# Patient Record
Sex: Female | Born: 1960 | Race: White | Hispanic: No | Marital: Single | Smoking: Former smoker
Health system: Southern US, Community
[De-identification: ages and names within clinical notes are randomized; demographics above are authoritative.]

## PROBLEM LIST (undated history)

## (undated) DIAGNOSIS — I2510 Atherosclerotic heart disease of native coronary artery without angina pectoris: Secondary | ICD-10-CM

## (undated) DIAGNOSIS — I10 Essential (primary) hypertension: Secondary | ICD-10-CM

## (undated) DIAGNOSIS — T7840XA Allergy, unspecified, initial encounter: Secondary | ICD-10-CM

## (undated) DIAGNOSIS — I700 Atherosclerosis of aorta: Secondary | ICD-10-CM

## (undated) DIAGNOSIS — J45909 Unspecified asthma, uncomplicated: Secondary | ICD-10-CM

## (undated) DIAGNOSIS — K219 Gastro-esophageal reflux disease without esophagitis: Secondary | ICD-10-CM

## (undated) DIAGNOSIS — M1990 Unspecified osteoarthritis, unspecified site: Secondary | ICD-10-CM

## (undated) DIAGNOSIS — F419 Anxiety disorder, unspecified: Secondary | ICD-10-CM

## (undated) DIAGNOSIS — F32A Depression, unspecified: Secondary | ICD-10-CM

## (undated) DIAGNOSIS — D649 Anemia, unspecified: Secondary | ICD-10-CM

## (undated) DIAGNOSIS — M810 Age-related osteoporosis without current pathological fracture: Secondary | ICD-10-CM

## (undated) DIAGNOSIS — Z8719 Personal history of other diseases of the digestive system: Secondary | ICD-10-CM

## (undated) DIAGNOSIS — J449 Chronic obstructive pulmonary disease, unspecified: Secondary | ICD-10-CM

## (undated) DIAGNOSIS — F1910 Other psychoactive substance abuse, uncomplicated: Secondary | ICD-10-CM

## (undated) DIAGNOSIS — F1490 Cocaine use, unspecified, uncomplicated: Secondary | ICD-10-CM

## (undated) HISTORY — PX: CERVICAL SPINE SURGERY: SHX589

## (undated) HISTORY — DX: Personal history of other diseases of the digestive system: Z87.19

## (undated) HISTORY — DX: Allergy, unspecified, initial encounter: T78.40XA

## (undated) HISTORY — PX: CHOLECYSTECTOMY: SHX55

## (undated) HISTORY — DX: Age-related osteoporosis without current pathological fracture: M81.0

## (undated) HISTORY — DX: Unspecified osteoarthritis, unspecified site: M19.90

## (undated) HISTORY — DX: Anxiety disorder, unspecified: F41.9

## (undated) HISTORY — DX: Depression, unspecified: F32.A

## (undated) HISTORY — DX: Anemia, unspecified: D64.9

## (undated) HISTORY — PX: TUBAL LIGATION: SHX77

## (undated) HISTORY — DX: Gastro-esophageal reflux disease without esophagitis: K21.9

---

## 2011-01-15 HISTORY — PX: CHOLECYSTECTOMY: SHX55

## 2011-01-15 HISTORY — PX: CERVICAL SPINE SURGERY: SHX589

## 2013-03-13 ENCOUNTER — Emergency Department: Payer: Self-pay | Admitting: Emergency Medicine

## 2013-04-16 ENCOUNTER — Inpatient Hospital Stay: Payer: Self-pay | Admitting: Internal Medicine

## 2013-04-16 LAB — URINALYSIS, COMPLETE
BLOOD: NEGATIVE
Bilirubin,UR: NEGATIVE
Glucose,UR: NEGATIVE mg/dL (ref 0–75)
Ketone: NEGATIVE
Nitrite: POSITIVE
PH: 5 (ref 4.5–8.0)
Protein: NEGATIVE
SPECIFIC GRAVITY: 1.009 (ref 1.003–1.030)
Squamous Epithelial: 1

## 2013-04-16 LAB — CBC WITH DIFFERENTIAL/PLATELET
Basophil #: 0.1 10*3/uL (ref 0.0–0.1)
Basophil %: 0.9 %
EOS ABS: 0.1 10*3/uL (ref 0.0–0.7)
Eosinophil %: 0.7 %
HCT: 39.9 % (ref 35.0–47.0)
HGB: 13 g/dL (ref 12.0–16.0)
LYMPHS ABS: 1.6 10*3/uL (ref 1.0–3.6)
Lymphocyte %: 14.4 %
MCH: 27.3 pg (ref 26.0–34.0)
MCHC: 32.5 g/dL (ref 32.0–36.0)
MCV: 84 fL (ref 80–100)
MONO ABS: 1.3 x10 3/mm — AB (ref 0.2–0.9)
Monocyte %: 11.7 %
NEUTROS ABS: 8 10*3/uL — AB (ref 1.4–6.5)
NEUTROS PCT: 72.3 %
PLATELETS: 207 10*3/uL (ref 150–440)
RBC: 4.76 10*6/uL (ref 3.80–5.20)
RDW: 13.4 % (ref 11.5–14.5)
WBC: 11.1 10*3/uL — ABNORMAL HIGH (ref 3.6–11.0)

## 2013-04-16 LAB — COMPREHENSIVE METABOLIC PANEL
ALBUMIN: 3.4 g/dL (ref 3.4–5.0)
ALT: 57 U/L (ref 12–78)
Alkaline Phosphatase: 103 U/L
Anion Gap: 6 — ABNORMAL LOW (ref 7–16)
BUN: 7 mg/dL (ref 7–18)
Bilirubin,Total: 0.7 mg/dL (ref 0.2–1.0)
Calcium, Total: 8.8 mg/dL (ref 8.5–10.1)
Chloride: 104 mmol/L (ref 98–107)
Co2: 27 mmol/L (ref 21–32)
Creatinine: 0.73 mg/dL (ref 0.60–1.30)
EGFR (African American): >60
EGFR (Non-African Amer.): >60
GLUCOSE: 109 mg/dL — AB (ref 65–99)
Osmolality: 272 (ref 275–301)
Potassium: 3.6 mmol/L (ref 3.5–5.1)
SGOT(AST): 38 U/L — ABNORMAL HIGH (ref 15–37)
Sodium: 137 mmol/L (ref 136–145)
Total Protein: 7.2 g/dL (ref 6.4–8.2)

## 2013-04-16 LAB — LIPASE, BLOOD: Lipase: 138 U/L (ref 73–393)

## 2013-04-17 LAB — CBC WITH DIFFERENTIAL/PLATELET
BASOS ABS: 0.1 10*3/uL (ref 0.0–0.1)
Basophil %: 0.8 %
EOS PCT: 0.7 %
Eosinophil #: 0.1 10*3/uL (ref 0.0–0.7)
HCT: 36.7 % (ref 35.0–47.0)
HGB: 11.9 g/dL — ABNORMAL LOW (ref 12.0–16.0)
LYMPHS ABS: 3 10*3/uL (ref 1.0–3.6)
LYMPHS PCT: 23.3 %
MCH: 27.8 pg (ref 26.0–34.0)
MCHC: 32.6 g/dL (ref 32.0–36.0)
MCV: 85 fL (ref 80–100)
MONO ABS: 2.1 x10 3/mm — AB (ref 0.2–0.9)
Monocyte %: 16 %
Neutrophil #: 7.7 10*3/uL — ABNORMAL HIGH (ref 1.4–6.5)
Neutrophil %: 59.2 %
PLATELETS: 186 10*3/uL (ref 150–440)
RBC: 4.3 10*6/uL (ref 3.80–5.20)
RDW: 13.5 % (ref 11.5–14.5)
WBC: 13 10*3/uL — ABNORMAL HIGH (ref 3.6–11.0)

## 2013-04-17 LAB — BASIC METABOLIC PANEL
Anion Gap: 2 — ABNORMAL LOW (ref 7–16)
BUN: 6 mg/dL — ABNORMAL LOW (ref 7–18)
CHLORIDE: 99 mmol/L (ref 98–107)
Calcium, Total: 7.8 mg/dL — ABNORMAL LOW (ref 8.5–10.1)
Co2: 33 mmol/L — ABNORMAL HIGH (ref 21–32)
Creatinine: 0.81 mg/dL (ref 0.60–1.30)
EGFR (Non-African Amer.): >60
Glucose: 102 mg/dL — ABNORMAL HIGH (ref 65–99)
Osmolality: 266 (ref 275–301)
Potassium: 3.4 mmol/L — ABNORMAL LOW (ref 3.5–5.1)
Sodium: 134 mmol/L — ABNORMAL LOW (ref 136–145)

## 2013-04-18 LAB — BASIC METABOLIC PANEL
Anion Gap: 4 — ABNORMAL LOW (ref 7–16)
BUN: 4 mg/dL — AB (ref 7–18)
CALCIUM: 7.8 mg/dL — AB (ref 8.5–10.1)
CHLORIDE: 101 mmol/L (ref 98–107)
CO2: 31 mmol/L (ref 21–32)
CREATININE: 0.65 mg/dL (ref 0.60–1.30)
Glucose: 109 mg/dL — ABNORMAL HIGH (ref 65–99)
OSMOLALITY: 269 (ref 275–301)
POTASSIUM: 3.3 mmol/L — AB (ref 3.5–5.1)
Sodium: 136 mmol/L (ref 136–145)

## 2013-04-18 LAB — CBC WITH DIFFERENTIAL/PLATELET
BASOS PCT: 0.3 %
Basophil #: 0 10*3/uL (ref 0.0–0.1)
Eosinophil #: 0 10*3/uL (ref 0.0–0.7)
Eosinophil %: 0.4 %
HCT: 29.4 % — AB (ref 35.0–47.0)
HGB: 9.9 g/dL — AB (ref 12.0–16.0)
Lymphocyte #: 1.2 10*3/uL (ref 1.0–3.6)
Lymphocyte %: 17.1 %
MCH: 28.3 pg (ref 26.0–34.0)
MCHC: 33.5 g/dL (ref 32.0–36.0)
MCV: 84 fL (ref 80–100)
MONOS PCT: 12 %
Monocyte #: 0.8 x10 3/mm (ref 0.2–0.9)
NEUTROS ABS: 4.9 10*3/uL (ref 1.4–6.5)
Neutrophil %: 70.2 %
Platelet: 143 10*3/uL — ABNORMAL LOW (ref 150–440)
RBC: 3.49 10*6/uL — ABNORMAL LOW (ref 3.80–5.20)
RDW: 13.1 % (ref 11.5–14.5)
WBC: 7 10*3/uL (ref 3.6–11.0)

## 2013-04-18 LAB — URINE CULTURE

## 2013-04-20 LAB — BASIC METABOLIC PANEL
ANION GAP: 1 — AB (ref 7–16)
BUN: 6 mg/dL — ABNORMAL LOW (ref 7–18)
CREATININE: 0.61 mg/dL (ref 0.60–1.30)
Calcium, Total: 7.9 mg/dL — ABNORMAL LOW (ref 8.5–10.1)
Chloride: 107 mmol/L (ref 98–107)
Co2: 34 mmol/L — ABNORMAL HIGH (ref 21–32)
EGFR (African American): >60
EGFR (Non-African Amer.): >60
Glucose: 89 mg/dL (ref 65–99)
OSMOLALITY: 280 (ref 275–301)
Potassium: 3.4 mmol/L — ABNORMAL LOW (ref 3.5–5.1)
SODIUM: 142 mmol/L (ref 136–145)

## 2013-04-20 LAB — CBC WITH DIFFERENTIAL/PLATELET
BASOS PCT: 0.9 %
Basophil #: 0 10*3/uL (ref 0.0–0.1)
Eosinophil #: 0.1 10*3/uL (ref 0.0–0.7)
Eosinophil %: 2.3 %
HCT: 29.4 % — AB (ref 35.0–47.0)
HGB: 9.8 g/dL — ABNORMAL LOW (ref 12.0–16.0)
LYMPHS PCT: 31.5 %
Lymphocyte #: 1.2 10*3/uL (ref 1.0–3.6)
MCH: 28.3 pg (ref 26.0–34.0)
MCHC: 33.5 g/dL (ref 32.0–36.0)
MCV: 85 fL (ref 80–100)
Monocyte #: 0.5 x10 3/mm (ref 0.2–0.9)
Monocyte %: 13.1 %
NEUTROS PCT: 52.2 %
Neutrophil #: 2 10*3/uL (ref 1.4–6.5)
Platelet: 198 10*3/uL (ref 150–440)
RBC: 3.47 10*6/uL — ABNORMAL LOW (ref 3.80–5.20)
RDW: 12.9 % (ref 11.5–14.5)
WBC: 3.9 10*3/uL (ref 3.6–11.0)

## 2013-04-21 LAB — CBC WITH DIFFERENTIAL/PLATELET
Basophil #: 0 10*3/uL (ref 0.0–0.1)
Basophil %: 0.6 %
EOS PCT: 1.8 %
Eosinophil #: 0.1 10*3/uL (ref 0.0–0.7)
HCT: 30.6 % — AB (ref 35.0–47.0)
HGB: 10.4 g/dL — ABNORMAL LOW (ref 12.0–16.0)
Lymphocyte #: 1 10*3/uL (ref 1.0–3.6)
Lymphocyte %: 25.5 %
MCH: 28.2 pg (ref 26.0–34.0)
MCHC: 33.9 g/dL (ref 32.0–36.0)
MCV: 83 fL (ref 80–100)
Monocyte #: 0.5 x10 3/mm (ref 0.2–0.9)
Monocyte %: 12.2 %
NEUTROS ABS: 2.3 10*3/uL (ref 1.4–6.5)
Neutrophil %: 59.9 %
PLATELETS: 228 10*3/uL (ref 150–440)
RBC: 3.68 10*6/uL — AB (ref 3.80–5.20)
RDW: 12.9 % (ref 11.5–14.5)
WBC: 3.8 10*3/uL (ref 3.6–11.0)

## 2013-04-21 LAB — BASIC METABOLIC PANEL
ANION GAP: 4 — AB (ref 7–16)
BUN: 4 mg/dL — AB (ref 7–18)
CO2: 32 mmol/L (ref 21–32)
CREATININE: 0.63 mg/dL (ref 0.60–1.30)
Calcium, Total: 8.2 mg/dL — ABNORMAL LOW (ref 8.5–10.1)
Chloride: 105 mmol/L (ref 98–107)
EGFR (African American): >60
GLUCOSE: 110 mg/dL — AB (ref 65–99)
Osmolality: 279 (ref 275–301)
Potassium: 3.3 mmol/L — ABNORMAL LOW (ref 3.5–5.1)
SODIUM: 141 mmol/L (ref 136–145)

## 2013-04-21 LAB — MAGNESIUM: Magnesium: 1.6 mg/dL — ABNORMAL LOW

## 2013-04-21 LAB — CULTURE, BLOOD (SINGLE)

## 2013-04-22 LAB — HEMOGLOBIN: HGB: 10.9 g/dL — ABNORMAL LOW (ref 12.0–16.0)

## 2013-04-26 LAB — PATHOLOGY REPORT

## 2013-04-29 ENCOUNTER — Ambulatory Visit: Payer: Self-pay | Admitting: Surgery

## 2013-04-29 LAB — CBC WITH DIFFERENTIAL/PLATELET
BASOS PCT: 0.6 %
Basophil #: 0.1 10*3/uL (ref 0.0–0.1)
Eosinophil #: 0.1 10*3/uL (ref 0.0–0.7)
Eosinophil %: 1.2 %
HCT: 41.5 % (ref 35.0–47.0)
HGB: 13.7 g/dL (ref 12.0–16.0)
LYMPHS ABS: 2.2 10*3/uL (ref 1.0–3.6)
LYMPHS PCT: 24.3 %
MCH: 27.8 pg (ref 26.0–34.0)
MCHC: 32.9 g/dL (ref 32.0–36.0)
MCV: 85 fL (ref 80–100)
Monocyte #: 1 x10 3/mm — ABNORMAL HIGH (ref 0.2–0.9)
Monocyte %: 11.2 %
Neutrophil #: 5.6 10*3/uL (ref 1.4–6.5)
Neutrophil %: 62.7 %
Platelet: 326 10*3/uL (ref 150–440)
RBC: 4.91 10*6/uL (ref 3.80–5.20)
RDW: 14 % (ref 11.5–14.5)
WBC: 9 10*3/uL (ref 3.6–11.0)

## 2013-04-29 LAB — BASIC METABOLIC PANEL
Anion Gap: 6 — ABNORMAL LOW (ref 7–16)
BUN: 10 mg/dL (ref 7–18)
CALCIUM: 8.7 mg/dL (ref 8.5–10.1)
CHLORIDE: 104 mmol/L (ref 98–107)
CO2: 32 mmol/L (ref 21–32)
CREATININE: 0.74 mg/dL (ref 0.60–1.30)
EGFR (Non-African Amer.): >60
GLUCOSE: 102 mg/dL — AB (ref 65–99)
OSMOLALITY: 282 (ref 275–301)
Potassium: 4.3 mmol/L (ref 3.5–5.1)
SODIUM: 142 mmol/L (ref 136–145)

## 2013-05-03 ENCOUNTER — Ambulatory Visit: Payer: Self-pay | Admitting: Surgery

## 2014-05-07 NOTE — Discharge Summary (Signed)
PATIENT NAME:  Stacy SartoriusWHITTAKER, Jonia MR#:  [id number] DATE OF BIRTH:  05/16/1960  DATE OF ADMISSION:  04/16/2013 DATE OF DISCHARGE:  04/22/2013  ADMISSION DIAGNOSIS: Sigmoid diverticulitis.   DISCHARGE DIAGNOSES: 1.  Sigmoid diverticulitis.  2.  Gastritis.  3.  History of chronic obstructive pulmonary disease. 4.  Escherichia coli urinary tract infection.   CONSULTATIONS: 1.  Dr. Michela PitcherEly. 2.  Gastroenterology.   PERTINENT PROCEDURES: 1.  The patient underwent EGD on 04/21/2013 which showed gastritis, normal esophagus and normal duodenum.  2.  CT scan on 04/20/2013, showed improvement of her sigmoid diverticulitis. No abscess was seen.   HOSPITAL COURSE: A [age] year old female who presented with nausea, vomiting and abdominal pain, found to have a complicated diverticulitis on CT scan on admission. For further details, please refer to H and P.  1.  Diverticulitis of the sigmoid. The patient was admitted and placed on IV antibiotics. Surgery was consulted due to her abdominal pain and sigmoid diverticulitis. She initially had some increasing pain and there was question if she would need surgery. However, as throughout the hospital course went, she actually had improved, tolerating a regular diet. We repeated a CT scan on the 7th with results as stated above; it is actually improving. She at times had this weird right upper quadrant abdominal pain and also had 1 episode of melena so therefore GI was consulted as well. The patient was continued on Levaquin and Flagyl, need 10 days of antibiotics and will see surgery as an outpatient.  2.  Melena with right-sided pain. The patient has a history of ulcers.  Gastroenterology was consulted. EGD showed some gastritis. She will continue on proton pump inhibitor.  3.  Escherichia coli urinary tract infection. The patient will continue on Levaquin.  4.  Chronic obstructive pulmonary disease/asthma, stable, not in any exacerbation.  5.  Tobacco use disorder. The  patient was counseled by the admitting MD physician.  DISCHARGE MEDICATIONS: 1.  Levaquin 500 mg daily for 10 days.  2.  Flagyl 500 mg p.o. q.8 hours x 10 days.  3.  Pantoprazole 40 mg daily.  4.  Nicotine patch 21 mg per 24 hours. 5.  Albuterol 2 puffs q.4 hours p.r.n.   DISCHARGE DIET: Regular diet.   DISCHARGE ACTIVITY: As tolerated.  DISCHARGE FOLLOWUP: The patient will follow up with Dr. Michela PitcherEly in 1 to 2 weeks. The patient was also given referral to [facility].   The patient was medically stable for discharge. Plan of care was discussed with the patient.   TIME SPENT: Approximately 35 minutes.   ____________________________ Janyth ContesSital P. Juliene PinaMody, MD spm:ce D: 04/22/2013 14:20:08 ET T: 04/22/2013 16:34:49 ET JOB#: [id number]  cc: Deyjah Kindel P. Juliene PinaMody, MD, <Dictator> Quentin Orealph L. Ely III, MD [facility] Liliya Fullenwider P Terianne Thaker MD ELECTRONICALLY SIGNED 04/23/2013 12:50

## 2014-05-07 NOTE — H&P (Signed)
PATIENT NAME:  Stacy Gilmore, Stacy Gilmore MR#:  [id number] DATE OF BIRTH:  1960/08/10  DATE OF ADMISSION:  04/16/2013  ADMITTING PHYSICIAN: Enid Baasadhika Ziyanna Tolin, MD  PRIMARY CARE PHYSICIAN: None.   CHIEF COMPLAINT: Abdominal pain.   HISTORY OF PRESENT ILLNESS: Stacy Gilmore is a 54 year old Caucasian female who is homeless, from a homeless shelter, with past medical history significant for COPD, asthma, but not taking any medications, does not have a primary care physician, who presents to the hospital feeling sick, nausea, vomiting and diarrhea for 3 days now. She was also having extensive left lower quadrant abdominal pain that worsened since last night, that she could not get comfortable and presents to the hospital. She says she was feeling fine up until 3 days ago, then it started with nausea and some diarrhea. She waited for a day to see if it would clear off. She was having some chills and feeling cold all day long yesterday, and then she started to have this left lower quadrant intense pain. Denies any bloody stools or melena. No hematemesis. Never had this kind of episode before. Lab work in the ER reveals that she does have significant urinary tract infection, and also CT of the abdomen and pelvis confirming acute diverticulitis with possible microperforation in the descending colon. So, she is being admitted for the same.   PAST MEDICAL HISTORY: COPD/asthma.   PAST SURGICAL HISTORY:  1. Cholecystectomy.  2. Tubal ligation.  3. C-spine fusion surgery.   ALLERGIES TO MEDICATIONS: CODEINE, PENICILLIN WITH ANAPHYLACTIC REACTION, BEE STINGS.   CURRENT HOME MEDICATIONS: None.   SOCIAL HISTORY: From a homeless shelter. Smokes about 1 pack per day. No alcohol or other drug use.   FAMILY HISTORY: Does not know about her dad, but mom had lung cancer and anxiety issues.   REVIEW OF SYSTEMS:  CONSTITUTIONAL: Positive for fever and cold chills. No fatigue or weakness.  EYES: No blurred vision, double  vision, inflammation or glaucoma.  ENT: No tinnitus, ear pain, hearing loss, epistaxis or discharge. Positive for sinus fullness and sinus headaches.  RESPIRATORY: Positive for chronic cough. No wheeze, hemoptysis. Positive for COPD.  CARDIOVASCULAR: No chest pain, orthopnea, edema, arrhythmia, palpitations or syncope.  GASTROINTESTINAL: Positive for nausea, vomiting and diarrhea. Positive for abdominal pain. No hematemesis or melena.  GENITOURINARY: No dysuria, hematuria, renal calculus, frequency or incontinence.  ENDOCRINE: No polyuria, nocturia, thyroid problems, heat or cold intolerance.  HEMATOLOGY: No anemia, easy bruising or bleeding.  SKIN: No acne, rash or lesions.  MUSCULOSKELETAL: No neck, back, shoulder pain, arthritis or gout.  NEUROLOGIC: No numbness, weakness, CVA, TIA or seizures.  PSYCHOLOGICAL: No anxiety, insomnia, depression.   PHYSICAL EXAMINATION:  VITAL SIGNS: Temperature 98.9 degrees Fahrenheit, pulse 85, respirations 20, blood pressure 111/61, pulse oximetry 96% on room air.  GENERAL: Well-built, well-nourished female lying in bed, in mild distress secondary to the abdominal pain.  HEENT: Normocephalic, atraumatic. Pupils equal, round and reacting to light. Anicteric sclerae. Extraocular movements intact. Oropharynx clear, without erythema, mass or exudates. NECK: Supple. No thyromegaly, JVD or carotid bruits. No lymphadenopathy. Normal range of motion without pain.  RESPIRATORY: Has some scattered expiratory wheeze. Good respiratory effort. Otherwise, moving air bilaterally. No crackles or rhonchi. No use of accessory muscles for breathing.  CARDIOVASCULAR: S1, S2, regular rate and rhythm. No murmurs, rubs or gallops.  ABDOMEN: Soft, but significant tenderness in the left lower quadrant with guarding. No rigidity or rebound tenderness. Normal bowel sounds.  EXTREMITIES: No pedal edema. No clubbing or cyanosis, 2+  dorsalis pedis pulses palpable bilaterally.  SKIN: No  acne, rash or lesions.  LYMPHATIC: No cervical or inguinal lymphadenopathy.  NEUROLOGIC: Cranial nerves II through XII remain grossly intact. No focal motor or sensory deficits.  PSYCHOLOGICAL: The patient is awake, alert, oriented x3.   LABORATORY DATA:  WBC 11.1, hemoglobin 13.0, hematocrit 39.9, platelet count 207.  Sodium 137, potassium 3.6, chloride 104, bicarbonate 27, BUN 7, creatinine 0.73 glucose 109 and calcium of 8.8.  ALT 57, AST 38, alkaline phosphatase 103, total bilirubin 0.7 and albumin of 3.4.  Lipase 138.  Urinalysis with positive nitrites, 2+ leukocyte esterase, 38 WBCs, 2+ bacteria.  CT of the abdomen and pelvis with contrast showing acute diverticulitis at the descending colon and sigmoid junction with moderate inflammatory changes. Small amount of fluid. A few dots of air in the mesentery. No definite focal abscess or free intraperitoneal air noted.   ASSESSMENT AND PLAN: This is a 54 year old female with history of chronic obstructive pulmonary disease, asthma, who comes in with acute diverticulitis and also urinary tract infection.    1. Acute sigmoid/descending colon diverticulitis with microperforation. Admit the patient. Start on clear liquid diet. Will get surgical consult. IV fluids. Pain medications and nausea medications. Will do IV Levaquin and also IV Flagyl for her diverticulitis. If continues to have diarrhea, will order stool studies.  2. Urinary tract infection. No evidence of pyelonephritis. Blood and urine cultures are sent for, on Levaquin.  3. Chronic obstructive pulmonary disease, stable, mild expiratory wheeze, which is probably baseline. Will start on albuterol inhaler while here.  4. Tobacco use disorder. Counseled for 3 minutes against smoking and starting on nicotine patch.  CODE STATUS: Full code.   TIME SPENT ON ADMISSION: 50 minutes.   ____________________________ Enid Baas, MD rk:lb D: 04/16/2013 10:40:08 ET T: 04/16/2013 11:37:42  ET JOB#: [id number]  cc: Enid Baas, MD, <Dictator> Enid Baas MD ELECTRONICALLY SIGNED 04/22/2013 14:09
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
## 2014-05-07 NOTE — Consult Note (Signed)
Brief Consult Note: Diagnosis: complicated sigmoidal diverticulitis.   Patient was seen by consultant.   Recommend further assessment or treatment.   Comments: no acute indication for operative intervention.  Electronic Signatures: Natale LayBird, Danylah Holden (MD)  (Signed 03-Apr-15 17:15)  Authored: Brief Consult Note   Last Updated: 03-Apr-15 17:15 by Natale LayBird, Ashlin Kreps (MD)

## 2014-05-07 NOTE — Consult Note (Signed)
PATIENT NAME:  Stacy Gilmore, Stacy Gilmore MR#:  [id number] DATE OF BIRTH:  1960/09/09  DATE OF CONSULTATION:  04/21/2013  CONSULTING PHYSICIAN:  Midge Miniumarren Yonathan Perrow, MD  REASON FOR CONSULTATION: Nausea.   HISTORY OF PRESENT ILLNESS: This patient is a 54 year old woman who comes in with what appears to be complicated diverticulitis, with findings on CT at admission showing acute diverticulitis of the descending/sigmoid junction with moderate regional inflammation and a small amount of fluid in the pericolic gutter and a few dots of air in the mesentery, without any definable focal abscess or free intraperitoneal air at that time. The patient was treated with antibiotics, and repeat CT scans have shown improvement of her diverticulitis. The patient continues to have nausea. Although not being a good historian, the patient had told me that she did not have nausea, then recanted and stated that she did have nausea. She also failed to tell me, when asked if she ever had pains in her abdomen before, that she had a history of peptic ulcer disease. The patient's nausea is reported to be not helped by the medication she is getting at the present time. I am being asked to see the patient for possible recurrent peptic ulcer disease as the cause of her nausea.   PAST MEDICAL HISTORY: Peptic ulcer disease, COPD, asthma.   PAST SURGICAL HISTORY: Laparoscopic cholecystectomy, tubal ligation, cervical spine fusion.   ALLERGIES: CODEINE, PENICILLIN AND BEE STINGS.   HOME MEDICATIONS: None.   SOCIAL HISTORY: Does not drink. Recently located [place].   FAMILY HISTORY: Noncontributory.   REVIEW OF SYSTEMS: A 10-point review of systems negative except what was stated above.   PHYSICAL EXAMINATION:  VITAL SIGNS: Temperature 98.9, pulse 71, respirations 18, blood pressure 114/66, pulse oximetry 92%.  GENERAL: The patient sitting in bed in no apparent distress.  HEENT: Normocephalic, atraumatic. Extraocular motor  intact. Pupils equally round and reactive to light and accommodation.  NECK: Without JVD, without lymphadenopathy.  LUNGS: Clear to auscultation bilaterally.  HEART: Regular rate and rhythm without murmurs, rubs or gallops.  ABDOMEN: Soft. There is diffuse mild tenderness on deep palpation without rebound, without guarding.  EXTREMITIES: Without cyanosis, clubbing or edema.  NEUROLOGICAL: Grossly intact.  SKIN: Without any rashes or lesions.   ANCILLARY SERVICES: White cell count 3.8, hemoglobin 10.4, hematocrit 30.6, platelets 228. CT scan as stated above.   ASSESSMENT AND PLAN: This patient is a 54 year old woman who comes in with what appears to be complicated diverticulitis. The patient has a history of peptic ulcer disease and continues to have nausea. The patient will be set up for an EGD to be done today.   Thank you very much for involving me in the care of this patient. If you have any questions, please do not hesitate to call.   ____________________________ Midge Miniumarren Benney [place], MD dw:lb D: 04/21/2013 13:56:17 ET T: 04/21/2013 14:17:34 ET JOB#: [id number]  cc: Midge Miniumarren Angeletta Goelz, MD, <Dictator> Midge MiniumARREN Nori Poland MD ELECTRONICALLY SIGNED 04/26/2013 7:20

## 2015-04-11 IMAGING — CT CT ABD-PELV W/ CM
2 of 5 series · 16 of 46 positions shown, 18 images · IV contrast (isovue)
Comparison: 04/16/2013

CLINICAL DATA: Acute diverticulitis follow-up

EXAM:
CT ABDOMEN AND PELVIS WITH CONTRAST
TECHNIQUE: Multidetector CT imaging of the abdomen and pelvis was performed
using the standard protocol following bolus administration of
intravenous contrast.
CONTRAST:  100 cc Isovue

[Series 2: routine abd pel with · axial · 0.86mm/px · z∈[-900,-490]mm · 13 of 92 slices shown, 15 images]
[im 5/92  soft-tissue]
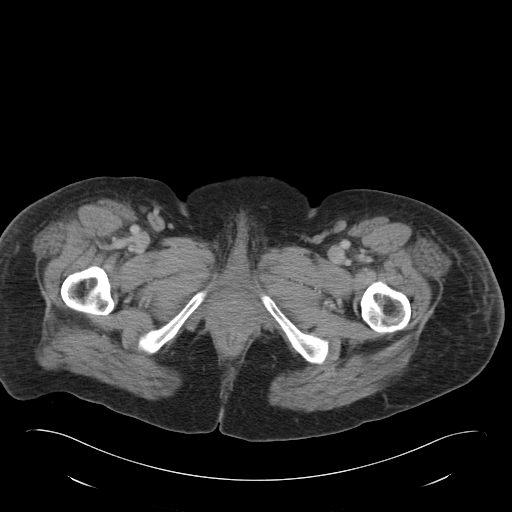
[im 5/92  bone]
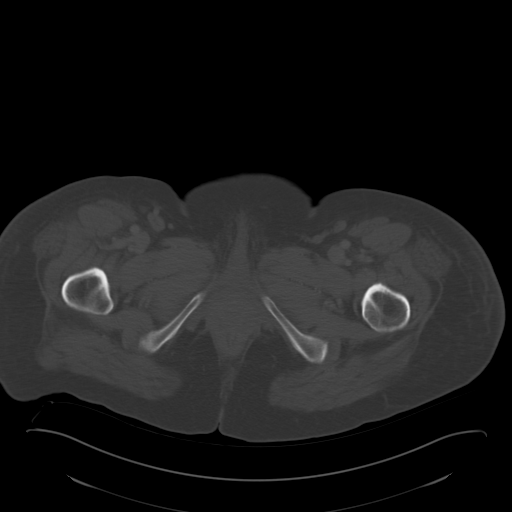
[im 14/92  soft-tissue]
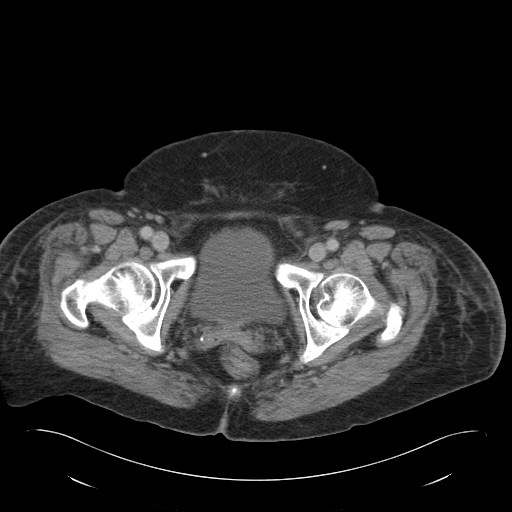
[im 19/92  soft-tissue]
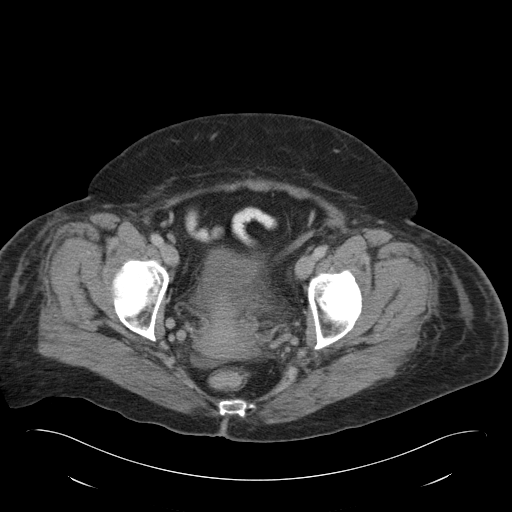
[im 28/92  soft-tissue]
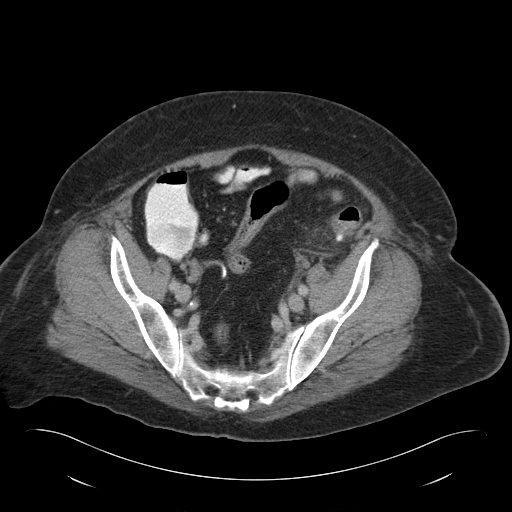
[im 32/92  soft-tissue]
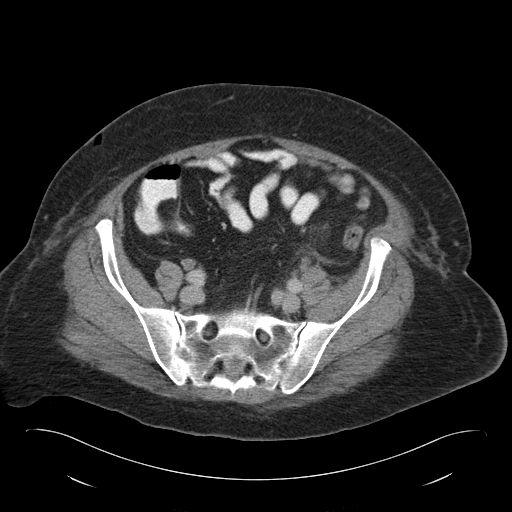
[im 41/92  soft-tissue]
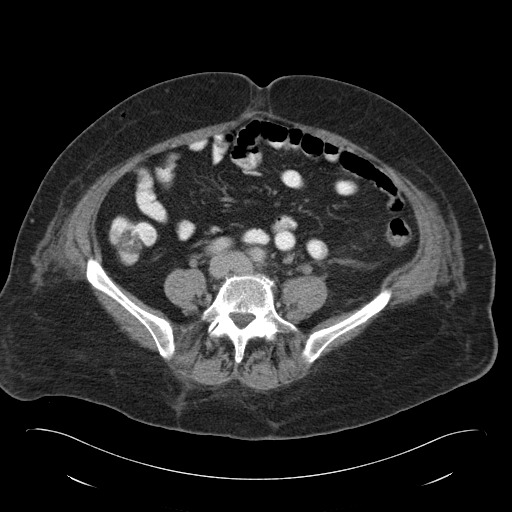
[im 46/92  soft-tissue]
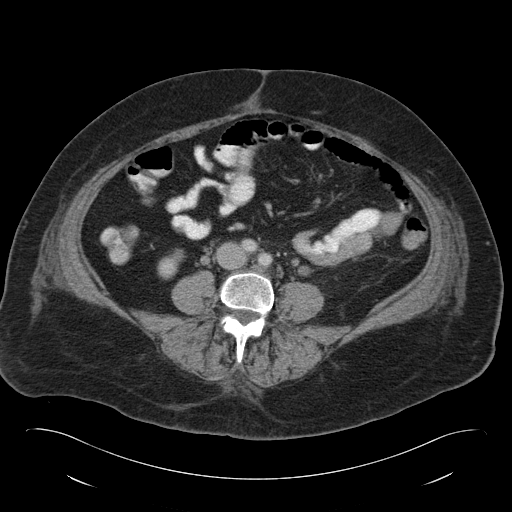
[im 51/92  soft-tissue]
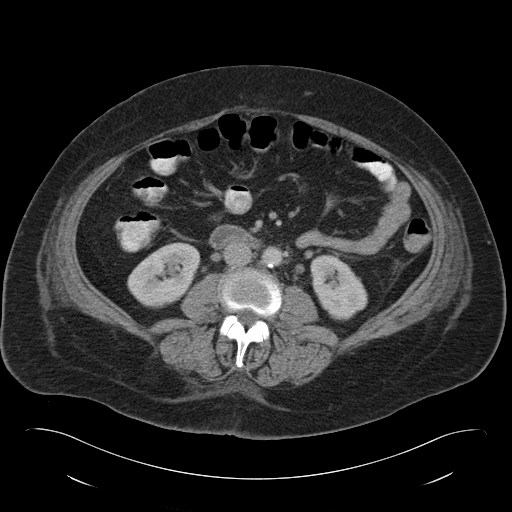
[im 60/92  soft-tissue]
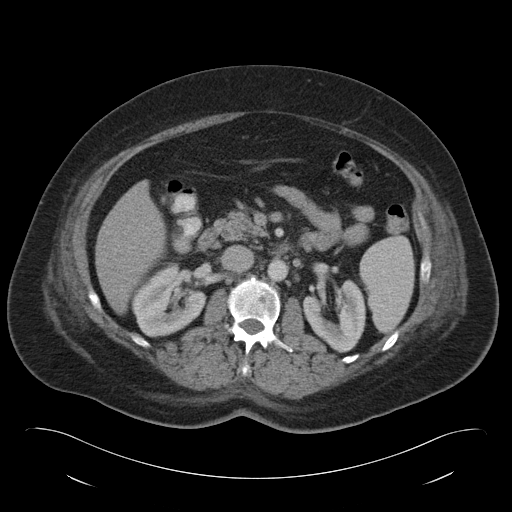
[im 60/92  bone]
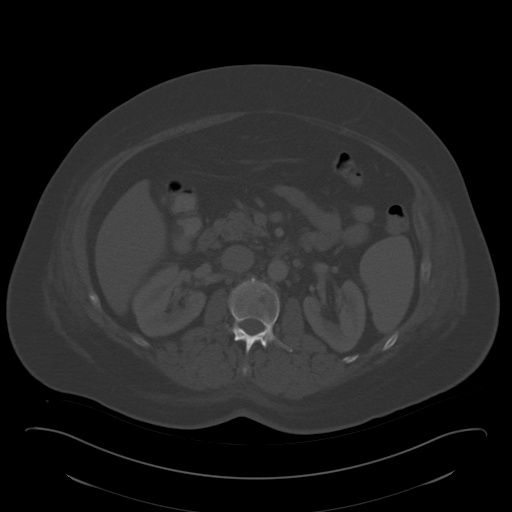
[im 64/92  soft-tissue]
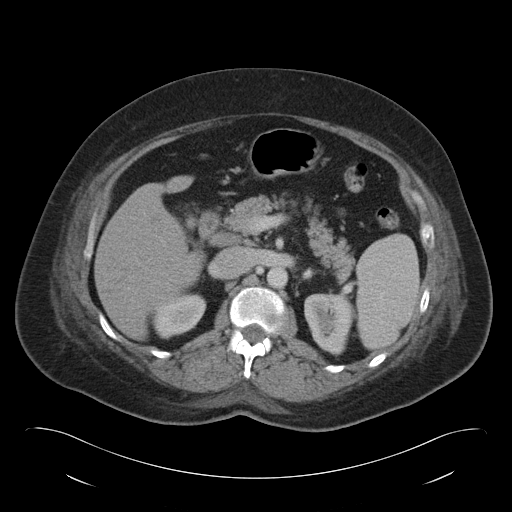
[im 73/92  soft-tissue]
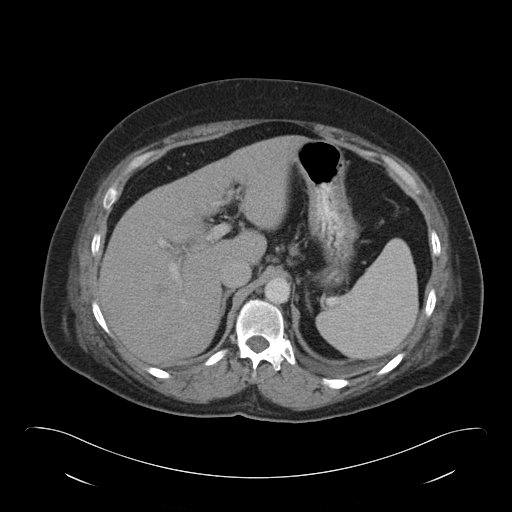
[im 78/92  soft-tissue]
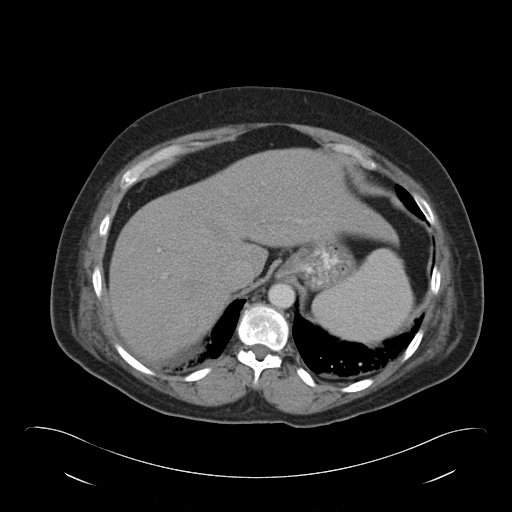
[im 87/92  soft-tissue]
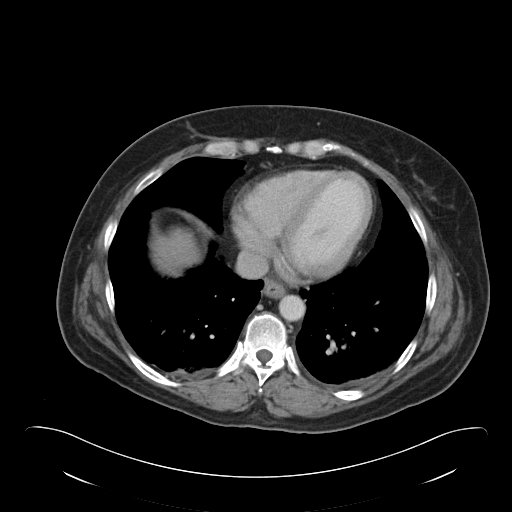

[Series 6: cor routine abd pel with · coronal · 0.85mm/px · 3 of 156 slices shown]
[im 52/156  soft-tissue]
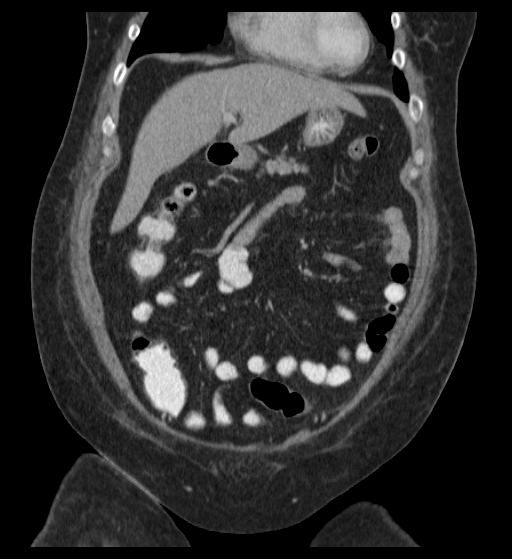
[im 69/156  soft-tissue]
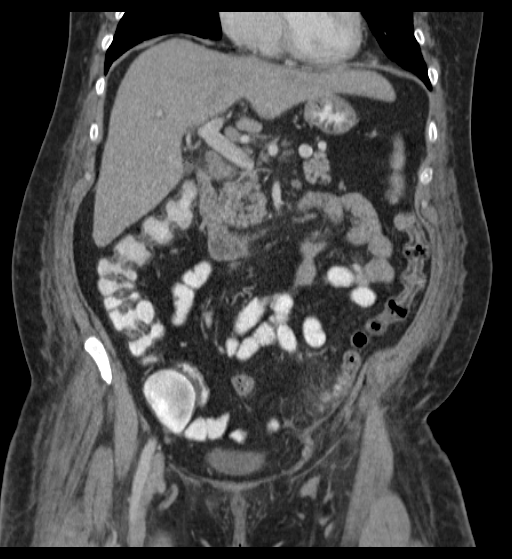
[im 87/156  soft-tissue]
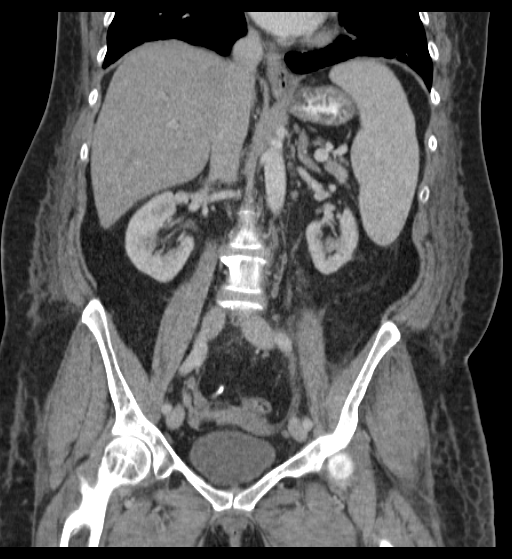

[16 of 46 positions shown; findings below may reference images not displayed]

FINDINGS: Sagittal images of the spine are unremarkable. Bilateral lung bases
posterior atelectasis. The patient is status postcholecystectomy.
Liver, pancreas, spleen and adrenal glands are unremarkable. Kidneys
are symmetrical in size and enhancement. No hydronephrosis or
hydroureter.

No aortic aneurysm.

No small bowel obstruction.  No ascites or free air.  No adenopathy.

Somewhat low lying cecum. No pericecal inflammation. Normal appendix
is clearly seen.

Again noted scattered left colon diverticula. Again noted mild
inflammatory changes in left lower quadrant surrounding descending
colon at junction with proximal sigmoid colon consistent with
improving diverticulitis. Persistent mild pericolonic residual
stranding. There is improvement in focal colonic wall thickening. No
definite extraluminal air is noted. No mesenteric or diverticular
abscess. There is less fluid in pericolonic gutter. Slight increase
pelvic free fluid. The uterus and adnexa are unremarkable. Tiny left
inguinal hernia containing fat without evidence of acute
complication. Urinary bladder is unremarkable.
IMPRESSION: 1. Again noted mild diverticulitis at the junction of descending
colon with sigmoid colon in left lower quadrant with improvement
from prior exam. There is improvement in colonic thickening at this
level. Persistent mild pericolonic inflammatory changes. Decreased
pericolonic fluid. No mesenteric abscess or diverticular abscess.
Small amount of free fluid within posterior cul-de-sac.
2. Somewhat low lying cecum. No pericecal inflammation. Normal
appendix.
3. No hydronephrosis or hydroureter.
4. Status postcholecystectomy.

## 2020-04-23 ENCOUNTER — Encounter: Payer: Self-pay | Admitting: Emergency Medicine

## 2020-04-23 ENCOUNTER — Emergency Department: Payer: Self-pay

## 2020-04-23 ENCOUNTER — Emergency Department
Admission: EM | Admit: 2020-04-23 | Discharge: 2020-04-23 | Disposition: A | Discharge status: Home / Self Care | Payer: Self-pay | Attending: Emergency Medicine | Admitting: Emergency Medicine

## 2020-04-23 DIAGNOSIS — S42292A Other displaced fracture of upper end of left humerus, initial encounter for closed fracture: Secondary | ICD-10-CM | POA: Insufficient documentation

## 2020-04-23 DIAGNOSIS — F17210 Nicotine dependence, cigarettes, uncomplicated: Secondary | ICD-10-CM | POA: Insufficient documentation

## 2020-04-23 MED ORDER — OXYCODONE-ACETAMINOPHEN 5-325 MG PO TABS
1.0000 | ORAL_TABLET | Freq: Once | ORAL | Status: AC
  Administered 2020-04-23: 1 via ORAL
  Filled 2020-04-23: qty 1

## 2020-04-23 MED ORDER — KETOROLAC TROMETHAMINE 60 MG/2ML IM SOLN
15.0000 mg | Freq: Once | INTRAMUSCULAR | Status: AC
  Administered 2020-04-23: 15 mg via INTRAMUSCULAR
  Filled 2020-04-23: qty 2

## 2020-04-23 MED ORDER — MORPHINE SULFATE (PF) 2 MG/ML IV SOLN
2.0000 mg | Freq: Once | INTRAVENOUS | Status: AC
  Administered 2020-04-23: 2 mg via INTRAMUSCULAR
  Filled 2020-04-23: qty 1

## 2020-04-23 MED ORDER — OXYCODONE-ACETAMINOPHEN 5-325 MG PO TABS: 1.0000 | ORAL_TABLET | Freq: Four times a day (QID) | ORAL | 0 refills | Status: AC | PRN

## 2020-04-23 NOTE — Discharge Instructions (Addendum)
Please take percocet once every 6 hours as needed. You may also use Tylenol, up to 650mg  in addition to the percocet every 6 hours. Keep the shoulder in the immobilizer. Follow up with orthopedics.
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
## 2020-04-23 NOTE — ED Notes (Signed)
Patient transported to X-ray 

## 2020-04-23 NOTE — ED Notes (Signed)
Patient is alert and oriented x4, ambulatory, and is upset about her pain. Will continue to monitor and assess.

## 2020-04-23 NOTE — ED Provider Notes (Incomplete)
[facility] Emergency Department Provider Note  {** REMINDER - THIS NOTE IS NOT A FINAL MEDICAL RECORD UNTIL IT IS SIGNED.  UNTIL THEN, THE CONTENT BELOW MAY REFLECT INFORMATION FROM A DOCUMENTATION TEMPLATE, NOT THE ACTUAL PATIENT VISIT. **} ____________________________________________   Event Date/Time   First MD Initiated Contact with Patient 04/23/20 2138     (approximate)  I have reviewed the triage vital signs and the nursing notes.   HISTORY  Chief Complaint Arm Pain  {**Delete this block, or insert here any limitations to your history or physical exam, such as chronic dementia, altered mental status, severe respiratory distress, intoxication, etc.**}  HPI Stacy Gilmore is a 60 y.o. female ***        {**SYMPTOM/COMPLAINT  LOCATION (describe anatomically) DURATION (when did it start) TIMING (onset and pattern) SEVERITY (0-10, mild/moderate/severe) QUALITY (description of symptoms) CONTEXT (recent surgery, new meds, activity, etc.) MODIFYINGFACTORS (what makes it better/worse) ASSOCIATEDSYMPTOMS (pertinent positives and negatives)**} History reviewed. No pertinent past medical history.  There are no problems to display for this patient.   History reviewed. No pertinent surgical history.  Prior to Admission medications   Not on File    Allergies Bee venom, Penicillins, and Armoracia rusticana ext (horseradish)  History reviewed. No pertinent family history.  Social History Social History   Tobacco Use  . Smoking status: Current Every Day Smoker    Packs/day: 0.30  . Smokeless tobacco: Never Used  Substance Use Topics  . Alcohol use: Yes    Comment: 1.5 40oz  . Drug use: Never    Review of Systems {** Revise as appropriate then delete this line - Documentation of 10 systems is required  **} Constitutional: No fever/chills Eyes: No visual changes. ENT: No sore throat. Cardiovascular: Denies chest pain. Respiratory:  Denies shortness of breath. Gastrointestinal: No abdominal pain.  No nausea, no vomiting.  No diarrhea.  No constipation. Genitourinary: Negative for dysuria. Musculoskeletal: Negative for back pain. Skin: Negative for rash. Neurological: Negative for headaches, focal weakness or numbness. {**Psychiatric:  Endocrine:  Hematological/Lymphatic:  Allergic/Immunilogical: **}  ____________________________________________   PHYSICAL EXAM:  VITAL SIGNS: ED Triage Vitals  Enc Vitals Group     BP 04/23/20 2025 126/88     Pulse Rate 04/23/20 2025 77     Resp 04/23/20 2025 18     Temp 04/23/20 2025 98.1 F (36.7 C)     Temp Source 04/23/20 2025 Oral     SpO2 04/23/20 2025 96 %     Weight 04/23/20 2024 175 lb (79.4 kg)     Height 04/23/20 2024 5\' 8"  (1.727 m)     Head Circumference --      Peak Flow --      Pain Score 04/23/20 2032 10     Pain Loc --      Pain Edu? --      Excl. in GC? --    {** Revise as appropriate then delete this line - 8 systems required **} Constitutional: Alert and oriented. Well appearing and in no acute distress. Eyes: Conjunctivae are normal. PERRL. EOMI. Head: Atraumatic. Nose: No congestion/rhinnorhea. Mouth/Throat: Mucous membranes are moist.  Oropharynx non-erythematous. Neck: No stridor.  {**No cervical spine tenderness to palpation.**} {**Hematological/Lymphatic/Immunilogical: No cervical lymphadenopathy. **}Cardiovascular: Normal rate, regular rhythm. Grossly normal heart sounds.  Good peripheral circulation. Respiratory: Normal respiratory effort.  No retractions. Lungs CTAB. Gastrointestinal: Soft and nontender. No distention. No abdominal bruits. No CVA tenderness. {**Genitourinary:  **}Musculoskeletal: No lower extremity tenderness nor  edema.  No joint effusions. Neurologic:  Normal speech and language. No gross focal neurologic deficits are appreciated. No gait instability. Skin:  Skin is warm, dry and intact. No rash noted. Psychiatric:  Mood and affect are normal. Speech and behavior are normal.  ____________________________________________   LABS (all labs ordered are listed, but only abnormal results are displayed)  Labs Reviewed - No data to display ____________________________________________  EKG  *** ____________________________________________  RADIOLOGY I, Lucy Chris, personally viewed and evaluated these images (plain radiographs) as part of my medical decision making, as well as reviewing the written report by the radiologist.  ED MD interpretation:  ***  Official radiology report(s): DG Shoulder Left  Result Date: 04/23/2020 CLINICAL DATA:  Status post fall. EXAM: LEFT SHOULDER - 2+ VIEW COMPARISON:  None. FINDINGS: An acute, comminuted fracture deformity is seen involving the head, surgical neck and proximal shaft of the proximal left humerus. No significant displacement of the multiple fracture fragments is seen. There is no evidence of dislocation. There is no evidence of arthropathy or other focal bone abnormality. Radiopaque surgical screws are seen within the visualized portion of the cervical spine. Soft tissues are unremarkable. IMPRESSION: Acute, comminuted fracture of the proximal left humerus. Electronically Signed   By: Aram Candela M.D.   On: 04/23/2020 21:58   DG Humerus Left  Result Date: 04/23/2020 CLINICAL DATA:  Status post fall. EXAM: LEFT HUMERUS - 2+ VIEW COMPARISON:  None. FINDINGS: There is an acute, comminuted fracture deformity involving the head, surgical neck and proximal shaft of the left humerus. Multiple mildly displaced fracture fragments are seen. There is no evidence of dislocation. Soft tissues are unremarkable. IMPRESSION: Acute fracture of the proximal left humerus. Electronically Signed   By: Aram Candela M.D.   On: 04/23/2020 22:01    ____________________________________________   PROCEDURES  Procedure(s) performed (including Critical  Care):  Procedures   ____________________________________________   INITIAL IMPRESSION / ASSESSMENT AND PLAN / ED COURSE  As part of my medical decision making, I reviewed the following data within the electronic MEDICAL RECORD NUMBER {[id number]::"Notes from prior ED visits","[place] Controlled Substance Database"}        ***      ____________________________________________   FINAL CLINICAL IMPRESSION(S) / ED DIAGNOSES  Final diagnoses:  None     ED Discharge Orders    None      *Please note:  Stacy Gilmore was evaluated in Emergency Department on 04/23/2020 for the symptoms described in the history of present illness. She was evaluated in the context of the global COVID-19 pandemic, which necessitated consideration that the patient might be at risk for infection with the SARS-CoV-2 virus that causes COVID-19. Institutional protocols and algorithms that pertain to the evaluation of patients at risk for COVID-19 are in a state of rapid change based on information released by regulatory bodies including the CDC and federal and state organizations. These policies and algorithms were followed during the patient's care in the ED.  Some ED evaluations and interventions may be delayed as a result of limited staffing during and the pandemic.*   Note:  This document was prepared using Dragon voice recognition software and may include unintentional dictation errors.
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
## 2021-11-18 ENCOUNTER — Emergency Department
Admission: EM | Admit: 2021-11-18 | Discharge: 2021-11-18 | Disposition: A | Discharge status: Home / Self Care | Payer: Self-pay | Attending: Emergency Medicine | Admitting: Emergency Medicine

## 2021-11-18 ENCOUNTER — Other Ambulatory Visit: Payer: Self-pay

## 2021-11-18 ENCOUNTER — Encounter: Payer: Self-pay | Admitting: Intensive Care

## 2021-11-18 DIAGNOSIS — J45909 Unspecified asthma, uncomplicated: Secondary | ICD-10-CM | POA: Insufficient documentation

## 2021-11-18 DIAGNOSIS — Z7952 Long term (current) use of systemic steroids: Secondary | ICD-10-CM | POA: Insufficient documentation

## 2021-11-18 DIAGNOSIS — F17210 Nicotine dependence, cigarettes, uncomplicated: Secondary | ICD-10-CM | POA: Insufficient documentation

## 2021-11-18 DIAGNOSIS — J449 Chronic obstructive pulmonary disease, unspecified: Secondary | ICD-10-CM | POA: Insufficient documentation

## 2021-11-18 DIAGNOSIS — I776 Arteritis, unspecified: Secondary | ICD-10-CM | POA: Insufficient documentation

## 2021-11-18 HISTORY — DX: Unspecified asthma, uncomplicated: J45.909

## 2021-11-18 HISTORY — DX: Chronic obstructive pulmonary disease, unspecified: J44.9

## 2021-11-18 LAB — CBC WITH DIFFERENTIAL/PLATELET
Abs Immature Granulocytes: 0.02 10*3/uL (ref 0.00–0.07)
Basophils Absolute: 0 10*3/uL (ref 0.0–0.1)
Basophils Relative: 0 %
Eosinophils Absolute: 0.1 10*3/uL (ref 0.0–0.5)
Eosinophils Relative: 1 %
HCT: 43.4 % (ref 36.0–46.0)
Hemoglobin: 14.4 g/dL (ref 12.0–15.0)
Immature Granulocytes: 0 %
Lymphocytes Relative: 28 %
Lymphs Abs: 2.2 10*3/uL (ref 0.7–4.0)
MCH: 28 pg (ref 26.0–34.0)
MCHC: 33.2 g/dL (ref 30.0–36.0)
MCV: 84.4 fL (ref 80.0–100.0)
Monocytes Absolute: 0.7 10*3/uL (ref 0.1–1.0)
Monocytes Relative: 9 %
Neutro Abs: 4.9 10*3/uL (ref 1.7–7.7)
Neutrophils Relative %: 62 %
Platelets: 258 10*3/uL (ref 150–400)
RBC: 5.14 MIL/uL — ABNORMAL HIGH (ref 3.87–5.11)
RDW: 12.7 % (ref 11.5–15.5)
WBC: 7.9 10*3/uL (ref 4.0–10.5)
nRBC: 0 % (ref 0.0–0.2)

## 2021-11-18 LAB — COMPREHENSIVE METABOLIC PANEL
ALT: 47 U/L — ABNORMAL HIGH (ref 0–44)
AST: 40 U/L (ref 15–41)
Albumin: 4 g/dL (ref 3.5–5.0)
Alkaline Phosphatase: 83 U/L (ref 38–126)
Anion gap: 8 (ref 5–15)
BUN: 25 mg/dL — ABNORMAL HIGH (ref 6–20)
CO2: 26 mmol/L (ref 22–32)
Calcium: 9.5 mg/dL (ref 8.9–10.3)
Chloride: 102 mmol/L (ref 98–111)
Creatinine, Ser: 0.71 mg/dL (ref 0.44–1.00)
GFR, Estimated: >60 mL/min (ref >60)
Glucose, Bld: 96 mg/dL (ref 70–99)
Potassium: 4.2 mmol/L (ref 3.5–5.1)
Sodium: 136 mmol/L (ref 135–145)
Total Bilirubin: 0.7 mg/dL (ref 0.3–1.2)
Total Protein: 7.4 g/dL (ref 6.5–8.1)

## 2021-11-18 LAB — SEDIMENTATION RATE: Sed Rate: 2 mm/hr (ref 0–30)

## 2021-11-18 MED ORDER — HYDROXYZINE HCL 25 MG PO TABS: 25.0000 mg | ORAL_TABLET | Freq: Three times a day (TID) | ORAL | 0 refills | Status: DC | PRN

## 2021-11-18 MED ORDER — HYDROXYZINE HCL 25 MG PO TABS
25.0000 mg | ORAL_TABLET | Freq: Once | ORAL | Status: AC
  Administered 2021-11-18: 25 mg via ORAL
  Filled 2021-11-18: qty 1

## 2021-11-18 MED ORDER — PREDNISONE 10 MG PO TABS
ORAL_TABLET | ORAL | 0 refills | Status: DC
  Filled 2021-11-20: qty 18, 9d supply, fill #0

## 2021-11-18 NOTE — ED Triage Notes (Signed)
Patient has large rash on bilateral legs and private area. Reports itchy and burning.

## 2021-11-18 NOTE — ED Provider Notes (Signed)
[place] EMERGENCY DEPARTMENT Provider Note   CSN: [id number] Arrival date & time: 11/18/21  1527     History  Chief Complaint  Patient presents with   Rash    Stacy Gilmore is a 61 y.o. female presents to the ER today with complaint of a rash.  She reports this started this morning.  She reports the rash is very itchy.  The rash started on her thighs but has now spread to her groin.  She denies changes in soaps, lotions or detergents.  She has not been doing any yard work or had any exposure to poison ivy that she is aware of.  No one in her home has a similar rash.  She has not tried anything OTC for this.    Home Medications Prior to Admission medications   Medication Sig Start Date End Date Taking? Authorizing Provider  hydrOXYzine (ATARAX) 25 MG tablet Take 1 tablet (25 mg total) by mouth 3 (three) times daily as needed. 11/18/21  Yes Lakendrick Paradis, Coralie Keens, NP  predniSONE (DELTASONE) 10 MG tablet Take 3 tabs on days 1-3, 2 tabs on days 4-6, 1 tab on days 7-9 11/18/21  Yes Sebrina Kessner, Coralie Keens, NP      Allergies    Bee venom, Penicillins, and Armoracia rusticana ext (horseradish)    Review of Systems   Review of Systems   Past Medical History:  Diagnosis Date   Asthma    COPD (chronic obstructive pulmonary disease) ([place])     No current facility-administered medications for this encounter.   Current Outpatient Medications  Medication Sig Dispense Refill   hydrOXYzine (ATARAX) 25 MG tablet Take 1 tablet (25 mg total) by mouth 3 (three) times daily as needed. 30 tablet 0   predniSONE (DELTASONE) 10 MG tablet Take 3 tabs on days 1-3, 2 tabs on days 4-6, 1 tab on days 7-9 18 tablet 0    Allergies  Allergen Reactions   Bee Venom Swelling   Penicillins Hives and Swelling   Armoracia Rusticana Ext (Horseradish) Rash    Blisters the mouth    History reviewed. No pertinent family history.  Social History   Socioeconomic History   Marital status:  Unknown    Spouse name: Not on file   Number of children: Not on file   Years of education: Not on file   Highest education level: Not on file  Occupational History   Not on file  Tobacco Use   Smoking status: Every Day    Packs/day: 0.30    Types: Cigarettes   Smokeless tobacco: Never  Vaping Use   Vaping Use: Never used  Substance and Sexual Activity   Alcohol use: Yes    Alcohol/week: 6.0 standard drinks of alcohol    Types: 6 Cans of beer per week    Comment: 1.5 40oz   Drug use: Never   Sexual activity: Not on file  Other Topics Concern   Not on file  Social History Narrative   Not on file   Social Determinants of Health   Financial Resource Strain: Not on file  Food Insecurity: Not on file  Transportation Needs: Not on file  Physical Activity: Not on file  Stress: Not on file  Social Connections: Not on file  Intimate Partner Violence: Not on file     Constitutional: Denies fever, malaise, fatigue, headache or abrupt weight changes.  Respiratory: Denies difficulty breathing, shortness of breath, cough or sputum production.   Cardiovascular: Denies chest  pain, chest tightness, palpitations or swelling in the hands or feet.  Skin: Patient reports rash of thighs and groin.  Denies lesions or ulcercations.    No other specific complaints in a complete review of systems (except as listed in HPI above).  Physical Exam Updated Vital Signs BP (!) 134/103 (BP Location: Right Arm)   Pulse 81   Temp 98 F (36.7 C) (Oral)   Resp 16   Ht _0  (1.727 m)   Wt 79.4 kg   SpO2 97%   BMI 26.61 kg/m  Physical Exam  BP (!) 134/103 (BP Location: Right Arm)   Pulse 81   Temp 98 F (36.7 C) (Oral)   Resp 16   Ht _1  (1.727 m)   Wt 79.4 kg   SpO2 97%   BMI 26.61 kg/m  Wt Readings from Last 3 Encounters:  11/18/21 79.4 kg  04/23/20 79.4 kg    General: Appears her stated age, well developed, well nourished in NAD. Skin: Grouped, convalescent, raised,  nonblanchable rash noted of bilateral thighs and groin. Cardiovascular: Normal rate and rhythm. S1,S2 noted.  No murmur, rubs or gallops noted.  Pulmonary/Chest: Normal effort and positive vesicular breath sounds. No respiratory distress. No wheezes, rales or ronchi noted.  Neurological: Alert and oriented.      ED Results / Procedures / Treatments   Labs Labs Reviewed  CBC WITH DIFFERENTIAL/PLATELET - Abnormal; Notable for the following components:      Result Value   RBC 5.14 (*)    All other components within normal limits  COMPREHENSIVE METABOLIC PANEL - Abnormal; Notable for the following components:   BUN 25 (*)    ALT 47 (*)    All other components within normal limits  SEDIMENTATION RATE      Medications Ordered in ED Medications  hydrOXYzine (ATARAX) tablet 25 mg (25 mg Oral Given 11/18/21 1832)    ED Course/ Medical Decision Making/ A&P   Rash:  DDx include vasculitis, contact dermatitis, allergic dermatitis, urticaria CBC does not show any evidence of leukocytosis, anemia or platelet abnormality CMET does not show any evidence of liver, kidney dysfunction or electrolyte abnormality ESR normal Hydroxyzine 25 mg p.o. x1 Discussed the findings with patient, exam consistent with vasculitis of unknown origin Per up-to-date, treatment with glucocorticoids is recommended Rx for Pred taper x9 days Ask for Hydroxyzine 25 mg every 8 hours as needed for itching-sedation caution given We will have her follow-up with dermatology as an outpatient  Final Clinical Impression(s) / ED Diagnoses Final diagnoses:  Vasculitis ([place])    Rx / DC Orders ED Discharge Orders          Ordered    predniSONE (DELTASONE) 10 MG tablet        11/18/21 1917    hydrOXYzine (ATARAX) 25 MG tablet  3 times daily PRN        11/18/21 1917              Jearld Fenton, NP 11/18/21 1918    Blake Divine, MD 11/18/21 2003

## 2021-11-18 NOTE — ED Provider Triage Note (Signed)
Emergency Medicine Provider Triage Evaluation Note  Stacy Gilmore, a 61 y.o. female  was evaluated in triage.  Pt complains of red, itchy rash to the bilateral inner thighs. She believes she may have been exposed to poison ivy/sumac. She was doing some weeding prior to onset. She initially noted some small whelps. Now she notes significant redness, tenderness, warmth, and pruritis.   Review of Systems  Positive: rash Negative: FCS  Physical Exam  BP (!) 134/103 (BP Location: Right Arm)   Pulse 81   Temp 98 F (36.7 C) (Oral)   Resp 16   Ht 5\' 8"  (1.727 m)   Wt 79.4 kg   SpO2 97%   BMI 26.61 kg/m  Gen:   Awake, no distress  NAD Resp:  Normal effort CTA MSK:   Moves extremities without difficulty  Other:  Red, warm, well-demarcated patches to irritated skin to the bilateral inner upper thighs.   Medical Decision Making  Medically screening exam initiated at 5:28 PM.  Appropriate orders placed.  Stacy Gilmore was informed that the remainder of the evaluation will be completed by another provider, this initial triage assessment does not replace that evaluation, and the importance of remaining in the ED until their evaluation is complete.  Patient to the ED for evaluation of itchy, red, raised skin.   Melvenia Needles, PA-C 11/18/21 1731

## 2021-11-18 NOTE — Discharge Instructions (Signed)
You were seen today for rash.  This is consistent with vasculitis.  We are treating you with steroids and antihistamines.  It is important that you see dermatology and follow-up as soon as possible.  Return to the ER symptoms persist or worsen.

## 2021-11-20 ENCOUNTER — Other Ambulatory Visit: Payer: Self-pay

## 2021-11-20 MED ORDER — HYDROXYZINE HCL 25 MG PO TABS
25.0000 mg | ORAL_TABLET | Freq: Three times a day (TID) | ORAL | 0 refills | Status: DC | PRN
  Filled 2021-11-20: qty 30, 10d supply, fill #0

## 2021-11-20 NOTE — Congregational Nurse Program (Signed)
  Dept: [phone]   Congregational Nurse Program Note  Date of Encounter: 11/20/2021 Client to [facility] day center with request for assistance obtaining medications prescribed at an [facility] ER visit on 11/5. Patient was prescribed a prednisone taper and atarax for vasculitis. The prescriptions were sent to [facility], however client has no insurance and cannot to pay for the prescriptions. RN assisted with telephone call to [place] pharmacy to have the prescriptions transfered there. RN assisted client with completing the application for the [employer]. She will pick up the prescriptions later today and bring her completed forms. RN to discuss setting client up at the [facility] for PCP follow up at next visit.Marland Kitchen  Past Medical History: Past Medical History:  Diagnosis Date   Asthma    COPD (chronic obstructive pulmonary disease) ([place])     Encounter Details:  CNP Questionnaire - 11/20/21 1030       Questionnaire   Ask client: Do you give verbal consent for me to treat you today? Yes    Student Assistance N/A    Location Patient [place]    Visit Setting with Client Organization    Patient Status Unknown   client currenlty lives in a rented house   Insurance Uninsured ([place] Card/Care Connects/Self-Pay/Medicaid Family Planning)    Insurance/Financial Assistance Referral N/A    Medication Have Medication Insecurities;Referred to Medication Assistance   assisted client with application for [facility] Provider No    Screening Referrals Made N/A    Medical Referrals Made N/A   [facility] community pharmacy   Medical Appointment Made N/A    Recently w/o PCP, now 1st time PCP visit completed due to CNs referral or appointment made N/A    Food N/A   recently started receiving food stamps   Transportation N/A   currenlty has  friend who can provide transportation or uses the [employer] bus system   Housing/Utilities N/A     Interpersonal Safety N/A    Interventions Advocate/Support;Educate;Navigate Healthcare System    Abnormal to Normal Screening Since Last CN Visit N/A    Screenings CN Performed N/A    Sent Client to Lab for: N/A    Did client attend any of the following based off CNs referral or appointments made? Medication Assistance    ED Visit Averted N/A    Life-Saving Intervention Made N/A

## 2021-11-27 NOTE — Congregational Nurse Program (Signed)
  Dept: [phone]   Congregational Nurse Program Note  Date of Encounter: 11/27/2021 Client to [facility] day center for assistance with an [facility] application. Application completed and RN to drop off today. Client has a phone and will contact [facility] to make an apt. No other needs at this time. Past Medical History: Past Medical History:  Diagnosis Date   Asthma    COPD (chronic obstructive pulmonary disease) (HCC)     Encounter Details:  CNP Questionnaire - 11/27/21 1015       Questionnaire   Ask client: Do you give verbal consent for me to treat you today? Yes    Student Assistance N/A    Location Patient Served  [facility]    Visit Setting with Client Organization    Patient Status Unknown   client currenlty lives in a rented house   Insurance Uninsured ([place] Card/Care Connects/Self-Pay/Medicaid Family Planning)    Insurance/Financial Assistance Referral N/A    Medication Have Medication Insecurities;Referred to Medication Assistance   assisted client with application for [facility] community pharmacy   Medical Provider No   [facility] application completed today   Screening Referrals Made N/A    Medical Referrals Made Non-[facility] PCP/Clinic   [facility] application completed and RN to drop off.   Medical Appointment Made N/A   Client has a phone and [facility] will contact with apt.   Recently w/o PCP, now 1st time PCP visit completed due to CNs referral or appointment made N/A    Food N/A   recently started receiving food stamps   Transportation N/A   currenlty has  friend who can provide transportation or uses the [employer] bus system   Housing/Utilities N/A    Interpersonal Safety N/A    Interventions Advocate/Support;Educate;Navigate Healthcare System    Abnormal to Normal Screening Since Last CN Visit N/A    Screenings CN Performed N/A    Sent Client to Lab for: N/A    Did client attend any of the following based off CNs referral or appointments made? N/A     ED Visit Averted N/A    Life-Saving Intervention Made N/A

## 2021-11-29 NOTE — Congregational Nurse Program (Signed)
  Dept: [phone]   Congregational Nurse Program Note  Date of Encounter: 11/29/2021 Client to [facility] Day center for follow up of [facility] application completed on 11/14. Call placed to [facility], apt given for 11/28 at 1:30 pm. Apt card given to client . No other needs at this time. Past Medical History: Past Medical History:  Diagnosis Date   Asthma    COPD (chronic obstructive pulmonary disease) (HCC)     Encounter Details:  CNP Questionnaire - 11/29/21 1151       Questionnaire   Ask client: Do you give verbal consent for me to treat you today? Yes    Student Assistance N/A    Location Patient Served  [facility]    Visit Setting with Client Organization    Patient Status Unknown   client currenlty lives in a rented house   Insurance Uninsured ([place] Card/Care Connects/Self-Pay/Medicaid Family Planning)    Insurance/Financial Assistance Referral N/A    Medication --   assisted client with application for [facility] community pharmacy. Cleint now will get mediactions there   Medical Provider No   [facility] application completed today   Screening Referrals Made N/A    Medical Referrals Made Non-[facility] PCP/Clinic   [facility] application completed and RN to drop off.   Medical Appointment Made Non-[facility] PCP/clinic   [facility] apt 11/28 at 1:30,apt  card given to client   Recently w/o PCP, now 1st time PCP visit completed due to CNs referral or appointment made Yes   apt at [facility] 11/28 at 1:30   Food N/A   recently started receiving food stamps   Transportation N/A   currenlty has  friend who can provide transportation or uses the [employer] bus system   Housing/Utilities N/A    Interpersonal Safety N/A    Interventions Advocate/Support;Educate;Navigate Healthcare System    Abnormal to Normal Screening Since Last CN Visit N/A    Screenings CN Performed N/A    Sent Client to Lab for: N/A    Did client attend any of the following based off CNs referral or appointments  made? N/A    ED Visit Averted N/A    Life-Saving Intervention Made N/A

## 2021-12-04 ENCOUNTER — Other Ambulatory Visit: Payer: Self-pay

## 2021-12-11 ENCOUNTER — Other Ambulatory Visit: Payer: Self-pay

## 2021-12-11 ENCOUNTER — Ambulatory Visit: Payer: Self-pay | Admitting: Gerontology

## 2021-12-11 ENCOUNTER — Encounter: Payer: Self-pay | Admitting: Gerontology

## 2021-12-11 VITALS — BP 124/75 | HR 65 | Temp 97.5°F | Resp 16 | Ht 66.5 in | Wt 156.9 lb

## 2021-12-11 DIAGNOSIS — Z8709 Personal history of other diseases of the respiratory system: Secondary | ICD-10-CM | POA: Insufficient documentation

## 2021-12-11 DIAGNOSIS — Z8379 Family history of other diseases of the digestive system: Secondary | ICD-10-CM | POA: Insufficient documentation

## 2021-12-11 DIAGNOSIS — J449 Chronic obstructive pulmonary disease, unspecified: Secondary | ICD-10-CM | POA: Insufficient documentation

## 2021-12-11 DIAGNOSIS — F17200 Nicotine dependence, unspecified, uncomplicated: Secondary | ICD-10-CM | POA: Insufficient documentation

## 2021-12-11 DIAGNOSIS — Z7689 Persons encountering health services in other specified circumstances: Secondary | ICD-10-CM | POA: Insufficient documentation

## 2021-12-11 DIAGNOSIS — Z8719 Personal history of other diseases of the digestive system: Secondary | ICD-10-CM | POA: Insufficient documentation

## 2021-12-11 DIAGNOSIS — F17211 Nicotine dependence, cigarettes, in remission: Secondary | ICD-10-CM | POA: Insufficient documentation

## 2021-12-11 MED ORDER — ALBUTEROL SULFATE HFA 108 (90 BASE) MCG/ACT IN AERS
1.0000 | INHALATION_SPRAY | Freq: Four times a day (QID) | RESPIRATORY_TRACT | 5 refills | Status: DC | PRN
  Filled 2021-12-11: qty 6.7, 25d supply, fill #0
  Filled 2022-01-16: qty 6.7, 25d supply, fill #1

## 2021-12-11 MED ORDER — FLUTICASONE FUROATE-VILANTEROL 100-25 MCG/ACT IN AEPB
1.0000 | INHALATION_SPRAY | Freq: Every day | RESPIRATORY_TRACT | 3 refills | Status: DC
  Filled 2021-12-11: qty 60, 60d supply, fill #0

## 2021-12-11 MED ORDER — OMEPRAZOLE 20 MG PO CPDR
20.0000 mg | DELAYED_RELEASE_CAPSULE | Freq: Every day | ORAL | 3 refills | Status: DC
  Filled 2021-12-11: qty 30, 30d supply, fill #0
  Filled 2022-01-16: qty 30, 30d supply, fill #1

## 2021-12-11 NOTE — Patient Instructions (Signed)
Smoking Tobacco Information, Adult Smoking tobacco can be harmful to your health. Tobacco contains a toxic colorless chemical called nicotine. Nicotine causes changes in your brain that make you want more and more. This is called addiction. This can make it hard to stop smoking once you start. Tobacco also has other toxic chemicals that can hurt your body and raise your risk of many cancers. Menthol or "lite" tobacco or cigarette brands are not safer than regular brands. How can smoking tobacco affect me? Smoking tobacco puts you at risk for: Cancer. Smoking is most commonly associated with lung cancer, but can also lead to cancer in other parts of the body. Chronic obstructive pulmonary disease (COPD). This is a long-term lung condition that makes it hard to breathe. It also gets worse over time. High blood pressure (hypertension), heart disease, stroke, heart attack, and lung infections, such as pneumonia. Cataracts. This is when the lenses in the eyes become clouded. Digestive problems. This may include peptic ulcers, heartburn, and gastroesophageal reflux disease (GERD). Oral health problems, such as gum disease, mouth sores, and tooth loss. Loss of taste and smell. Smoking also affects how you look and smell. Smoking may cause: Wrinkles. Yellow or stained teeth, fingers, and fingernails. Bad breath. Bad-smelling clothes and hair. Smoking tobacco can also affect your social life, because: It may be challenging to find places to smoke when away from home. Many workplaces, restaurants, hotels, and public places are tobacco-free. Smoking is expensive. This is due to the cost of tobacco and the long-term costs of treating health problems from smoking. Secondhand smoke may affect those around you. Secondhand smoke can cause lung cancer, breathing problems, and heart disease. Children of smokers have a higher risk for: Sudden infant death syndrome (SIDS). Ear infections. Lung infections. What  actions can I take to prevent health problems? Quit smoking  Do not start smoking. Quit if you already smoke. Do not replace cigarette smoking with vaping devices, such as e-cigarettes. Make a plan to quit smoking and commit to it. Look for programs to help you, and ask your health care provider for recommendations and ideas. Set a date and write down all the reasons you want to quit. Let your friends and family know you are quitting so they can help and support you. Consider finding friends who also want to quit. It can be easier to quit with someone else, so that you can support each other. Talk with your health care provider about using nicotine replacement medicines to help you quit. These include gum, lozenges, patches, sprays, or pills. If you try to quit but return to smoking, stay positive. It is common to slip up when you first quit, so take it one day at a time. Be prepared for cravings. When you feel the urge to smoke, chew gum or suck on hard candy. Lifestyle Stay busy. Take care of your body. Get plenty of exercise, eat a healthy diet, and drink plenty of water. Find ways to manage your stress, such as meditation, yoga, exercise, or time spent with friends and family. Ask your health care provider about having regular tests (screenings) to check for cancer. This may include blood tests, imaging tests, and other tests. Where to find support To get support to quit smoking, consider: Asking your health care provider for more information and resources. Joining a support group for people who want to quit smoking in your local community. There are many effective programs that may help you to quit. Calling the smokefree.gov counselor   helpline at 1-800-QUIT-NOW (1-800-784-8669). Where to find more information You may find more information about quitting smoking from: Centers for Disease Control and Prevention: cdc.gov/tobacco Smokefree.gov: smokefree.gov American Lung Association:  freedomfromsmoking.org Contact a health care provider if: You have problems breathing. Your lips, nose, or fingers turn blue. You have chest pain. You are coughing up blood. You feel like you will faint. You have other health changes that cause you to worry. Summary Smoking tobacco can negatively affect your health, the health of those around you, your finances, and your social life. Do not start smoking. Quit if you already smoke. If you need help quitting, ask your health care provider. Consider joining a support group for people in your local community who want to quit smoking. There are many effective programs that may help you to quit. This information is not intended to replace advice given to you by your health care provider. Make sure you discuss any questions you have with your health care provider. Document Revised: 12/26/2020 Document Reviewed: 12/26/2020 Elsevier Patient Education  2023 Elsevier Inc.  
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
## 2021-12-12 ENCOUNTER — Other Ambulatory Visit: Payer: Self-pay

## 2021-12-12 ENCOUNTER — Other Ambulatory Visit: Payer: Self-pay | Admitting: Gerontology

## 2021-12-12 DIAGNOSIS — N390 Urinary tract infection, site not specified: Secondary | ICD-10-CM

## 2021-12-12 LAB — HEMOGLOBIN A1C
Est. average glucose Bld gHb Est-mCnc: 114 mg/dL
Hgb A1c MFr Bld: 5.6 % (ref 4.8–5.6)

## 2021-12-12 LAB — COMPREHENSIVE METABOLIC PANEL
ALT: 50 IU/L — ABNORMAL HIGH (ref 0–32)
AST: 38 IU/L (ref 0–40)
Albumin/Globulin Ratio: 1.7 (ref 1.2–2.2)
Albumin: 4.3 g/dL (ref 3.8–4.9)
Alkaline Phosphatase: 95 IU/L (ref 44–121)
BUN/Creatinine Ratio: 21 (ref 12–28)
BUN: 13 mg/dL (ref 8–27)
Bilirubin Total: 0.3 mg/dL (ref 0.0–1.2)
CO2: 23 mmol/L (ref 20–29)
Calcium: 9.3 mg/dL (ref 8.7–10.3)
Chloride: 103 mmol/L (ref 96–106)
Creatinine, Ser: 0.63 mg/dL (ref 0.57–1.00)
Globulin, Total: 2.5 g/dL (ref 1.5–4.5)
Glucose: 96 mg/dL (ref 70–99)
Potassium: 4.4 mmol/L (ref 3.5–5.2)
Sodium: 142 mmol/L (ref 134–144)
Total Protein: 6.8 g/dL (ref 6.0–8.5)
eGFR: 101 mL/min/{1.73_m2} (ref >59)

## 2021-12-12 LAB — URINALYSIS
Bilirubin, UA: NEGATIVE
Glucose, UA: NEGATIVE
Ketones, UA: NEGATIVE
Nitrite, UA: POSITIVE — AB
Protein,UA: NEGATIVE
RBC, UA: NEGATIVE
Specific Gravity, UA: 1.019 (ref 1.005–1.030)
Urobilinogen, Ur: 0.2 mg/dL (ref 0.2–1.0)
pH, UA: 5 (ref 5.0–7.5)

## 2021-12-12 LAB — CBC WITH DIFFERENTIAL/PLATELET
Basophils Absolute: 0 10*3/uL (ref 0.0–0.2)
Basos: 1 %
EOS (ABSOLUTE): 0.1 10*3/uL (ref 0.0–0.4)
Eos: 2 %
Hematocrit: 43.9 % (ref 34.0–46.6)
Hemoglobin: 14.1 g/dL (ref 11.1–15.9)
Immature Grans (Abs): 0 10*3/uL (ref 0.0–0.1)
Immature Granulocytes: 0 %
Lymphocytes Absolute: 2 10*3/uL (ref 0.7–3.1)
Lymphs: 36 %
MCH: 28 pg (ref 26.6–33.0)
MCHC: 32.1 g/dL (ref 31.5–35.7)
MCV: 87 fL (ref 79–97)
Monocytes Absolute: 0.6 10*3/uL (ref 0.1–0.9)
Monocytes: 10 %
Neutrophils Absolute: 2.8 10*3/uL (ref 1.4–7.0)
Neutrophils: 51 %
Platelets: 279 10*3/uL (ref 150–450)
RBC: 5.04 x10E6/uL (ref 3.77–5.28)
RDW: 12.5 % (ref 11.7–15.4)
WBC: 5.5 10*3/uL (ref 3.4–10.8)

## 2021-12-12 LAB — LIPID PANEL
Chol/HDL Ratio: 3.8 ratio (ref 0.0–4.4)
Cholesterol, Total: 161 mg/dL (ref 100–199)
HDL: 42 mg/dL
LDL Chol Calc (NIH): 93 mg/dL (ref 0–99)
Triglycerides: 145 mg/dL (ref 0–149)
VLDL Cholesterol Cal: 26 mg/dL (ref 5–40)

## 2021-12-12 MED ORDER — NITROFURANTOIN MONOHYD MACRO 100 MG PO CAPS
100.0000 mg | ORAL_CAPSULE | Freq: Two times a day (BID) | ORAL | 0 refills | Status: DC
  Filled 2021-12-12: qty 10, 5d supply, fill #0

## 2021-12-21 ENCOUNTER — Other Ambulatory Visit: Payer: Self-pay | Admitting: Pharmacy Technician

## 2021-12-21 ENCOUNTER — Other Ambulatory Visit: Payer: Self-pay

## 2021-12-21 MED ORDER — FLUTICASONE FUROATE-VILANTEROL 100-25 MCG/ACT IN AEPB
INHALATION_SPRAY | RESPIRATORY_TRACT | 3 refills | Status: DC
  Filled 2022-01-17: qty 180, 90d supply, fill #0

## 2021-12-21 NOTE — Progress Notes (Signed)
Completed PAP application for Breo.  Submitted to [employer].  Will take about 4 to 6 weeks to obtain medication.  Sherilyn Dacosta Patient Advocate Specialist [facility] at [facility]

## 2021-12-24 NOTE — Congregational Nurse Program (Signed)
  Dept: [phone]   Congregational Nurse Program Note  Date of Encounter: 12/24/2021 Client to [facility] day center needing copies of her ID and SS card. Copies made and given to client. She reports she has been referred by the [facility] NP to have a mammogram and colonoscopy. She was given the application for [facility] at her apt. RN offered assistance with this application, client declined at this time. No other needs today. Past Medical History: Past Medical History:  Diagnosis Date   Asthma    COPD (chronic obstructive pulmonary disease) (HCC)    GERD (gastroesophageal reflux disease)    History of diverticulitis     Encounter Details:  CNP Questionnaire - 12/24/21 0923       Questionnaire   Ask client: Do you give verbal consent for me to treat you today? Yes    Student Assistance N/A    Location Patient Served  [facility]    Visit Setting with Client Organization    Patient Status Unknown   client currenlty lives in a rented house   Insurance Uninsured ([place] Card/Care Connects/Self-Pay/Medicaid Family Planning)    Insurance/Financial Assistance Referral N/A    Medication N/A   assisted client with application for [facility] community pharmacy. Cleint now will get mediactions there   Medical Provider Yes   Client is now a patient at [facility]   Screening Referrals Made N/A    Medical Referrals Made N/A    Medical Appointment Made N/A    Recently w/o PCP, now 1st time PCP visit completed due to CNs referral or appointment made Yes   Client did attend her first [facility] at on 11/28, has a fo/u apt on 1/4   Food N/A   recently started receiving food stamps   Transportation N/A   currenlty has  friend who can provide transportation or uses the [employer] bus system   Housing/Utilities N/A    Interpersonal Safety N/A    Interventions Advocate/Support;Educate    Abnormal to Normal Screening Since Last CN Visit N/A    Screenings CN Performed N/A    Sent  Client to Lab for: N/A    Did client attend any of the following based off CNs referral or appointments made? N/A    ED Visit Averted N/A    Life-Saving Intervention Made N/A

## 2021-12-25 ENCOUNTER — Other Ambulatory Visit: Payer: Self-pay

## 2022-01-01 ENCOUNTER — Other Ambulatory Visit: Payer: Self-pay

## 2022-01-16 ENCOUNTER — Other Ambulatory Visit: Payer: Self-pay

## 2022-01-17 ENCOUNTER — Other Ambulatory Visit: Payer: Self-pay

## 2022-01-17 ENCOUNTER — Encounter: Payer: Self-pay | Admitting: Gerontology

## 2022-01-17 ENCOUNTER — Ambulatory Visit: Payer: Self-pay | Admitting: Gerontology

## 2022-01-17 VITALS — BP 133/82 | HR 76 | Temp 97.5°F | Resp 18 | Ht 66.5 in | Wt 157.6 lb

## 2022-01-17 DIAGNOSIS — Z0000 Encounter for general adult medical examination without abnormal findings: Secondary | ICD-10-CM

## 2022-01-17 DIAGNOSIS — M2550 Pain in unspecified joint: Secondary | ICD-10-CM | POA: Insufficient documentation

## 2022-01-17 DIAGNOSIS — N390 Urinary tract infection, site not specified: Secondary | ICD-10-CM

## 2022-01-17 DIAGNOSIS — F17200 Nicotine dependence, unspecified, uncomplicated: Secondary | ICD-10-CM

## 2022-01-17 MED ORDER — NAPROXEN 500 MG PO TABS
500.0000 mg | ORAL_TABLET | Freq: Two times a day (BID) | ORAL | 0 refills | Status: DC
  Filled 2022-01-17: qty 60, 30d supply, fill #0

## 2022-01-17 NOTE — Progress Notes (Signed)
Established Patient Office Visit  Subjective   Patient ID: Stacy Gilmore, female    DOB: [birth year]  Age: 62 y.o. MRN: [id number]  Chief Complaint  Patient presents with   Follow-up    Labs drawn 12/11/21   Joint Pain    Patient c/o joint pain x 3 months, getting worse.    HPI  Stacy Gilmore is a 62 y/o female who has history of Asthma, COPD and acid reflux presents routine follow up visit and lab review. Her labs done on 12/11/21 was within normal limits except ALT was 50 and has positive Nitrite and 2+ Leukocytes. She states that she did not pick up 100 mg Macrobid that was ordered. She denies dysuria, urinary frequency, urgency , flank and pelvic pain. Currently, she states that she feels tired, and c/o pain to her fingers, elbow, shoulder and knee joints. She states that pain is constant, throbbing, burning and tingling sensation, and 8/10 to her hands that started 6 months ago. She states that she has not taking any medication for pain, states that cold weather aggravates symptoms. Overall, she states that she's doing well and offers no further complaint.    Review of Systems  Constitutional: Negative.   Respiratory: Negative.    Cardiovascular: Negative.   Gastrointestinal: Negative.   Genitourinary: Negative.   Musculoskeletal:  Positive for joint pain (fingers, knees, shoulder and elbows). Negative for back pain.  Neurological:  Positive for tingling.  Psychiatric/Behavioral: Negative.        Objective:     BP 133/82 (BP Location: Right Arm, Patient Position: Sitting, Cuff Size: Large)   Pulse 76   Temp (!) 97.5 F (36.4 C) (Oral)   Resp 18   Ht 5' 6.5" (1.689 m)   Wt 157 lb 9.6 oz (71.5 kg)   SpO2 94%   BMI 25.06 kg/m  BP Readings from Last 3 Encounters:  01/17/22 133/82  12/11/21 124/75  11/18/21 (!) 134/103   Wt Readings from Last 3 Encounters:  01/17/22 157 lb 9.6 oz (71.5 kg)  12/11/21 156 lb 14.4 oz (71.2 kg)  11/18/21 175 lb (79.4 kg)       Physical Exam HENT:     Head: Normocephalic and atraumatic.  Cardiovascular:     Rate and Rhythm: Normal rate and regular rhythm.     Pulses: Normal pulses.     Heart sounds: Normal heart sounds.  Pulmonary:     Effort: Pulmonary effort is normal.     Breath sounds: Normal breath sounds.  Musculoskeletal:        General: No swelling, tenderness or deformity.  Skin:    General: Skin is warm.  Neurological:     General: No focal deficit present.     Mental Status: She is alert and oriented to person, place, and time. Mental status is at baseline.  Psychiatric:        Mood and Affect: Mood normal.        Behavior: Behavior normal.        Thought Content: Thought content normal.        Judgment: Judgment normal.      No results found for any visits on 01/17/22.  Last CBC Lab Results  Component Value Date   WBC 5.5 12/11/2021   HGB 14.1 12/11/2021   HCT 43.9 12/11/2021   MCV 87 12/11/2021   MCH 28.0 12/11/2021   RDW 12.5 12/11/2021   PLT 279 76/16/0737   Last metabolic panel Lab Results  Component Value Date   GLUCOSE 96 12/11/2021   NA 142 12/11/2021   K 4.4 12/11/2021   CL 103 12/11/2021   CO2 23 12/11/2021   BUN 13 12/11/2021   CREATININE 0.63 12/11/2021   EGFR 101 12/11/2021   CALCIUM 9.3 12/11/2021   PROT 6.8 12/11/2021   ALBUMIN 4.3 12/11/2021   LABGLOB 2.5 12/11/2021   AGRATIO 1.7 12/11/2021   BILITOT 0.3 12/11/2021   ALKPHOS 95 12/11/2021   AST 38 12/11/2021   ALT 50 (H) 12/11/2021   ANIONGAP 8 11/18/2021   Last lipids Lab Results  Component Value Date   CHOL 161 12/11/2021   HDL 42 12/11/2021   LDLCALC 93 12/11/2021   TRIG 145 12/11/2021   CHOLHDL 3.8 12/11/2021   Last hemoglobin A1c Lab Results  Component Value Date   HGBA1C 5.6 12/11/2021   Last thyroid functions No results found for: "TSH", "T3TOTAL", "T4TOTAL", "THYROIDAB" Last vitamin D No results found for: "25OHVITD2", "25OHVITD3", "VD25OH"    The 10-year ASCVD risk  score (Arnett DK, et al., 2019) is: 7.5%    Assessment & Plan:   1. Generalized joint pain - Ddx Arthritis,was started on Naproxen 500 mg bid, educated on medication side effects and advised to notify clinic. If no relief, will refer to [facility] Rheumatologist Dr Jefm Bryant. - naproxen (NAPROSYN) 500 MG tablet; Take 1 tablet (500 mg total) by mouth 2 (two) times daily with a meal.  Dispense: 60 tablet; Refill: 0  2. Health care maintenance - She received Flu vaccine and will obtain Vitamin D to rule out fatigue. - Flu Vaccine QUAD 6+ mos PF IM (Fluarix Quad PF) - Vitamin D (25 hydroxy); Future - Vitamin D (25 hydroxy)  3. Smoking - She will follow up for Low dose chest CT screening. - CT CHEST LUNG CA SCREEN LOW DOSE W/O CM; Future  4. Urinary tract infection without hematuria, site unspecified - She denies urinary tract infection symptoms, will recheck urine. - UA/M w/rflx Culture, Routine   Return in about 4 weeks (around 02/14/2022), or if symptoms worsen or fail to improve.    Shams Fill Jerold Coombe, NP

## 2022-01-17 NOTE — Congregational Nurse Program (Signed)
  Dept: [phone]   Congregational Nurse Program Note  Date of Encounter: 01/17/2022 Client to [facility] day center to pick up a bus pass for the [employer] bus. The bus is now charging and client has an apt [facility] today. RN requested that client provides any updates on medications after her apt.  Client voiced understanding and agreement. Past Medical History: Past Medical History:  Diagnosis Date   Asthma    COPD (chronic obstructive pulmonary disease) ([place])    GERD (gastroesophageal reflux disease)    History of diverticulitis     Encounter Details:  CNP Questionnaire - 01/17/22 0835       Questionnaire   Ask client: Do you give verbal consent for me to treat you today? Yes    Student Assistance N/A    Location Patient [place]    Visit Setting with Client Organization    Patient Status Unknown   client currenlty lives in a rented house   Insurance Uninsured ([place] Card/Care Connects/Self-Pay/Medicaid Family Planning)    Insurance/Financial Assistance Referral N/A    Medication N/A   medications provided through [facility] Provider Yes   Client is now a patient at [facility]   Screening Referrals [place] Appointment Made N/A    Recently w/o PCP, now 1st time PCP visit completed due to CNs referral or appointment made N/A   patient at [facility]   Food N/A   recently started receiving food stamps   Transportation Provided transportation assistance   Client given bus pass for [employer] bus for travel to [facility] apt today. [employer] bus now costs 1.00 each way   Housing/Utilities N/A    Interpersonal Safety N/A    Interventions Advocate/Support;Educate;Reviewed Medications    Abnormal to Normal Screening Since Last CN Visit N/A    Screenings CN Performed N/A    Sent Client to Lab for: N/A    Did client attend any of the following based off CNs referral or appointments made? N/A     ED Visit Averted N/A    Life-Saving Intervention Made N/A

## 2022-01-18 LAB — MICROSCOPIC EXAMINATION
Casts: NONE SEEN /lpf
RBC, Urine: NONE SEEN /hpf (ref 0–2)

## 2022-01-18 LAB — UA/M W/RFLX CULTURE, ROUTINE
Bilirubin, UA: NEGATIVE
Glucose, UA: NEGATIVE
Ketones, UA: NEGATIVE
Nitrite, UA: POSITIVE — AB
Protein,UA: NEGATIVE
RBC, UA: NEGATIVE
Specific Gravity, UA: 1.018 (ref 1.005–1.030)
Urobilinogen, Ur: 0.2 mg/dL (ref 0.2–1.0)
pH, UA: 5.5 (ref 5.0–7.5)

## 2022-01-18 LAB — VITAMIN D 25 HYDROXY (VIT D DEFICIENCY, FRACTURES): Vit D, 25-Hydroxy: 24.4 ng/mL — ABNORMAL LOW (ref 30.0–100.0)

## 2022-01-18 LAB — URINE CULTURE, REFLEX

## 2022-02-06 ENCOUNTER — Inpatient Hospital Stay
Admission: EM | Admit: 2022-02-06 | Discharge: 2022-02-10 | DRG: 392 | Disposition: A | Discharge status: Home / Self Care | Payer: Medicaid Other | Attending: Osteopathic Medicine | Admitting: Osteopathic Medicine

## 2022-02-06 ENCOUNTER — Other Ambulatory Visit: Payer: Self-pay

## 2022-02-06 ENCOUNTER — Encounter: Payer: Self-pay | Admitting: *Deleted

## 2022-02-06 ENCOUNTER — Emergency Department: Payer: Medicaid Other

## 2022-02-06 DIAGNOSIS — Z820 Family history of epilepsy and other diseases of the nervous system: Secondary | ICD-10-CM

## 2022-02-06 DIAGNOSIS — K5792 Diverticulitis of intestine, part unspecified, without perforation or abscess without bleeding: Secondary | ICD-10-CM | POA: Diagnosis not present

## 2022-02-06 DIAGNOSIS — Z818 Family history of other mental and behavioral disorders: Secondary | ICD-10-CM

## 2022-02-06 DIAGNOSIS — K5900 Constipation, unspecified: Secondary | ICD-10-CM | POA: Diagnosis present

## 2022-02-06 DIAGNOSIS — I700 Atherosclerosis of aorta: Secondary | ICD-10-CM | POA: Insufficient documentation

## 2022-02-06 DIAGNOSIS — F17210 Nicotine dependence, cigarettes, uncomplicated: Secondary | ICD-10-CM | POA: Diagnosis present

## 2022-02-06 DIAGNOSIS — Z8719 Personal history of other diseases of the digestive system: Secondary | ICD-10-CM

## 2022-02-06 DIAGNOSIS — B9620 Unspecified Escherichia coli [E. coli] as the cause of diseases classified elsewhere: Secondary | ICD-10-CM | POA: Diagnosis present

## 2022-02-06 DIAGNOSIS — Z801 Family history of malignant neoplasm of trachea, bronchus and lung: Secondary | ICD-10-CM

## 2022-02-06 DIAGNOSIS — Z803 Family history of malignant neoplasm of breast: Secondary | ICD-10-CM

## 2022-02-06 DIAGNOSIS — Z91030 Bee allergy status: Secondary | ICD-10-CM

## 2022-02-06 DIAGNOSIS — J4489 Other specified chronic obstructive pulmonary disease: Secondary | ICD-10-CM | POA: Diagnosis present

## 2022-02-06 DIAGNOSIS — Z888 Allergy status to other drugs, medicaments and biological substances status: Secondary | ICD-10-CM

## 2022-02-06 DIAGNOSIS — Z880 Allergy status to penicillin: Secondary | ICD-10-CM

## 2022-02-06 DIAGNOSIS — Z8049 Family history of malignant neoplasm of other genital organs: Secondary | ICD-10-CM

## 2022-02-06 DIAGNOSIS — F1490 Cocaine use, unspecified, uncomplicated: Secondary | ICD-10-CM | POA: Insufficient documentation

## 2022-02-06 DIAGNOSIS — R54 Age-related physical debility: Secondary | ICD-10-CM | POA: Diagnosis present

## 2022-02-06 DIAGNOSIS — Z79899 Other long term (current) drug therapy: Secondary | ICD-10-CM

## 2022-02-06 DIAGNOSIS — Z8349 Family history of other endocrine, nutritional and metabolic diseases: Secondary | ICD-10-CM

## 2022-02-06 DIAGNOSIS — Z833 Family history of diabetes mellitus: Secondary | ICD-10-CM

## 2022-02-06 DIAGNOSIS — Z9049 Acquired absence of other specified parts of digestive tract: Secondary | ICD-10-CM

## 2022-02-06 DIAGNOSIS — Z8709 Personal history of other diseases of the respiratory system: Secondary | ICD-10-CM

## 2022-02-06 DIAGNOSIS — Z7951 Long term (current) use of inhaled steroids: Secondary | ICD-10-CM

## 2022-02-06 DIAGNOSIS — K219 Gastro-esophageal reflux disease without esophagitis: Secondary | ICD-10-CM | POA: Diagnosis present

## 2022-02-06 DIAGNOSIS — K5732 Diverticulitis of large intestine without perforation or abscess without bleeding: Principal | ICD-10-CM | POA: Diagnosis present

## 2022-02-06 DIAGNOSIS — J449 Chronic obstructive pulmonary disease, unspecified: Secondary | ICD-10-CM

## 2022-02-06 DIAGNOSIS — I2510 Atherosclerotic heart disease of native coronary artery without angina pectoris: Secondary | ICD-10-CM | POA: Insufficient documentation

## 2022-02-06 DIAGNOSIS — N390 Urinary tract infection, site not specified: Secondary | ICD-10-CM | POA: Diagnosis present

## 2022-02-06 DIAGNOSIS — R197 Diarrhea, unspecified: Secondary | ICD-10-CM | POA: Insufficient documentation

## 2022-02-06 LAB — CBC
HCT: 41.6 % (ref 36.0–46.0)
Hemoglobin: 13.5 g/dL (ref 12.0–15.0)
MCH: 27.6 pg (ref 26.0–34.0)
MCHC: 32.5 g/dL (ref 30.0–36.0)
MCV: 85.1 fL (ref 80.0–100.0)
Platelets: 258 10*3/uL (ref 150–400)
RBC: 4.89 MIL/uL (ref 3.87–5.11)
RDW: 12.8 % (ref 11.5–15.5)
WBC: 13.5 10*3/uL — ABNORMAL HIGH (ref 4.0–10.5)
nRBC: 0 % (ref 0.0–0.2)

## 2022-02-06 LAB — COMPREHENSIVE METABOLIC PANEL
ALT: 47 U/L — ABNORMAL HIGH (ref 0–44)
AST: 45 U/L — ABNORMAL HIGH (ref 15–41)
Albumin: 3.6 g/dL (ref 3.5–5.0)
Alkaline Phosphatase: 67 U/L (ref 38–126)
Anion gap: 11 (ref 5–15)
BUN: 12 mg/dL (ref 6–20)
CO2: 26 mmol/L (ref 22–32)
Calcium: 9.3 mg/dL (ref 8.9–10.3)
Chloride: 98 mmol/L (ref 98–111)
Creatinine, Ser: 0.63 mg/dL (ref 0.44–1.00)
GFR, Estimated: >60 mL/min (ref >60)
Glucose, Bld: 112 mg/dL — ABNORMAL HIGH (ref 70–99)
Potassium: 3.9 mmol/L (ref 3.5–5.1)
Sodium: 135 mmol/L (ref 135–145)
Total Bilirubin: 0.9 mg/dL (ref 0.3–1.2)
Total Protein: 7.4 g/dL (ref 6.5–8.1)

## 2022-02-06 LAB — URINALYSIS, ROUTINE W REFLEX MICROSCOPIC
Bilirubin Urine: NEGATIVE
Glucose, UA: NEGATIVE mg/dL
Hgb urine dipstick: NEGATIVE
Ketones, ur: NEGATIVE mg/dL
Nitrite: POSITIVE — AB
Protein, ur: 30 mg/dL — AB
Specific Gravity, Urine: 1.018 (ref 1.005–1.030)
pH: 5 (ref 5.0–8.0)

## 2022-02-06 LAB — LIPASE, BLOOD: Lipase: 47 U/L (ref 11–51)

## 2022-02-06 MED ORDER — ALBUTEROL SULFATE (2.5 MG/3ML) 0.083% IN NEBU: 2.5000 mg | INHALATION_SOLUTION | Freq: Four times a day (QID) | RESPIRATORY_TRACT | Status: DC | PRN

## 2022-02-06 MED ORDER — CIPROFLOXACIN IN D5W 400 MG/200ML IV SOLN
400.0000 mg | Freq: Two times a day (BID) | INTRAVENOUS | Status: DC
  Administered 2022-02-07 – 2022-02-10 (×7): 400 mg via INTRAVENOUS
  Filled 2022-02-06 (×8): qty 200

## 2022-02-06 MED ORDER — ACETAMINOPHEN 325 MG PO TABS
650.0000 mg | ORAL_TABLET | Freq: Four times a day (QID) | ORAL | Status: DC | PRN
  Administered 2022-02-08 – 2022-02-10 (×7): 650 mg via ORAL
  Filled 2022-02-06 (×7): qty 2

## 2022-02-06 MED ORDER — ACETAMINOPHEN 325 MG PO TABS
650.0000 mg | ORAL_TABLET | Freq: Once | ORAL | Status: AC
  Administered 2022-02-06: 650 mg via ORAL
  Filled 2022-02-06: qty 2

## 2022-02-06 MED ORDER — PANTOPRAZOLE SODIUM 40 MG PO TBEC
40.0000 mg | DELAYED_RELEASE_TABLET | Freq: Every day | ORAL | Status: DC
  Administered 2022-02-06 – 2022-02-10 (×5): 40 mg via ORAL
  Filled 2022-02-06 (×5): qty 1

## 2022-02-06 MED ORDER — MORPHINE SULFATE (PF) 4 MG/ML IV SOLN
4.0000 mg | Freq: Once | INTRAVENOUS | Status: AC
  Administered 2022-02-06: 4 mg via INTRAVENOUS
  Filled 2022-02-06: qty 1

## 2022-02-06 MED ORDER — ACETAMINOPHEN 650 MG RE SUPP: 650.0000 mg | Freq: Four times a day (QID) | RECTAL | Status: DC | PRN

## 2022-02-06 MED ORDER — FENTANYL CITRATE PF 50 MCG/ML IJ SOSY: 25.0000 ug | PREFILLED_SYRINGE | INTRAMUSCULAR | Status: DC | PRN

## 2022-02-06 MED ORDER — LACTATED RINGERS IV SOLN: INTRAVENOUS | Status: AC

## 2022-02-06 MED ORDER — ONDANSETRON HCL 4 MG PO TABS
4.0000 mg | ORAL_TABLET | Freq: Four times a day (QID) | ORAL | Status: DC | PRN
  Administered 2022-02-08 – 2022-02-10 (×5): 4 mg via ORAL
  Filled 2022-02-06 (×5): qty 1

## 2022-02-06 MED ORDER — CIPROFLOXACIN IN D5W 400 MG/200ML IV SOLN
400.0000 mg | Freq: Once | INTRAVENOUS | Status: AC
  Administered 2022-02-06: 400 mg via INTRAVENOUS
  Filled 2022-02-06: qty 200

## 2022-02-06 MED ORDER — HEPARIN SODIUM (PORCINE) 5000 UNIT/ML IJ SOLN
5000.0000 [IU] | Freq: Three times a day (TID) | INTRAMUSCULAR | Status: DC
  Administered 2022-02-06 – 2022-02-10 (×12): 5000 [IU] via SUBCUTANEOUS
  Filled 2022-02-06 (×12): qty 1

## 2022-02-06 MED ORDER — ALBUTEROL SULFATE HFA 108 (90 BASE) MCG/ACT IN AERS: 1.0000 | INHALATION_SPRAY | Freq: Four times a day (QID) | RESPIRATORY_TRACT | Status: DC | PRN

## 2022-02-06 MED ORDER — METRONIDAZOLE 500 MG/100ML IV SOLN
500.0000 mg | Freq: Two times a day (BID) | INTRAVENOUS | Status: DC
  Administered 2022-02-07 – 2022-02-10 (×7): 500 mg via INTRAVENOUS
  Filled 2022-02-06 (×8): qty 100

## 2022-02-06 MED ORDER — ONDANSETRON HCL 4 MG/2ML IJ SOLN
4.0000 mg | Freq: Once | INTRAMUSCULAR | Status: AC
  Administered 2022-02-06: 4 mg via INTRAVENOUS
  Filled 2022-02-06: qty 2

## 2022-02-06 MED ORDER — METRONIDAZOLE 500 MG/100ML IV SOLN
500.0000 mg | Freq: Once | INTRAVENOUS | Status: AC
  Administered 2022-02-06: 500 mg via INTRAVENOUS
  Filled 2022-02-06: qty 100

## 2022-02-06 MED ORDER — IOHEXOL 300 MG/ML  SOLN
100.0000 mL | Freq: Once | INTRAMUSCULAR | Status: AC | PRN
  Administered 2022-02-06: 100 mL via INTRAVENOUS

## 2022-02-06 MED ORDER — ONDANSETRON HCL 4 MG/2ML IJ SOLN
4.0000 mg | Freq: Four times a day (QID) | INTRAMUSCULAR | Status: DC | PRN
  Administered 2022-02-06 – 2022-02-09 (×5): 4 mg via INTRAVENOUS
  Filled 2022-02-06 (×5): qty 2

## 2022-02-06 MED ORDER — MORPHINE SULFATE (PF) 4 MG/ML IV SOLN
4.0000 mg | INTRAVENOUS | Status: AC | PRN
  Administered 2022-02-06 – 2022-02-07 (×4): 4 mg via INTRAVENOUS
  Filled 2022-02-06 (×4): qty 1

## 2022-02-06 MED ORDER — FLUTICASONE FUROATE-VILANTEROL 100-25 MCG/ACT IN AEPB
1.0000 | INHALATION_SPRAY | Freq: Every day | RESPIRATORY_TRACT | Status: DC
  Administered 2022-02-07 – 2022-02-10 (×4): 1 via RESPIRATORY_TRACT
  Filled 2022-02-06: qty 28

## 2022-02-06 MED ORDER — PNEUMOCOCCAL 20-VAL CONJ VACC 0.5 ML IM SUSY
0.5000 mL | PREFILLED_SYRINGE | INTRAMUSCULAR | Status: DC
  Filled 2022-02-06: qty 0.5

## 2022-02-06 NOTE — Assessment & Plan Note (Signed)
-  LR 125 mL/h, 1 day ordered - Continue with metronidazole and ciprofloxacin - Symptomatic support: Morphine 4 mg IV every 4 hours as needed for moderate pain, 4 doses ordered; fentanyl 25 mcg IV every 4 hours as needed for severe pain, 3 doses ordered - AM team to reevaluate patient at bedside for continued opioid requirements - Admit to telemetry medical, observation

## 2022-02-06 NOTE — Assessment & Plan Note (Addendum)
-  Patient reports she has never had a colonoscopy - I recommend that she have one to evaluate her chronic diarrhea of two years - Patient endorses understanding and compliance

## 2022-02-06 NOTE — H&P (Addendum)
History and Physical   Stacy Gilmore GMW:[id number] DOB: 11/08/1960 DOA: 02/06/2022  PCP: Pcp, No  Patient coming from: Home  I have personally briefly reviewed patient's old medical records in [place].  Chief Concern: Abdominal pain  HPI: Stacy Gilmore is a 62 year old female with history of GERD, asthma, COPD, who presents emergency department for chief concerns of abdominal pain.  Initial vitals in the ED showed temperature of 98.1, respiration rate of 18, heart rate of 85, blood pressure 126/75, O2 saturation is 95% on room air.  Serum sodium is 135, potassium 3.9, chloride 98, bicarb 26, BUN of 12, serum creatinine of 0.63, EGFR greater than 60, nonfasting blood glucose 112.  WBC 13.5, hemoglobin 13.5, platelets of 258.  ED treatment: Tylenol 650 mg p.o. one-time dose, morphine 4 mg IV, ondansetron 4 mg IV, ciprofloxacin 400 mg IV, metronidazole 500 mg IV. ------------------------ At bedside, she is able to tell me her name, age, current calendar, current location of [place].  She reports left lower abdominal pain started about 4 days, dull and sometimes sharp pain. She reports no bowel movement in 4 days. She denies trauma to her person.  She denies fever, chest pain, shortness of breath, blood in her stool. She endorses nausea and denies vomiting. She endorses abdominal pain with urination.   She reports chronic diarrhea that has been ongoing for at least 2 years.  She reports that over the last 4 days she thought she was constipated as she has not had a bowel movement in 4 days.  Social history: She lives with her boyfriend, Stacy Gilmore. She endorses tobacco use, 1/2 ppd. At her peak, she smokes 1 ppd. She endorses etoh use, infrequent. She last had a drink, 6 beers last night. She denies history of tremors. She endorses cocaine use, last used evening of 02/05/22 for pain control.  ROS: Constitutional: no weight change, no fever ENT/Mouth: no sore  throat, no rhinorrhea Eyes: no eye pain, no vision changes Cardiovascular: no chest pain, no dyspnea,  no edema, no palpitations Respiratory: no cough, no sputum, no wheezing Gastrointestinal: + nausea, no vomiting, + diarrhea, + constipation Genitourinary: no urinary incontinence, no dysuria, no hematuria Musculoskeletal: no arthralgias, no myalgias Skin: no skin lesions, no pruritus, Neuro: + weakness, no loss of consciousness, no syncope Psych: no anxiety, no depression, + decrease appetite Heme/Lymph: no bruising, no bleeding  ED Course: Discussed with emergency medicine provider, patient requiring hospitalization for chief concerns of diverticulitis and abdominal pain.  Assessment/Plan  Principal Problem:   Diverticulitis Active Problems:   History of asthma   History of COPD   History of gastroesophageal reflux (GERD)   Diarrhea   Cocaine use   Assessment and Plan:  * Diverticulitis - LR 125 mL/h, 1 day ordered - Continue with metronidazole and ciprofloxacin - Symptomatic support: Morphine 4 mg IV every 4 hours as needed for moderate pain, 4 doses ordered; fentanyl 25 mcg IV every 4 hours as needed for severe pain, 3 doses ordered - AM team to reevaluate patient at bedside for continued opioid requirements - Admit to telemetry medical, observation  Cocaine use - Patient self medicate with cocaine whenever she feels pain in her body - Counseled patient on cocaine cessation and that she needs to present to the emergency department and for her outpatient doctor to be evaluated for pain as this is an indication that something may be off - Patient endorses understanding and compliance  Diarrhea - Patient reports she has  never had a colonoscopy - I recommend that she have one to evaluate her chronic diarrhea of two years - Patient endorses understanding and compliance  History of COPD - Does not appear to be in acute exacerbation at this time  History of asthma -  Albuterol nebulizer every 6 hours as needed for wheezing and shortness of breath, 4 days ordered  Chart reviewed.   DVT prophylaxis: Heparin per pharmacy Code Status: full code  Diet: N.p.o. with instructions for nursing to be able to advance diet to clear liquids as tolerated Family Communication: no  Disposition Plan: Pending clinical course Consults called: none at this time Admission status: Telemetry medical, observation  Past Medical History:  Diagnosis Date   Asthma    COPD (chronic obstructive pulmonary disease) ([place])    GERD (gastroesophageal reflux disease)    History of diverticulitis    Past Surgical History:  Procedure Laterality Date   CERVICAL SPINE SURGERY     CHOLECYSTECTOMY     TUBAL LIGATION     Social History:  reports that she has been smoking cigarettes. She has a 20.00 pack-year smoking history. She has never used smokeless tobacco. She reports current alcohol use of about 8.0 standard drinks of alcohol per week. She reports current drug use. Drugs: "Crack" cocaine, Cocaine, Methamphetamines, and Marijuana.  Allergies  Allergen Reactions   Bee Venom Swelling   Penicillins Hives and Swelling   Armoracia Rusticana Ext (Horseradish) Rash    Blisters the mouth   Other Rash    Ragu spaghetti sauce   Family History  Problem Relation Age of Onset   Anxiety disorder Mother    Lung cancer Mother    Thyroid disease Mother    Epilepsy Mother    Other Father        unknown medical history   Breast cancer Sister    Cervical cancer Sister    Epilepsy Brother    Other Maternal Grandmother        unknown medical history   Other Maternal Grandfather        unknown medical history   Diabetes Paternal Grandmother    Other Paternal Grandfather        unknown medical history   Family history: Family history reviewed and not pertinent  Prior to Admission medications   Medication Sig Start Date End Date Taking? Authorizing Provider  albuterol (VENTOLIN HFA)  108 (90 Base) MCG/ACT inhaler Inhale 1-2 puffs into the lungs every 6 (six) hours as needed for wheezing or shortness of breath. 12/11/21  Yes Iloabachie, Chioma E, NP  fluticasone furoate-vilanterol (BREO ELLIPTA) 100-25 MCG/ACT AEPB Inhale one puff into the lungs daily 12/20/21  Yes Iloabachie, Chioma E, NP  naproxen (NAPROSYN) 500 MG tablet Take 1 tablet (500 mg total) by mouth 2 (two) times daily with a meal. 01/17/22  Yes Iloabachie, Chioma E, NP  omeprazole (PRILOSEC) 20 MG capsule Take 1 capsule (20 mg total) by mouth daily. 12/11/21  Yes Iloabachie, Chioma E, NP  fluticasone furoate-vilanterol (BREO ELLIPTA) 100-25 MCG/ACT AEPB Inhale 1 puff into the lungs daily. Patient not taking: Reported on 01/17/2022 12/11/21   Langston Reusing, NP   Physical Exam: Vitals:   02/06/22 1630 02/06/22 1730 02/06/22 1826 02/06/22 1937  BP: 104/69 115/76 109/74 100/67  Pulse: 77 74 77 75  Resp: 20 20 16 16   Temp:  98.2 F (36.8 C) 97.9 F (36.6 C) 98 F (36.7 C)  TempSrc:  Oral Oral Oral  SpO2: 93% 92% 94%  94%  Weight:      Height:       Constitutional: appears older than chronological age, frail, NAD, calm, comfortable Eyes: PERRL, lids and conjunctivae normal ENMT: Mucous membranes are moist. Posterior pharynx clear of any exudate or lesions. Age-appropriate dentition. Hearing appropriate Neck: normal, supple, no masses, no thyromegaly Respiratory: clear to auscultation bilaterally, no wheezing, no crackles. Normal respiratory effort. No accessory muscle use.  Cardiovascular: Regular rate and rhythm, no murmurs / rubs / gallops. No extremity edema. 2+ pedal pulses. No carotid bruits.  Abdomen: + LLQ tenderness, no masses palpated, no hepatosplenomegaly. Bowel sounds positive.  Musculoskeletal: no clubbing / cyanosis. No joint deformity upper and lower extremities. Good ROM, no contractures, no atrophy. Normal muscle tone.  Skin: no rashes, lesions, ulcers. No induration Neurologic: Sensation  intact. Strength 5/5 in all 4.  Psychiatric: Normal judgment and insight. Alert and oriented x 3. Normal mood.   EKG: independently reviewed, showing sinus rhythm with rate of 87, QTc 438  Chest x-ray on Admission: I personally reviewed and I agree with radiologist reading as below.  CT ABDOMEN PELVIS W CONTRAST  Result Date: 02/06/2022 CLINICAL DATA:  Acute abdominal pain, lack of bowel movement over the last 4 days. EXAM: CT ABDOMEN AND PELVIS WITH CONTRAST TECHNIQUE: Multidetector CT imaging of the abdomen and pelvis was performed using the standard protocol following bolus administration of intravenous contrast. RADIATION DOSE REDUCTION: This exam was performed according to the departmental dose-optimization program which includes automated exposure control, adjustment of the mA and/or kV according to patient size and/or use of iterative reconstruction technique. CONTRAST:  OMNIPAQUE IOHEXOL 300 MG/ML  SOLN COMPARISON:  05/03/2013 FINDINGS: Lower chest: Left anterior descending, right, and circumflex coronary artery atherosclerotic vascular calcifications. Hepatobiliary: Cholecystectomy. Mildly prominent common hepatic duct at 9 mm with reasonably normal caliber common bile duct, overall likely a physiologic response to cholecystectomy. No significant abnormal hepatic parenchymal lesion identified. Pancreas: Unremarkable Spleen: Unremarkable Adrenals/Urinary Tract: Fullness of both adrenal glands without discrete adrenal mass. The kidneys appear unremarkable. Stomach/Bowel: Abnormal wall thickening at the junction of the descending and sigmoid colon with inflamed anterior diverticulum shown on image 88 series 6, compatible with a moderate degree of acute diverticulitis. The wall thickening in this region extends over a 4.2 cm segment. No extraluminal gas or abscess. Surrounding moderate mesenteric stranding along with a trace amount of fluid in the left paracolic gutter noted. In addition there is  concurrent inflamed diverticulum of the distal sigmoid colon shown on image 69 of series 2, with mild to moderate surrounding local inflammatory findings but no discrete abscess. A tiny locally contained micro perforation is not totally excluded given the punctate gas densities along the anterior margin of the diverticulum, but more likely these are contained within the diverticulum. Vascular/Lymphatic: Mild aortoiliac atherosclerotic vascular calcification. Reproductive: Unremarkable Other: No supplemental non-categorized findings. Musculoskeletal: Small umbilical hernia contains adipose tissue. IMPRESSION: 1. Acute diverticulitis at the junction of the descending and sigmoid colon, with moderate surrounding inflammatory findings but no discrete abscess or extraluminal gas. 2. Additional separate focus of acute diverticulitis of the distal sigmoid colon, with mild to moderate surrounding inflammatory findings but no discrete abscess. I favor that the small amount of gas along the inflamed diverticulum is contained within the diverticulum rather than this representing a contained micro perforation. 3. Coronary atherosclerosis. 4. Small umbilical hernia contains adipose tissue. 5. Mildly prominent common hepatic duct at 9 mm, overall normal caliber common bile duct, overall likely a physiologic  response to cholecystectomy. Aortic Atherosclerosis (ICD10-I70.0). Electronically Signed   By: Gaylyn Rong M.D.   On: 02/06/2022 15:57    Labs on Admission: I have personally reviewed following labs  CBC: Recent Labs  Lab 02/06/22 1145  WBC 13.5*  HGB 13.5  HCT 41.6  MCV 85.1  PLT 258   Basic Metabolic Panel: Recent Labs  Lab 02/06/22 1145  NA 135  K 3.9  CL 98  CO2 26  GLUCOSE 112*  BUN 12  CREATININE 0.63  CALCIUM 9.3   GFR: Estimated Creatinine Clearance: 71.4 mL/min (by C-G formula based on SCr of 0.63 mg/dL).  Liver Function Tests: Recent Labs  Lab 02/06/22 1145  AST 45*  ALT 47*   ALKPHOS 67  BILITOT 0.9  PROT 7.4  ALBUMIN 3.6   Recent Labs  Lab 02/06/22 1145  LIPASE 47   Urine analysis:    Component Value Date/Time   COLORURINE AMBER (A) 02/06/2022 1145   APPEARANCEUR HAZY (A) 02/06/2022 1145   APPEARANCEUR Cloudy (A) 01/17/2022 1348   LABSPEC 1.018 02/06/2022 1145   LABSPEC 1.009 04/16/2013 0735   PHURINE 5.0 02/06/2022 1145   GLUCOSEU NEGATIVE 02/06/2022 1145   GLUCOSEU Negative 04/16/2013 0735   HGBUR NEGATIVE 02/06/2022 1145   BILIRUBINUR NEGATIVE 02/06/2022 1145   BILIRUBINUR Negative 01/17/2022 1348   BILIRUBINUR Negative 04/16/2013 0735   KETONESUR NEGATIVE 02/06/2022 1145   PROTEINUR 30 (A) 02/06/2022 1145   NITRITE POSITIVE (A) 02/06/2022 1145   LEUKOCYTESUR TRACE (A) 02/06/2022 1145   LEUKOCYTESUR 2+ 04/16/2013 0735   This document was prepared using Dragon Voice Recognition software and may include unintentional dictation errors.  Dr. Sedalia Muta [facility]  If 7PM-7AM, please contact overnight-coverage provider If 7AM-7PM, please contact day coverage provider [email or web address]  02/06/2022, 8:42 PM

## 2022-02-06 NOTE — Congregational Nurse Program (Signed)
  Dept: [phone]   Congregational Nurse Program Note  Date of Encounter: 02/06/2022 Client to [place] to see this RN, she was having so much abdominal pain she called [facility] EMS herself from the center. RN was able to speak with the 911 operator to give address ans wait for EMS personnel to arrive. Patient reporting pain to her left upper an lower quadrants, she has her appendix, but has had previous cholecystectomy. EMS assessed patient and she was transported to [facility] ED for evaluation.  Past Medical History: Past Medical History:  Diagnosis Date   Asthma    COPD (chronic obstructive pulmonary disease) ([place])    GERD (gastroesophageal reflux disease)    History of diverticulitis     Encounter Details:  CNP Questionnaire - 02/06/22 1045       Questionnaire   Ask client: Do you give verbal consent for me to treat you today? Yes    Student Assistance N/A    Location Patient [place]    Visit Setting with Client Organization    Patient Status Unknown   client currenlty lives in a rented house   Insurance Uninsured ([place] Card/Care Connects/Self-Pay/Medicaid Family Planning)    Insurance/Financial Assistance Referral N/A    Medication N/A   medications provided through [facility] Provider Yes   Client is now a patient at [facility]   Screening Referrals [place] Appointment Made N/A    Recently w/o PCP, now 1st time PCP visit completed due to CNs referral or appointment made N/A   patient at [facility]   Food N/A   recently started receiving food stamps   Transportation N/A   Client given bus pass for [employer] [facility] for travel to [facility] apt today. [employer] bus now costs 1.00 each way   Housing/Utilities N/A    Interpersonal Safety N/A    Interventions Advocate/Support    Abnormal to Normal Screening Since Last CN Visit N/A    Screenings CN Performed N/A    Sent Client to Lab  for: N/A    Did client attend any of the following based off CNs referral or appointments made? N/A    ED Visit Averted N/A   client was sent to the [facility] ED d/t abdominal pain   Life-Saving Intervention Made N/A

## 2022-02-06 NOTE — ED Provider Notes (Signed)
[facility] Provider Note    Event Date/Time   First MD Initiated Contact with Patient 02/06/22 1501     (approximate)   History   Abdominal Pain   HPI  Stacy Gilmore is a 62 y.o. female with a history of COPD, GERD who presents with complaints of abdominal pain.  Patient reports he has not had a bowel movement in 4 days, she complains of diffuse abdominal pain, some pain in her lower abdomen when she urinates as well.  No fevers reported although she has had some chills.  Positive nausea no vomiting    Physical Exam   Triage Vital Signs: ED Triage Vitals  Enc Vitals Group     BP 02/06/22 1135 126/75     Pulse Rate 02/06/22 1135 85     Resp 02/06/22 1135 18     Temp 02/06/22 1135 98.1 F (36.7 C)     Temp Source 02/06/22 1135 Oral     SpO2 02/06/22 1135 95 %     Weight 02/06/22 1136 70.8 kg (156 lb)     Height 02/06/22 1136 1.689 m (5' 6.5")     Head Circumference --      Peak Flow --      Pain Score 02/06/22 1136 10     Pain Loc --      Pain Edu? --      Excl. in [place]? --     Most recent vital signs: Vitals:   02/06/22 1600 02/06/22 1630  BP: 108/62 104/69  Pulse: 84 77  Resp:  20  Temp:    SpO2: 91% 93%     General: Awake, no distress.  CV:  Good peripheral perfusion.  Resp:  Normal effort.  Abd:  Positive distention, tenderness primarily in the in the right lower quadrant and left lower quadrant Other:     ED Results / Procedures / Treatments   Labs (all labs ordered are listed, but only abnormal results are displayed) Labs Reviewed  COMPREHENSIVE METABOLIC PANEL - Abnormal; Notable for the following components:      Result Value   Glucose, Bld 112 (*)    AST 45 (*)    ALT 47 (*)    All other components within normal limits  CBC - Abnormal; Notable for the following components:   WBC 13.5 (*)    All other components within normal limits  URINALYSIS, ROUTINE W REFLEX MICROSCOPIC - Abnormal; Notable for the  following components:   Color, Urine AMBER (*)    APPearance HAZY (*)    Protein, ur 30 (*)    Nitrite POSITIVE (*)    Leukocytes,Ua TRACE (*)    Bacteria, UA MANY (*)    All other components within normal limits  LIPASE, BLOOD     EKG     RADIOLOGY CT abdomen pelvis is consistent with diverticulitis and multiple areas    PROCEDURES:  Critical Care performed:   Procedures   MEDICATIONS ORDERED IN ED: Medications  ciprofloxacin (CIPRO) IVPB 400 mg (has no administration in time range)  metroNIDAZOLE (FLAGYL) IVPB 500 mg (has no administration in time range)  acetaminophen (TYLENOL) tablet 650 mg (650 mg Oral Given 02/06/22 1447)  morphine (PF) 4 MG/ML injection 4 mg (4 mg Intravenous Given 02/06/22 1519)  ondansetron (ZOFRAN) injection 4 mg (4 mg Intravenous Given 02/06/22 1514)  iohexol (OMNIPAQUE) 300 MG/ML solution 100 mL (100 mLs Intravenous Contrast Given 02/06/22 1539)     IMPRESSION / MDM /  ASSESSMENT AND PLAN / ED COURSE  I reviewed the triage vital signs and the nursing notes. Patient's presentation is most consistent with acute presentation with potential threat to life or bodily function.  Patient presents with abdominal pain as above.  Differential includes constipation, small bowel obstruction, UTI, diverticulitis  Significant tenderness on exam, will treat with IV morphine, IV Zofran, IV fluids, lab work reviewed and is overall reassuring, will obtain CT abdomen pelvis  CT scan is consistent with diverticulitis, no evidence of clear perforation.  Patient continues to have significant pain, she has an elevated white blood cell count, I feel she will require admission for further treatment      FINAL CLINICAL IMPRESSION(S) / ED DIAGNOSES   Final diagnoses:  Diverticulitis     Rx / DC Orders   ED Discharge Orders     None        Note:  This document was prepared using Dragon voice recognition software and may include unintentional  dictation errors.   Lavonia Drafts, MD 02/06/22 [phone]

## 2022-02-06 NOTE — ED Triage Notes (Signed)
Please see First Nurse note. Patient states she took a laxative two days ago and hasn't had a bowel movement. Patient states she is nauseous and is dizzy sitting and standing. Patient states she has been able to drink "a lot of water" and had a hamburger last night.

## 2022-02-06 NOTE — ED Triage Notes (Signed)
First Nurse Note  [place] [facility] [place]. Pt c/o abd pain, hasn't have a BM in 4 days. Pt has a hx of GERD, COPD, and asthma. Pt is A&Ox4 and NAD 98.0  124 CBG  89 HR  95% on RA 134/84

## 2022-02-06 NOTE — ED Notes (Signed)
Pt to Ed with husband, constipated for 4 days, took ex lax 2 days ago, LBM was 4d ago. Complains of lower abdominal pain and upper. States is drinking lots of water and is dry heaving.

## 2022-02-06 NOTE — Assessment & Plan Note (Signed)
>>  ASSESSMENT AND PLAN FOR HISTORY OF COPD WRITTEN ON 02/06/2022  5:51 PM BY COX, AMY N, DO  - Does not appear to be in acute exacerbation at this time

## 2022-02-06 NOTE — Assessment & Plan Note (Signed)
-  Albuterol nebulizer every 6 hours as needed for wheezing and shortness of breath, 4 days ordered

## 2022-02-06 NOTE — Assessment & Plan Note (Signed)
-  Patient self medicate with cocaine whenever she feels pain in her body - Counseled patient on cocaine cessation and that she needs to present to the emergency department and for her outpatient doctor to be evaluated for pain as this is an indication that something may be off - Patient endorses understanding and compliance

## 2022-02-06 NOTE — Assessment & Plan Note (Signed)
-  Does not appear to be in acute exacerbation at this time 

## 2022-02-06 NOTE — Hospital Course (Addendum)
Stacy Gilmore is a 62 year old female with history of GERD, asthma, COPD, who [place] [facility] to the emergency department 02/06/22 for chief concerns of abdominal pain, constipation, nausea, dizziness. Of note, UA and UTI Tx 01/04 after she had not started abx for UTI in 11/23, and on 01/04 saw PCP and culture had to be canceled d/t specimen not refrigerated, no symptoms so was not treated  01/24: in ED, VSS, serum WBC 13.5, CMP mildly elevated AST/ALT, UA trace leuks (+)nitrite, 21-50 WBC, CT abd/pelv w contrast (+)diverticulitis w/o abscess/perforation, Tylenol 650 mg p.o. one-time dose, morphine 4 mg IV, ondansetron 4 mg IV, ciprofloxacin 400 mg IV, metronidazole 500 mg IV. Admitted to hospitalist service.  01/25: added UCx to previously collected specimen. Still on IV fluids and CLD. No BM, started bowel regimen. She reports pain is about same as yesterday 01/26: UCx (+)Ecoli pending susceptibilities. Pain stable, cramping. BM this afternoon, watery, still abdominal pain.        Consultants:  none  Procedures: none      ASSESSMENT & PLAN:   Principal Problem:   Diverticulitis Active Problems:   History of asthma   History of COPD   History of gastroesophageal reflux (GERD)   Diarrhea   Cocaine use   Diverticulitis w/o abscess/perforation  LR 125 mL/h, 1 day ordered --> tolerating clear diet  Continue with metronidazole and ciprofloxacin Symptomatic support: itching w/ po oxycodone, reduced fentanyl, added toradol  encourage non-opiate pain control if at all possible  UTI UCx pending suscept but (+)Ecoli, anticipate results tomorrow  Abx as above - cipro should cover common pathogens   History of asthma History of COPD Does not appear to be in acute exacerbation at this time Albuterol nebulizer every 6 hours as needed for wheezing and shortness of breath, 4 days ordered  Chronic Diarrhea Acute constipation Abdominal pain d/t diverticulitis w/o  abscess/perforation  Patient reports she has never had a colonoscopy May need outpatient GI follow up or consult inpatient if worse / not improving  Bowel regimen here   Cocaine use Patient self medicate with cocaine whenever she feels pain in her body Counseled patient on cocaine cessation and that she needs to present to the emergency department and for her outpatient doctor to be evaluated for pain as this is an indication that something may be off. Patient endorses understanding and compliance    DVT prophylaxis: heparin Pertinent IV fluids/nutrition: LR as above, CLD advance as tolerated  Central lines / invasive devices: none  Code Status: FULL CODE   Current Admission Status: observation, will likely need inpatient if continued need for IV therapies  TOC needs / Dispo plan: no needs at this time, anticipate d/c back home  Barriers to discharge / significant pending items: requiring IV therapies as above, significant abdominal pain

## 2022-02-06 NOTE — ED Provider Triage Note (Signed)
Emergency Medicine Provider Triage Evaluation Note  OTHELIA RIEDERER , a 62 y.o. female  was evaluated in triage.  Pt complains of llq pain x4 days. No BM. No passing gas. Reports history of cholecystectomy. Nio fever. Nausea, and dry heaving.  Review of Systems  Positive: Abd pain, n/v Negative: fever  Physical Exam  BP 126/75 (BP Location: Left Arm)   Pulse 85   Temp 98.1 F (36.7 C) (Oral)   Resp 18   Ht 5' 6.5" (1.689 m)   Wt 70.8 kg   SpO2 95%   BMI 24.80 kg/m  Gen:   Awake, no distress   Resp:  Normal effort  MSK:   Moves extremities without difficulty  Other:    Medical Decision Making  Medically screening exam initiated at 11:43 AM.  Appropriate orders placed.  TANICIA WOLAVER was informed that the remainder of the evaluation will be completed by another provider, this initial triage assessment does not replace that evaluation, and the importance of remaining in the ED until their evaluation is complete.     Marquette Old, PA-C 02/06/22 1144

## 2022-02-07 DIAGNOSIS — Z820 Family history of epilepsy and other diseases of the nervous system: Secondary | ICD-10-CM | POA: Diagnosis not present

## 2022-02-07 DIAGNOSIS — R54 Age-related physical debility: Secondary | ICD-10-CM | POA: Diagnosis present

## 2022-02-07 DIAGNOSIS — I2510 Atherosclerotic heart disease of native coronary artery without angina pectoris: Secondary | ICD-10-CM | POA: Diagnosis present

## 2022-02-07 DIAGNOSIS — Z91030 Bee allergy status: Secondary | ICD-10-CM | POA: Diagnosis not present

## 2022-02-07 DIAGNOSIS — F17210 Nicotine dependence, cigarettes, uncomplicated: Secondary | ICD-10-CM | POA: Diagnosis present

## 2022-02-07 DIAGNOSIS — Z888 Allergy status to other drugs, medicaments and biological substances status: Secondary | ICD-10-CM | POA: Diagnosis not present

## 2022-02-07 DIAGNOSIS — Z7951 Long term (current) use of inhaled steroids: Secondary | ICD-10-CM | POA: Diagnosis not present

## 2022-02-07 DIAGNOSIS — Z9049 Acquired absence of other specified parts of digestive tract: Secondary | ICD-10-CM | POA: Diagnosis not present

## 2022-02-07 DIAGNOSIS — F1490 Cocaine use, unspecified, uncomplicated: Secondary | ICD-10-CM | POA: Diagnosis present

## 2022-02-07 DIAGNOSIS — I700 Atherosclerosis of aorta: Secondary | ICD-10-CM | POA: Diagnosis present

## 2022-02-07 DIAGNOSIS — Z803 Family history of malignant neoplasm of breast: Secondary | ICD-10-CM | POA: Diagnosis not present

## 2022-02-07 DIAGNOSIS — Z880 Allergy status to penicillin: Secondary | ICD-10-CM | POA: Diagnosis not present

## 2022-02-07 DIAGNOSIS — Z79899 Other long term (current) drug therapy: Secondary | ICD-10-CM | POA: Diagnosis not present

## 2022-02-07 DIAGNOSIS — Z818 Family history of other mental and behavioral disorders: Secondary | ICD-10-CM | POA: Diagnosis not present

## 2022-02-07 DIAGNOSIS — Z8049 Family history of malignant neoplasm of other genital organs: Secondary | ICD-10-CM | POA: Diagnosis not present

## 2022-02-07 DIAGNOSIS — Z833 Family history of diabetes mellitus: Secondary | ICD-10-CM | POA: Diagnosis not present

## 2022-02-07 DIAGNOSIS — B9620 Unspecified Escherichia coli [E. coli] as the cause of diseases classified elsewhere: Secondary | ICD-10-CM | POA: Diagnosis present

## 2022-02-07 DIAGNOSIS — K5900 Constipation, unspecified: Secondary | ICD-10-CM | POA: Diagnosis present

## 2022-02-07 DIAGNOSIS — Z801 Family history of malignant neoplasm of trachea, bronchus and lung: Secondary | ICD-10-CM | POA: Diagnosis not present

## 2022-02-07 DIAGNOSIS — K5732 Diverticulitis of large intestine without perforation or abscess without bleeding: Secondary | ICD-10-CM | POA: Diagnosis not present

## 2022-02-07 DIAGNOSIS — K5792 Diverticulitis of intestine, part unspecified, without perforation or abscess without bleeding: Secondary | ICD-10-CM | POA: Diagnosis present

## 2022-02-07 DIAGNOSIS — J4489 Other specified chronic obstructive pulmonary disease: Secondary | ICD-10-CM | POA: Diagnosis present

## 2022-02-07 DIAGNOSIS — K219 Gastro-esophageal reflux disease without esophagitis: Secondary | ICD-10-CM | POA: Diagnosis present

## 2022-02-07 DIAGNOSIS — N390 Urinary tract infection, site not specified: Secondary | ICD-10-CM | POA: Diagnosis present

## 2022-02-07 DIAGNOSIS — Z8349 Family history of other endocrine, nutritional and metabolic diseases: Secondary | ICD-10-CM | POA: Diagnosis not present

## 2022-02-07 LAB — CBC
HCT: 32.9 % — ABNORMAL LOW (ref 36.0–46.0)
Hemoglobin: 10.8 g/dL — ABNORMAL LOW (ref 12.0–15.0)
MCH: 27.9 pg (ref 26.0–34.0)
MCHC: 32.8 g/dL (ref 30.0–36.0)
MCV: 85 fL (ref 80.0–100.0)
Platelets: 198 10*3/uL (ref 150–400)
RBC: 3.87 MIL/uL (ref 3.87–5.11)
RDW: 12.7 % (ref 11.5–15.5)
WBC: 7.4 10*3/uL (ref 4.0–10.5)
nRBC: 0 % (ref 0.0–0.2)

## 2022-02-07 LAB — BASIC METABOLIC PANEL
Anion gap: 12 (ref 5–15)
BUN: 7 mg/dL (ref 6–20)
CO2: 24 mmol/L (ref 22–32)
Calcium: 8.4 mg/dL — ABNORMAL LOW (ref 8.9–10.3)
Chloride: 97 mmol/L — ABNORMAL LOW (ref 98–111)
Creatinine, Ser: 0.53 mg/dL (ref 0.44–1.00)
GFR, Estimated: >60 mL/min (ref >60)
Glucose, Bld: 114 mg/dL — ABNORMAL HIGH (ref 70–99)
Potassium: 3.2 mmol/L — ABNORMAL LOW (ref 3.5–5.1)
Sodium: 133 mmol/L — ABNORMAL LOW (ref 135–145)

## 2022-02-07 MED ORDER — POLYETHYLENE GLYCOL 3350 17 G PO PACK
17.0000 g | PACK | Freq: Two times a day (BID) | ORAL | Status: AC
  Administered 2022-02-07 (×2): 17 g via ORAL
  Filled 2022-02-07 (×2): qty 1

## 2022-02-07 MED ORDER — KETOROLAC TROMETHAMINE 30 MG/ML IJ SOLN
30.0000 mg | Freq: Three times a day (TID) | INTRAMUSCULAR | Status: AC
  Administered 2022-02-07 – 2022-02-09 (×6): 30 mg via INTRAVENOUS
  Filled 2022-02-07 (×6): qty 1

## 2022-02-07 MED ORDER — DIPHENHYDRAMINE HCL 25 MG PO CAPS
25.0000 mg | ORAL_CAPSULE | Freq: Four times a day (QID) | ORAL | Status: DC | PRN
  Administered 2022-02-07: 50 mg via ORAL
  Filled 2022-02-07: qty 2

## 2022-02-07 MED ORDER — FENTANYL CITRATE PF 50 MCG/ML IJ SOSY
12.5000 ug | PREFILLED_SYRINGE | INTRAMUSCULAR | Status: AC | PRN
  Administered 2022-02-07 – 2022-02-08 (×3): 12.5 ug via INTRAVENOUS
  Filled 2022-02-07 (×3): qty 1

## 2022-02-07 MED ORDER — OXYCODONE HCL 5 MG PO TABS
10.0000 mg | ORAL_TABLET | ORAL | Status: DC | PRN
  Administered 2022-02-07: 10 mg via ORAL
  Filled 2022-02-07: qty 2

## 2022-02-07 MED ORDER — POTASSIUM CHLORIDE 10 MEQ/100ML IV SOLN
10.0000 meq | INTRAVENOUS | Status: AC
  Administered 2022-02-07 (×2): 10 meq via INTRAVENOUS
  Filled 2022-02-07: qty 100

## 2022-02-07 MED ORDER — FENTANYL CITRATE PF 50 MCG/ML IJ SOSY: 25.0000 ug | PREFILLED_SYRINGE | INTRAMUSCULAR | Status: DC | PRN

## 2022-02-07 MED ORDER — SENNOSIDES-DOCUSATE SODIUM 8.6-50 MG PO TABS
2.0000 | ORAL_TABLET | Freq: Two times a day (BID) | ORAL | Status: AC
  Administered 2022-02-07 (×2): 2 via ORAL
  Filled 2022-02-07 (×2): qty 2

## 2022-02-07 NOTE — TOC Initial Note (Signed)
Transition of Care [facility]) - Initial/Assessment Note    Patient Details  Name: Stacy Gilmore MRN: [id number] Date of Birth: 09-04-1960  Transition of Care [facility]) CM/SW Contact:    Beverly Sessions, RN Phone Number: 02/07/2022, 2:20 PM  Clinical Narrative:                  Patient admitted for Diverticulitis  Patient listed as self pay.  Patient established with [facility] in November 2023.  Patient has follow up appointment scheduled for 02/14/22. Obtains medications from [facility] outpatient pharmacy.    Transition of Care [facility]) Screening Note   Patient Details  Name: Stacy Gilmore Date of Birth: [birth year]   Transition of Care [facility]) CM/SW Contact:    Beverly Sessions, RN Phone Number: 02/07/2022, 2:22 PM    Transition of Care Department [facility]) has reviewed patient and no TOC needs have been identified at this time. We will continue to monitor patient advancement through interdisciplinary progression rounds. If new patient transition needs arise, please place a TOC consult.         Patient Goals and CMS Choice            Expected Discharge Plan and Services                                              Prior Living Arrangements/Services                       Activities of Daily Living Home Assistive Devices/Equipment: None ADL Screening (condition at time of admission) Patient's cognitive ability adequate to safely complete daily activities?: Yes Is the patient deaf or have difficulty hearing?: No Does the patient have difficulty seeing, even when wearing glasses/contacts?: No Does the patient have difficulty concentrating, remembering, or making decisions?: No Patient able to express need for assistance with ADLs?: Yes Does the patient have difficulty dressing or bathing?: No Independently performs ADLs?: Yes (appropriate for developmental age) Does the patient have difficulty walking or climbing stairs?: No Weakness of Legs:  None Weakness of Arms/Hands: None  Permission Sought/Granted                  Emotional Assessment              Admission diagnosis:  Diverticulitis [K57.92] Patient Active Problem List   Diagnosis Date Noted   Coronary atherosclerosis 02/07/2022   Aortic atherosclerosis ([place]) 02/07/2022   Diverticulitis 02/06/2022   Diarrhea 02/06/2022   Cocaine use 02/06/2022   Generalized joint pain 01/17/2022   Health care maintenance 01/17/2022   Encounter to establish care 12/11/2021   History of asthma 12/11/2021   History of COPD 12/11/2021   History of gastroesophageal reflux (GERD) 12/11/2021   Smoking 12/11/2021   PCP:  Langston Reusing, NP Pharmacy:   [facility] [place] [address] Phone: [phone] Fax: [phone]     Social Determinants of Health (SDOH) Social History: SDOH Screenings   Food Insecurity: No Food Insecurity (02/06/2022)  Housing: Low Risk  (02/06/2022)  Transportation Needs: No Transportation Needs (02/06/2022)  Utilities: Not At Risk (02/06/2022)  Alcohol Screen: Low Risk  (12/11/2021)  Depression (PHQ2-9): Medium Risk (12/11/2021)  Tobacco Use: High Risk (02/06/2022)   SDOH Interventions:     Readmission Risk Interventions  No data to display

## 2022-02-07 NOTE — Progress Notes (Signed)
PROGRESS NOTE    Stacy Gilmore   WUJ:[id number] DOB: July 09, 1960  DOA: 02/06/2022 Date of Service: 02/07/22 PCP: Rolm Gala, NP     Brief Narrative / Hospital Course:  Ms. Stacy Gilmore is a 62 year old female with history of GERD, asthma, COPD, who [facility] emergency department 02/06/22 for chief concerns of abdominal pain, constipation, nausea, dizziness. Of note, UA and UTI Tx 01/04 after she had not started abx for UTI in 11/23, and on 01/04 saw PCP and culture had to be canceled d/t specimen not refrigerated, no symptoms so was not treated  01/24: in ED, VSS, serum WBC 13.5, CMP mildly elevated AST/ALT, UA trace leuks (+)nitrite, 21-50 WBC, CT abd/pelv w contrast (+)diverticulitis w/o abscess/perforation, Tylenol 650 mg p.o. one-time dose, morphine 4 mg IV, ondansetron 4 mg IV, ciprofloxacin 400 mg IV, metronidazole 500 mg IV. Admitted to hospitalist service.  01/25: added UCx to previously collected specimen. Still on IV fluids and CLD. No BM, started bowel regimen. PT reports pain is about same as yesterday       Consultants:  none  Procedures: none      ASSESSMENT & PLAN:   Principal Problem:   Diverticulitis Active Problems:   History of asthma   History of COPD   History of gastroesophageal reflux (GERD)   Diarrhea   Cocaine use   Diverticulitis w/o abscess/perforation  LR 125 mL/h, 1 day ordered Continue with metronidazole and ciprofloxacin Symptomatic support: itching w/ po oxycodone, reduced fentanyl, added toradol  encourage non-opiate pain control if at all possible  UTI Add urine culture to specimen collected  Abx as above - cipro should cover common pathogens   History of asthma History of COPD Does not appear to be in acute exacerbation at this time Albuterol nebulizer every 6 hours as needed for wheezing and shortness of breath, 4 days ordered  Chronic Diarrhea Acute constipation Abdominal pain  d/t diverticulitis w/o abscess/perforation  Patient reports she has never had a colonoscopy May need outpatient GI follow up or consult inpatient if worse / not improving   Cocaine use Patient self medicate with cocaine whenever she feels pain in her body Counseled patient on cocaine cessation and that she needs to present to the emergency department and for her outpatient doctor to be evaluated for pain as this is an indication that something may be off. Patient endorses understanding and compliance    DVT prophylaxis: heparin Pertinent IV fluids/nutrition: LR as above, CLD advance as tolerated  Central lines / invasive devices: none  Code Status: FULL CODE   Current Admission Status: observation, will likely need inpatient if continued need for IV therapies  TOC needs / Dispo plan: no needs at this time, anticipate d/c back home  Barriers to discharge / significant pending items: requiring IV therapies             Subjective / Brief ROS:  Patient reports persistent waxing/waning abd pain/ cramping  Denies CP/SOB.  Denies new weakness.  Tolerating diet CLD.  Reports no concerns w/ urination/defecation.   Family Communication: none at this time     Objective Findings:  Vitals:   02/06/22 1826 02/06/22 1937 02/07/22 0329 02/07/22 0755  BP: 109/74 100/67 105/66 101/64  Pulse: 77 75 81 74  Resp: 16 16 16 18   Temp: 97.9 F (36.6 C) 98 F (36.7 C) 98.8 F (37.1 C) 98 F (36.7 C)  TempSrc: Oral Oral Oral Oral  SpO2: 94% 94%  96% 94%  Weight:      Height:        Intake/Output Summary (Last 24 hours) at 02/07/2022 1454 Last data filed at 02/07/2022 1126 Gross per 24 hour  Intake 2003.66 ml  Output 0 ml  Net 2003.66 ml   Filed Weights   02/06/22 1136  Weight: 70.8 kg    Examination:  Physical Exam Constitutional:      General: She is not in acute distress.    Appearance: She is obese.  Cardiovascular:     Rate and Rhythm: Normal rate and regular  rhythm.  Pulmonary:     Effort: Pulmonary effort is normal.     Breath sounds: Normal breath sounds.  Abdominal:     General: Bowel sounds are increased.     Palpations: Abdomen is soft.     Tenderness: There is generalized abdominal tenderness.  Skin:    General: Skin is warm and dry.  Neurological:     General: No focal deficit present.     Mental Status: She is alert and oriented to person, place, and time.  Psychiatric:        Mood and Affect: Mood is anxious.        Behavior: Behavior normal.          Scheduled Medications:   fluticasone furoate-vilanterol  1 puff Inhalation Daily   heparin  5,000 Units Subcutaneous Q8H   ketorolac  30 mg Intravenous Q8H   pantoprazole  40 mg Oral Daily   pneumococcal 20-valent conjugate vaccine  0.5 mL Intramuscular Tomorrow-1000   polyethylene glycol  17 g Oral BID   senna-docusate  2 tablet Oral BID    Continuous Infusions:  ciprofloxacin Stopped (02/07/22 0534)   lactated ringers Stopped (02/07/22 0559)   metronidazole 100 mL/hr at 02/07/22 0600    PRN Medications:  acetaminophen **OR** acetaminophen, albuterol, diphenhydrAMINE, fentaNYL (SUBLIMAZE) injection, ondansetron **OR** ondansetron (ZOFRAN) IV  Antimicrobials from admission:  Anti-infectives (From admission, onward)    Start     Dose/Rate Route Frequency Ordered Stop   02/07/22 0600  metroNIDAZOLE (FLAGYL) IVPB 500 mg        500 mg 100 mL/hr over 60 Minutes Intravenous Every 12 hours 02/06/22 1735 02/13/22 0559   02/07/22 0500  ciprofloxacin (CIPRO) IVPB 400 mg        400 mg 200 mL/hr over 60 Minutes Intravenous Every 12 hours 02/06/22 1735 02/12/22 0459   02/06/22 1715  ciprofloxacin (CIPRO) IVPB 400 mg        400 mg 200 mL/hr over 60 Minutes Intravenous  Once 02/06/22 1713 02/06/22 1939   02/06/22 1715  metroNIDAZOLE (FLAGYL) IVPB 500 mg        500 mg 100 mL/hr over 60 Minutes Intravenous  Once 02/06/22 1713 02/06/22 1942           Data Reviewed:   I have personally reviewed the following...  CBC: Recent Labs  Lab 02/06/22 1145 02/07/22 0532  WBC 13.5* 7.4  HGB 13.5 10.8*  HCT 41.6 32.9*  MCV 85.1 85.0  PLT 258 762   Basic Metabolic Panel: Recent Labs  Lab 02/06/22 1145 02/07/22 0532  NA 135 133*  K 3.9 3.2*  CL 98 97*  CO2 26 24  GLUCOSE 112* 114*  BUN 12 7  CREATININE 0.63 0.53  CALCIUM 9.3 8.4*   GFR: Estimated Creatinine Clearance: 71.4 mL/min (by C-G formula based on SCr of 0.53 mg/dL). Liver Function Tests: Recent Labs  Lab 02/06/22 1145  AST  45*  ALT 47*  ALKPHOS 67  BILITOT 0.9  PROT 7.4  ALBUMIN 3.6   Recent Labs  Lab 02/06/22 1145  LIPASE 47   No results for input(s): "AMMONIA" in the last 168 hours. Coagulation Profile: No results for input(s): "INR", "PROTIME" in the last 168 hours. Cardiac Enzymes: No results for input(s): "CKTOTAL", "CKMB", "CKMBINDEX", "TROPONINI" in the last 168 hours. BNP (last 3 results) No results for input(s): "PROBNP" in the last 8760 hours. HbA1C: No results for input(s): "HGBA1C" in the last 72 hours. CBG: No results for input(s): "GLUCAP" in the last 168 hours. Lipid Profile: No results for input(s): "CHOL", "HDL", "LDLCALC", "TRIG", "CHOLHDL", "LDLDIRECT" in the last 72 hours. Thyroid Function Tests: No results for input(s): "TSH", "T4TOTAL", "FREET4", "T3FREE", "THYROIDAB" in the last 72 hours. Anemia Panel: No results for input(s): "VITAMINB12", "FOLATE", "FERRITIN", "TIBC", "IRON", "RETICCTPCT" in the last 72 hours. Most Recent Urinalysis On File:     Component Value Date/Time   COLORURINE AMBER (A) 02/06/2022 1145   APPEARANCEUR HAZY (A) 02/06/2022 1145   APPEARANCEUR Cloudy (A) 01/17/2022 1348   LABSPEC 1.018 02/06/2022 1145   LABSPEC 1.009 04/16/2013 0735   PHURINE 5.0 02/06/2022 1145   GLUCOSEU NEGATIVE 02/06/2022 1145   GLUCOSEU Negative 04/16/2013 0735   HGBUR NEGATIVE 02/06/2022 1145   BILIRUBINUR NEGATIVE 02/06/2022 1145    BILIRUBINUR Negative 01/17/2022 1348   BILIRUBINUR Negative 04/16/2013 0735   KETONESUR NEGATIVE 02/06/2022 1145   PROTEINUR 30 (A) 02/06/2022 1145   NITRITE POSITIVE (A) 02/06/2022 1145   LEUKOCYTESUR TRACE (A) 02/06/2022 1145   LEUKOCYTESUR 2+ 04/16/2013 0735   Sepsis Labs: @LABRCNTIP (procalcitonin:4,lacticidven:4) Microbiology: No results found for this or any previous visit (from the past 240 hour(s)).    Radiology Studies last 3 days: CT ABDOMEN PELVIS W CONTRAST  Result Date: 02/06/2022 CLINICAL DATA:  Acute abdominal pain, lack of bowel movement over the last 4 days. EXAM: CT ABDOMEN AND PELVIS WITH CONTRAST TECHNIQUE: Multidetector CT imaging of the abdomen and pelvis was performed using the standard protocol following bolus administration of intravenous contrast. RADIATION DOSE REDUCTION: This exam was performed according to the departmental dose-optimization program which includes automated exposure control, adjustment of the mA and/or kV according to patient size and/or use of iterative reconstruction technique. CONTRAST:  02/08/2022 OMNIPAQUE IOHEXOL 300 MG/ML  SOLN COMPARISON:  05/03/2013 FINDINGS: Lower chest: Left anterior descending, right, and circumflex coronary artery atherosclerotic vascular calcifications. Hepatobiliary: Cholecystectomy. Mildly prominent common hepatic duct at 9 mm with reasonably normal caliber common bile duct, overall likely a physiologic response to cholecystectomy. No significant abnormal hepatic parenchymal lesion identified. Pancreas: Unremarkable Spleen: Unremarkable Adrenals/Urinary Tract: Fullness of both adrenal glands without discrete adrenal mass. The kidneys appear unremarkable. Stomach/Bowel: Abnormal wall thickening at the junction of the descending and sigmoid colon with inflamed anterior diverticulum shown on image 88 series 6, compatible with a moderate degree of acute diverticulitis. The wall thickening in this region extends over a 4.2 cm  segment. No extraluminal gas or abscess. Surrounding moderate mesenteric stranding along with a trace amount of fluid in the left paracolic gutter noted. In addition there is concurrent inflamed diverticulum of the distal sigmoid colon shown on image 69 of series 2, with mild to moderate surrounding local inflammatory findings but no discrete abscess. A tiny locally contained micro perforation is not totally excluded given the punctate gas densities along the anterior margin of the diverticulum, but more likely these are contained within the diverticulum. Vascular/Lymphatic: Mild aortoiliac atherosclerotic vascular calcification. Reproductive:  Unremarkable Other: No supplemental non-categorized findings. Musculoskeletal: Small umbilical hernia contains adipose tissue. IMPRESSION: 1. Acute diverticulitis at the junction of the descending and sigmoid colon, with moderate surrounding inflammatory findings but no discrete abscess or extraluminal gas. 2. Additional separate focus of acute diverticulitis of the distal sigmoid colon, with mild to moderate surrounding inflammatory findings but no discrete abscess. I favor that the small amount of gas along the inflamed diverticulum is contained within the diverticulum rather than this representing a contained micro perforation. 3. Coronary atherosclerosis. 4. Small umbilical hernia contains adipose tissue. 5. Mildly prominent common hepatic duct at 9 mm, overall normal caliber common bile duct, overall likely a physiologic response to cholecystectomy. Aortic Atherosclerosis (ICD10-I70.0). Electronically Signed   By: Van Clines M.D.   On: 02/06/2022 15:57             LOS: 0 days      Emeterio Reeve, DO [facility] 02/07/2022, 2:54 PM    Dictation software may have been used to generate the above note. Typos may occur and escape review in typed/dictated notes. Please contact Dr Sheppard Coil directly for clarity if needed.  Staff may  message me via secure chat in [place]  but this may not receive an immediate response,  please page me for urgent matters!  If 7PM-7AM, please contact night coverage [email or web address]

## 2022-02-08 ENCOUNTER — Other Ambulatory Visit: Payer: Self-pay

## 2022-02-08 LAB — BASIC METABOLIC PANEL
Anion gap: 6 (ref 5–15)
BUN: 7 mg/dL (ref 6–20)
CO2: 31 mmol/L (ref 22–32)
Calcium: 8.5 mg/dL — ABNORMAL LOW (ref 8.9–10.3)
Chloride: 100 mmol/L (ref 98–111)
Creatinine, Ser: 0.65 mg/dL (ref 0.44–1.00)
GFR, Estimated: >60 mL/min (ref >60)
Glucose, Bld: 113 mg/dL — ABNORMAL HIGH (ref 70–99)
Potassium: 3.4 mmol/L — ABNORMAL LOW (ref 3.5–5.1)
Sodium: 137 mmol/L (ref 135–145)

## 2022-02-08 LAB — MAGNESIUM: Magnesium: 1.7 mg/dL (ref 1.7–2.4)

## 2022-02-08 MED ORDER — METRONIDAZOLE 500 MG PO TABS
500.0000 mg | ORAL_TABLET | Freq: Three times a day (TID) | ORAL | 0 refills | Status: DC
  Filled 2022-02-08: qty 21, 7d supply, fill #0

## 2022-02-08 MED ORDER — CIPROFLOXACIN HCL 500 MG PO TABS
500.0000 mg | ORAL_TABLET | Freq: Two times a day (BID) | ORAL | 0 refills | Status: DC
  Filled 2022-02-08: qty 14, 7d supply, fill #0

## 2022-02-08 NOTE — Progress Notes (Signed)
PROGRESS NOTE    Stacy Gilmore   ZOX:[id number] DOB: Dec 07, 1960  DOA: 02/06/2022 Date of Service: 02/08/22 PCP: Langston Reusing, NP     Brief Narrative / Hospital Course:  Stacy Gilmore is a 62 year old female with history of GERD, asthma, COPD, who [facility] to [facility] emergency department 02/06/22 for chief concerns of abdominal pain, constipation, nausea, dizziness. Of note, UA and UTI Tx 01/04 after she had not started abx for UTI in 11/23, and on 01/04 saw PCP and culture had to be canceled d/t specimen not refrigerated, no symptoms so was not treated  01/24: in ED, VSS, serum WBC 13.5, CMP mildly elevated AST/ALT, UA trace leuks (+)nitrite, 21-50 WBC, CT abd/pelv w contrast (+)diverticulitis w/o abscess/perforation, Tylenol 650 mg p.o. one-time dose, morphine 4 mg IV, ondansetron 4 mg IV, ciprofloxacin 400 mg IV, metronidazole 500 mg IV. Admitted to hospitalist service.  01/25: added UCx to previously collected specimen. Still on IV fluids and CLD. No BM, started bowel regimen. She reports pain is about same as yesterday 01/26: UCx (+)Ecoli pending susceptibilities. Pain stable, cramping. BM this afternoon, watery, still abdominal pain.        Consultants:  none  Procedures: none      ASSESSMENT & PLAN:   Principal Problem:   Diverticulitis Active Problems:   History of asthma   History of COPD   History of gastroesophageal reflux (GERD)   Diarrhea   Cocaine use   Diverticulitis w/o abscess/perforation  LR 125 mL/h, 1 day ordered --> tolerating clear diet  Continue with metronidazole and ciprofloxacin Symptomatic support: itching w/ po oxycodone, reduced fentanyl, added toradol  encourage non-opiate pain control if at all possible  UTI UCx pending suscept but (+)Ecoli, anticipate results tomorrow  Abx as above - cipro should cover common pathogens   History of asthma History of COPD Does not appear to be in acute  exacerbation at this time Albuterol nebulizer every 6 hours as needed for wheezing and shortness of breath, 4 days ordered  Chronic Diarrhea Acute constipation Abdominal pain d/t diverticulitis w/o abscess/perforation  Patient reports she has never had a colonoscopy May need outpatient GI follow up or consult inpatient if worse / not improving  Bowel regimen here   Cocaine use Patient self medicate with cocaine whenever she feels pain in her body Counseled patient on cocaine cessation and that she needs to present to the emergency department and for her outpatient doctor to be evaluated for pain as this is an indication that something may be off. Patient endorses understanding and compliance    DVT prophylaxis: heparin Pertinent IV fluids/nutrition: LR as above, CLD advance as tolerated  Central lines / invasive devices: none  Code Status: FULL CODE   Current Admission Status: observation, will likely need inpatient if continued need for IV therapies  TOC needs / Dispo plan: no needs at this time, anticipate d/c back home  Barriers to discharge / significant pending items: requiring IV therapies as above, significant abdominal pain              Subjective / Brief ROS:  Patient reports persistent waxing/waning abd pain/ cramping  Denies CP/SOB.  Denies new weakness.  Tolerating diet CLD.  Had BM this afternoon and was watery, still significant abd pain   Family Communication: none at this time     Objective Findings:  Vitals:   02/07/22 0755 02/07/22 1939 02/08/22 0413 02/08/22 0931  BP: 101/64 101/73 103/66 120/76  Pulse: 74 69 65 64  Resp: 18 20 16 18   Temp: 98 F (36.7 C) 97.8 F (36.6 C) 97.7 F (36.5 C) 97.7 F (36.5 C)  TempSrc: Oral Oral Oral Oral  SpO2: 94% 95% 94% 95%  Weight:      Height:        Intake/Output Summary (Last 24 hours) at 02/08/2022 1557 Last data filed at 02/08/2022 1503 Gross per 24 hour  Intake 1316.31 ml  Output 0 ml  Net  1316.31 ml   Filed Weights   02/06/22 1136  Weight: 70.8 kg    Examination:  Physical Exam Constitutional:      General: She is not in acute distress.    Appearance: She is obese.  Cardiovascular:     Rate and Rhythm: Normal rate and regular rhythm.  Pulmonary:     Effort: Pulmonary effort is normal.     Breath sounds: Normal breath sounds.  Abdominal:     General: Bowel sounds are increased.     Palpations: Abdomen is soft.     Tenderness: There is generalized abdominal tenderness.  Skin:    General: Skin is warm and dry.  Neurological:     General: No focal deficit present.     Mental Status: She is alert and oriented to person, place, and time.  Psychiatric:        Mood and Affect: Mood is anxious.        Behavior: Behavior normal.          Scheduled Medications:   fluticasone furoate-vilanterol  1 puff Inhalation Daily   heparin  5,000 Units Subcutaneous Q8H   ketorolac  30 mg Intravenous Q8H   pantoprazole  40 mg Oral Daily   pneumococcal 20-valent conjugate vaccine  0.5 mL Intramuscular Tomorrow-1000    Continuous Infusions:  ciprofloxacin 200 mL/hr at 02/08/22 1503   metronidazole Stopped (02/08/22 0716)    PRN Medications:  acetaminophen **OR** acetaminophen, albuterol, diphenhydrAMINE, ondansetron **OR** ondansetron (ZOFRAN) IV  Antimicrobials from admission:  Anti-infectives (From admission, onward)    Start     Dose/Rate Route Frequency Ordered Stop   02/08/22 0000  ciprofloxacin (CIPRO) 500 MG tablet        500 mg Oral 2 times daily 02/08/22 1535 02/15/22 2359   02/08/22 0000  metroNIDAZOLE (FLAGYL) 500 MG tablet        500 mg Oral 3 times daily 02/08/22 1535 02/15/22 2359   02/07/22 0600  metroNIDAZOLE (FLAGYL) IVPB 500 mg        500 mg 100 mL/hr over 60 Minutes Intravenous Every 12 hours 02/06/22 1735 02/13/22 0559   02/07/22 0500  ciprofloxacin (CIPRO) IVPB 400 mg        400 mg 200 mL/hr over 60 Minutes Intravenous Every 12 hours  02/06/22 1735 02/12/22 0459   02/06/22 1715  ciprofloxacin (CIPRO) IVPB 400 mg        400 mg 200 mL/hr over 60 Minutes Intravenous  Once 02/06/22 1713 02/06/22 1939   02/06/22 1715  metroNIDAZOLE (FLAGYL) IVPB 500 mg        500 mg 100 mL/hr over 60 Minutes Intravenous  Once 02/06/22 1713 02/06/22 1942           Data Reviewed:  I have personally reviewed the following...  CBC: Recent Labs  Lab 02/06/22 1145 02/07/22 0532  WBC 13.5* 7.4  HGB 13.5 10.8*  HCT 41.6 32.9*  MCV 85.1 85.0  PLT 258 500   Basic Metabolic Panel: Recent Labs  Lab 02/06/22 1145 02/07/22 0532 02/08/22 0439  NA 135 133* 137  K 3.9 3.2* 3.4*  CL 98 97* 100  CO2 26 24 31   GLUCOSE 112* 114* 113*  BUN 12 7 7   CREATININE 0.63 0.53 0.65  CALCIUM 9.3 8.4* 8.5*  MG  --   --  1.7   GFR: Estimated Creatinine Clearance: 71.4 mL/min (by C-G formula based on SCr of 0.65 mg/dL). Liver Function Tests: Recent Labs  Lab 02/06/22 1145  AST 45*  ALT 47*  ALKPHOS 67  BILITOT 0.9  PROT 7.4  ALBUMIN 3.6   Recent Labs  Lab 02/06/22 1145  LIPASE 47   No results for input(s): "AMMONIA" in the last 168 hours. Coagulation Profile: No results for input(s): "INR", "PROTIME" in the last 168 hours. Cardiac Enzymes: No results for input(s): "CKTOTAL", "CKMB", "CKMBINDEX", "TROPONINI" in the last 168 hours. BNP (last 3 results) No results for input(s): "PROBNP" in the last 8760 hours. HbA1C: No results for input(s): "HGBA1C" in the last 72 hours. CBG: No results for input(s): "GLUCAP" in the last 168 hours. Lipid Profile: No results for input(s): "CHOL", "HDL", "LDLCALC", "TRIG", "CHOLHDL", "LDLDIRECT" in the last 72 hours. Thyroid Function Tests: No results for input(s): "TSH", "T4TOTAL", "FREET4", "T3FREE", "THYROIDAB" in the last 72 hours. Anemia Panel: No results for input(s): "VITAMINB12", "FOLATE", "FERRITIN", "TIBC", "IRON", "RETICCTPCT" in the last 72 hours. Most Recent Urinalysis On File:      Component Value Date/Time   COLORURINE AMBER (A) 02/06/2022 1145   APPEARANCEUR HAZY (A) 02/06/2022 1145   APPEARANCEUR Cloudy (A) 01/17/2022 1348   LABSPEC 1.018 02/06/2022 1145   LABSPEC 1.009 04/16/2013 0735   PHURINE 5.0 02/06/2022 1145   GLUCOSEU NEGATIVE 02/06/2022 1145   GLUCOSEU Negative 04/16/2013 0735   HGBUR NEGATIVE 02/06/2022 1145   BILIRUBINUR NEGATIVE 02/06/2022 1145   BILIRUBINUR Negative 01/17/2022 1348   BILIRUBINUR Negative 04/16/2013 0735   KETONESUR NEGATIVE 02/06/2022 1145   PROTEINUR 30 (A) 02/06/2022 1145   NITRITE POSITIVE (A) 02/06/2022 1145   LEUKOCYTESUR TRACE (A) 02/06/2022 1145   LEUKOCYTESUR 2+ 04/16/2013 0735   Sepsis Labs: @LABRCNTIP (procalcitonin:4,lacticidven:4) Microbiology: Recent Results (from the past 240 hour(s))  Urine Culture (for pregnant, neutropenic or urologic patients or patients with an indwelling urinary catheter)     Status: Abnormal (Preliminary result)   Collection Time: 02/06/22 11:45 AM   Specimen: Urine, Clean Catch  Result Value Ref Range Status   Specimen Description   Final    URINE, CLEAN CATCH Performed at [facility], [address] [place], [facility] [address]    Special Requests   Final    Normal Performed at [facility], [address], [place], [facility] [address]    Culture (A)  Final    >=100,000 COLONIES/mL ESCHERICHIA COLI SUSCEPTIBILITIES TO FOLLOW Performed at [facility], [address] [place], [facility] [address]    Report Status PENDING  Incomplete      Radiology Studies last 3 days: CT ABDOMEN PELVIS W CONTRAST  Result Date: 02/06/2022 CLINICAL DATA:  Acute abdominal pain, lack of bowel movement over the last 4 days. EXAM: CT ABDOMEN AND PELVIS WITH CONTRAST TECHNIQUE: Multidetector CT imaging of the abdomen and pelvis was performed using the standard protocol following bolus administration of intravenous contrast. RADIATION DOSE REDUCTION: This exam was performed  according to the departmental dose-optimization program which includes automated exposure control, adjustment of the mA and/or kV according to patient size and/or use of iterative reconstruction technique. CONTRAST:  OMNIPAQUE IOHEXOL 300 MG/ML  SOLN COMPARISON:  05/03/2013 FINDINGS: Lower chest: Left anterior descending, right, and circumflex coronary artery atherosclerotic vascular calcifications. Hepatobiliary: Cholecystectomy. Mildly prominent common hepatic duct at 9 mm with reasonably normal caliber common bile duct, overall likely a physiologic response to cholecystectomy. No significant abnormal hepatic parenchymal lesion identified. Pancreas: Unremarkable Spleen: Unremarkable Adrenals/Urinary Tract: Fullness of both adrenal glands without discrete adrenal mass. The kidneys appear unremarkable. Stomach/Bowel: Abnormal wall thickening at the junction of the descending and sigmoid colon with inflamed anterior diverticulum shown on image 88 series 6, compatible with a moderate degree of acute diverticulitis. The wall thickening in this region extends over a 4.2 cm segment. No extraluminal gas or abscess. Surrounding moderate mesenteric stranding along with a trace amount of fluid in the left paracolic gutter noted. In addition there is concurrent inflamed diverticulum of the distal sigmoid colon shown on image 69 of series 2, with mild to moderate surrounding local inflammatory findings but no discrete abscess. A tiny locally contained micro perforation is not totally excluded given the punctate gas densities along the anterior margin of the diverticulum, but more likely these are contained within the diverticulum. Vascular/Lymphatic: Mild aortoiliac atherosclerotic vascular calcification. Reproductive: Unremarkable Other: No supplemental non-categorized findings. Musculoskeletal: Small umbilical hernia contains adipose tissue. IMPRESSION: 1. Acute diverticulitis at the junction of the descending and  sigmoid colon, with moderate surrounding inflammatory findings but no discrete abscess or extraluminal gas. 2. Additional separate focus of acute diverticulitis of the distal sigmoid colon, with mild to moderate surrounding inflammatory findings but no discrete abscess. I favor that the small amount of gas along the inflamed diverticulum is contained within the diverticulum rather than this representing a contained micro perforation. 3. Coronary atherosclerosis. 4. Small umbilical hernia contains adipose tissue. 5. Mildly prominent common hepatic duct at 9 mm, overall normal caliber common bile duct, overall likely a physiologic response to cholecystectomy. Aortic Atherosclerosis (ICD10-I70.0). Electronically Signed   By: Gaylyn Rong M.D.   On: 02/06/2022 15:57             LOS: 1 day      Sunnie Nielsen, DO [facility] 02/08/2022, 3:57 PM    Dictation software may have been used to generate the above note. Typos may occur and escape review in typed/dictated notes. Please contact Dr Lyn Hollingshead directly for clarity if needed.  Staff may message me via secure chat in Epic  but this may not receive an immediate response,  please page me for urgent matters!  If 7PM-7AM, please contact night coverage [email or web address]

## 2022-02-09 LAB — URINE CULTURE
Culture: >=100000 — AB
Special Requests: NORMAL

## 2022-02-09 LAB — BASIC METABOLIC PANEL
Anion gap: 9 (ref 5–15)
BUN: <5 mg/dL — ABNORMAL LOW (ref 6–20)
CO2: 28 mmol/L (ref 22–32)
Calcium: 8.9 mg/dL (ref 8.9–10.3)
Chloride: 105 mmol/L (ref 98–111)
Creatinine, Ser: 0.62 mg/dL (ref 0.44–1.00)
GFR, Estimated: >60 mL/min (ref >60)
Glucose, Bld: 113 mg/dL — ABNORMAL HIGH (ref 70–99)
Potassium: 4.1 mmol/L (ref 3.5–5.1)
Sodium: 142 mmol/L (ref 135–145)

## 2022-02-09 NOTE — Progress Notes (Signed)
PROGRESS NOTE    LIA VIGILANTE   UYQ:[id number] DOB: Dec 10, 1960  DOA: 02/06/2022 Date of Service: 02/09/22 PCP: Langston Reusing, NP     Brief Narrative / Hospital Course:  Ms. Annisha Baar is a 62 year old female with history of GERD, asthma, COPD, who [facility] to [facility] emergency department 02/06/22 for chief concerns of abdominal pain, constipation, nausea, dizziness. Of note, UA and UTI Tx 01/04 after she had not started abx for UTI in 11/23, and on 01/04 saw PCP and culture had to be canceled d/t specimen not refrigerated, no symptoms so was not treated  01/24: in ED, VSS, serum WBC 13.5, CMP mildly elevated AST/ALT, UA trace leuks (+)nitrite, 21-50 WBC, CT abd/pelv w contrast (+)diverticulitis w/o abscess/perforation, Tylenol 650 mg p.o. one-time dose, morphine 4 mg IV, ondansetron 4 mg IV, ciprofloxacin 400 mg IV, metronidazole 500 mg IV. Admitted to hospitalist service.  01/25: added UCx to previously collected specimen. Still on IV fluids and CLD. No BM, started bowel regimen. She reports pain is about same as yesterday 01/26: UCx (+)Ecoli pending susceptibilities. Pain stable, cramping. BM this afternoon, watery, still abdominal pain.  01/27: UCx pansensitive Ecoli. Remains on IV abx, nauseous but improving, wants to try advancing diet, I agreed to full liquids w/ caveat that this might worsen pain. Hopefully pain/nausea will improve by tomorrow and po intake will improve and can d/c IVF w/ goal for discharge later tomorrow. Constipation improving, liquid BM yesterday a bit more formed today       Consultants:  none  Procedures: none      ASSESSMENT & PLAN:   Principal Problem:   Diverticulitis Active Problems:   History of asthma   History of COPD   History of gastroesophageal reflux (GERD)   Diarrhea   Cocaine use   Diverticulitis w/o abscess/perforation  LR 125 mL/h, 1 day ordered --> tolerating clear diet --> pt want to try  full liquids, still not taking much po and still nauseous  Continue with metronidazole and ciprofloxacin Symptomatic support: have d/c opiates, can reorder toradol as needed encourage non-opiate pain control if at all possible  UTI UCx pansensitive Ecoli. Abx as above - cipro should cover common pathogens   History of asthma History of COPD Does not appear to be in acute exacerbation at this time Albuterol nebulizer every 6 hours as needed for wheezing and shortness of breath, 4 days ordered  Chronic Diarrhea Acute constipation Abdominal pain d/t diverticulitis w/o abscess/perforation  Patient reports she has never had a colonoscopy May need outpatient GI follow up or consult inpatient if worse / not improving  Bowel regimen here   Cocaine use Patient self medicate with cocaine whenever she feels pain in her body Counseled patient on cocaine cessation and that she needs to present to the emergency department and for her outpatient doctor to be evaluated for pain as this is an indication that something may be off. Patient endorses understanding and compliance    DVT prophylaxis: heparin Pertinent IV fluids/nutrition: LR as above, CLD advance as tolerated  Central lines / invasive devices: none  Code Status: FULL CODE   Current Admission Status: observation, will likely need inpatient if continued need for IV therapies  TOC needs / Dispo plan: no needs at this time, anticipate d/c back home  Barriers to discharge / significant pending items: requiring IV therapies as above, significant abdominal pain              Subjective /  Brief ROS:  Patient reports persistent waxing/waning abd pain/ cramping but better Still nausea Denies CP/SOB.  Denies new weakness.  Tolerating diet CLD. Requests advance   Family Communication: none at this time     Objective Findings:  Vitals:   02/08/22 1615 02/08/22 1923 02/09/22 0450 02/09/22 0853  BP: 108/67 (!) 95/56 130/81  129/82  Pulse: 63 67 60 61  Resp: 18 16 16 16   Temp: 97.7 F (36.5 C) 97.7 F (36.5 C) 97.8 F (36.6 C) 97.8 F (36.6 C)  TempSrc: Oral Oral Oral   SpO2: 98% 97% 100% 99%  Weight:      Height:        Intake/Output Summary (Last 24 hours) at 02/09/2022 1242 Last data filed at 02/09/2022 02/11/2022 Gross per 24 hour  Intake 1196.31 ml  Output 0 ml  Net 1196.31 ml   Filed Weights   02/06/22 1136  Weight: 70.8 kg    Examination:  Physical Exam Constitutional:      General: She is not in acute distress.    Appearance: She is obese.  Cardiovascular:     Rate and Rhythm: Normal rate and regular rhythm.  Pulmonary:     Effort: Pulmonary effort is normal.     Breath sounds: Normal breath sounds.  Abdominal:     General: Bowel sounds are increased.     Palpations: Abdomen is soft.     Tenderness: There is generalized abdominal tenderness.  Skin:    General: Skin is warm and dry.  Neurological:     General: No focal deficit present.     Mental Status: She is alert and oriented to person, place, and time.  Psychiatric:        Mood and Affect: Mood is anxious.        Behavior: Behavior normal.          Scheduled Medications:   fluticasone furoate-vilanterol  1 puff Inhalation Daily   heparin  5,000 Units Subcutaneous Q8H   pantoprazole  40 mg Oral Daily   pneumococcal 20-valent conjugate vaccine  0.5 mL Intramuscular Tomorrow-1000    Continuous Infusions:  ciprofloxacin 400 mg (02/09/22 0603)   metronidazole 500 mg (02/09/22 0721)    PRN Medications:  acetaminophen **OR** acetaminophen, albuterol, diphenhydrAMINE, ondansetron **OR** ondansetron (ZOFRAN) IV  Antimicrobials from admission:  Anti-infectives (From admission, onward)    Start     Dose/Rate Route Frequency Ordered Stop   02/08/22 0000  ciprofloxacin (CIPRO) 500 MG tablet        500 mg Oral 2 times daily 02/08/22 1535 02/15/22 2359   02/08/22 0000  metroNIDAZOLE (FLAGYL) 500 MG tablet        500 mg  Oral 3 times daily 02/08/22 1535 02/15/22 2359   02/07/22 0600  metroNIDAZOLE (FLAGYL) IVPB 500 mg        500 mg 100 mL/hr over 60 Minutes Intravenous Every 12 hours 02/06/22 1735 02/13/22 0559   02/07/22 0500  ciprofloxacin (CIPRO) IVPB 400 mg        400 mg 200 mL/hr over 60 Minutes Intravenous Every 12 hours 02/06/22 1735 02/12/22 0459   02/06/22 1715  ciprofloxacin (CIPRO) IVPB 400 mg        400 mg 200 mL/hr over 60 Minutes Intravenous  Once 02/06/22 1713 02/06/22 1939   02/06/22 1715  metroNIDAZOLE (FLAGYL) IVPB 500 mg        500 mg 100 mL/hr over 60 Minutes Intravenous  Once 02/06/22 1713 02/06/22 1942  Data Reviewed:  I have personally reviewed the following...  CBC: Recent Labs  Lab 02/06/22 1145 02/07/22 0532  WBC 13.5* 7.4  HGB 13.5 10.8*  HCT 41.6 32.9*  MCV 85.1 85.0  PLT 258 096   Basic Metabolic Panel: Recent Labs  Lab 02/06/22 1145 02/07/22 0532 02/08/22 0439 02/09/22 0422  NA 135 133* 137 142  K 3.9 3.2* 3.4* 4.1  CL 98 97* 100 105  CO2 26 24 31 28   GLUCOSE 112* 114* 113* 113*  BUN 12 7 7  <5*  CREATININE 0.63 0.53 0.65 0.62  CALCIUM 9.3 8.4* 8.5* 8.9  MG  --   --  1.7  --    GFR: Estimated Creatinine Clearance: 71.4 mL/min (by C-G formula based on SCr of 0.62 mg/dL). Liver Function Tests: Recent Labs  Lab 02/06/22 1145  AST 45*  ALT 47*  ALKPHOS 67  BILITOT 0.9  PROT 7.4  ALBUMIN 3.6   Recent Labs  Lab 02/06/22 1145  LIPASE 47   No results for input(s): "AMMONIA" in the last 168 hours. Coagulation Profile: No results for input(s): "INR", "PROTIME" in the last 168 hours. Cardiac Enzymes: No results for input(s): "CKTOTAL", "CKMB", "CKMBINDEX", "TROPONINI" in the last 168 hours. BNP (last 3 results) No results for input(s): "PROBNP" in the last 8760 hours. HbA1C: No results for input(s): "HGBA1C" in the last 72 hours. CBG: No results for input(s): "GLUCAP" in the last 168 hours. Lipid Profile: No results for  input(s): "CHOL", "HDL", "LDLCALC", "TRIG", "CHOLHDL", "LDLDIRECT" in the last 72 hours. Thyroid Function Tests: No results for input(s): "TSH", "T4TOTAL", "FREET4", "T3FREE", "THYROIDAB" in the last 72 hours. Anemia Panel: No results for input(s): "VITAMINB12", "FOLATE", "FERRITIN", "TIBC", "IRON", "RETICCTPCT" in the last 72 hours. Most Recent Urinalysis On File:     Component Value Date/Time   COLORURINE AMBER (A) 02/06/2022 1145   APPEARANCEUR HAZY (A) 02/06/2022 1145   APPEARANCEUR Cloudy (A) 01/17/2022 1348   LABSPEC 1.018 02/06/2022 1145   LABSPEC 1.009 04/16/2013 0735   PHURINE 5.0 02/06/2022 1145   GLUCOSEU NEGATIVE 02/06/2022 1145   GLUCOSEU Negative 04/16/2013 0735   HGBUR NEGATIVE 02/06/2022 1145   BILIRUBINUR NEGATIVE 02/06/2022 1145   BILIRUBINUR Negative 01/17/2022 1348   BILIRUBINUR Negative 04/16/2013 0735   KETONESUR NEGATIVE 02/06/2022 1145   PROTEINUR 30 (A) 02/06/2022 1145   NITRITE POSITIVE (A) 02/06/2022 1145   LEUKOCYTESUR TRACE (A) 02/06/2022 1145   LEUKOCYTESUR 2+ 04/16/2013 0735   Sepsis Labs: @LABRCNTIP (procalcitonin:4,lacticidven:4) Microbiology: Recent Results (from the past 240 hour(s))  Urine Culture (for pregnant, neutropenic or urologic patients or patients with an indwelling urinary catheter)     Status: Abnormal   Collection Time: 02/06/22 11:45 AM   Specimen: Urine, Clean Catch  Result Value Ref Range Status   Specimen Description   Final    URINE, CLEAN CATCH Performed at [facility], [address], [place], [place] [address]    Special Requests   Final    Normal Performed at [facility], [address] [place], [place] [address]    Culture >=100,000 COLONIES/mL ESCHERICHIA COLI (A)  Final   Report Status 02/09/2022 FINAL  Final   Organism ID, Bacteria ESCHERICHIA COLI (A)  Final      Susceptibility   Escherichia coli - MIC*    AMPICILLIN 4 SENSITIVE Sensitive     CEFAZOLIN <=4 SENSITIVE Sensitive      CEFEPIME <=0.12 SENSITIVE Sensitive     CEFTRIAXONE <=0.25 SENSITIVE Sensitive     CIPROFLOXACIN <=  0.25 SENSITIVE Sensitive     GENTAMICIN <=1 SENSITIVE Sensitive     IMIPENEM <=0.25 SENSITIVE Sensitive     NITROFURANTOIN <=16 SENSITIVE Sensitive     TRIMETH/SULFA <=20 SENSITIVE Sensitive     AMPICILLIN/SULBACTAM <=2 SENSITIVE Sensitive     PIP/TAZO <=4 SENSITIVE Sensitive     * >=100,000 COLONIES/mL ESCHERICHIA COLI      Radiology Studies last 3 days: CT ABDOMEN PELVIS W CONTRAST  Result Date: 02/06/2022 CLINICAL DATA:  Acute abdominal pain, lack of bowel movement over the last 4 days. EXAM: CT ABDOMEN AND PELVIS WITH CONTRAST TECHNIQUE: Multidetector CT imaging of the abdomen and pelvis was performed using the standard protocol following bolus administration of intravenous contrast. RADIATION DOSE REDUCTION: This exam was performed according to the departmental dose-optimization program which includes automated exposure control, adjustment of the mA and/or kV according to patient size and/or use of iterative reconstruction technique. CONTRAST:  OMNIPAQUE IOHEXOL 300 MG/ML  SOLN COMPARISON:  05/03/2013 FINDINGS: Lower chest: Left anterior descending, right, and circumflex coronary artery atherosclerotic vascular calcifications. Hepatobiliary: Cholecystectomy. Mildly prominent common hepatic duct at 9 mm with reasonably normal caliber common bile duct, overall likely a physiologic response to cholecystectomy. No significant abnormal hepatic parenchymal lesion identified. Pancreas: Unremarkable Spleen: Unremarkable Adrenals/Urinary Tract: Fullness of both adrenal glands without discrete adrenal mass. The kidneys appear unremarkable. Stomach/Bowel: Abnormal wall thickening at the junction of the descending and sigmoid colon with inflamed anterior diverticulum shown on image 88 series 6, compatible with a moderate degree of acute diverticulitis. The wall thickening in this region extends over a  4.2 cm segment. No extraluminal gas or abscess. Surrounding moderate mesenteric stranding along with a trace amount of fluid in the left paracolic gutter noted. In addition there is concurrent inflamed diverticulum of the distal sigmoid colon shown on image 69 of series 2, with mild to moderate surrounding local inflammatory findings but no discrete abscess. A tiny locally contained micro perforation is not totally excluded given the punctate gas densities along the anterior margin of the diverticulum, but more likely these are contained within the diverticulum. Vascular/Lymphatic: Mild aortoiliac atherosclerotic vascular calcification. Reproductive: Unremarkable Other: No supplemental non-categorized findings. Musculoskeletal: Small umbilical hernia contains adipose tissue. IMPRESSION: 1. Acute diverticulitis at the junction of the descending and sigmoid colon, with moderate surrounding inflammatory findings but no discrete abscess or extraluminal gas. 2. Additional separate focus of acute diverticulitis of the distal sigmoid colon, with mild to moderate surrounding inflammatory findings but no discrete abscess. I favor that the small amount of gas along the inflamed diverticulum is contained within the diverticulum rather than this representing a contained micro perforation. 3. Coronary atherosclerosis. 4. Small umbilical hernia contains adipose tissue. 5. Mildly prominent common hepatic duct at 9 mm, overall normal caliber common bile duct, overall likely a physiologic response to cholecystectomy. Aortic Atherosclerosis (ICD10-I70.0). Electronically Signed   By: Gaylyn Rong M.D.   On: 02/06/2022 15:57             LOS: 2 days      Sunnie Nielsen, DO [facility] 02/09/2022, 12:42 PM    Dictation software may have been used to generate the above note. Typos may occur and escape review in typed/dictated notes. Please contact Dr Lyn Hollingshead directly for clarity if needed.  Staff  may message me via secure chat in Epic  but this may not receive an immediate response,  please page me for urgent matters!  If 7PM-7AM, please contact night coverage [email or web address]

## 2022-02-10 NOTE — TOC Transition Note (Addendum)
Transition of Care [facility]) - CM/SW Discharge Note   Patient Details  Name: Stacy Gilmore MRN: [id number] Date of Birth: May 16, 1960  [facility]) CM/SW Contact:  Harriet Masson, RN Phone Number:[phone] 02/10/2022, 1:08 PM   Clinical Narrative:    Requested taxi voucher. RN spoke with pt at bedside on other family members (none available). Provided a taxi voucher spoke with Coralyn Mark Ladona Mow) for pick up today to transfer to home residence. Currently awaiting a call to confirm pick up time and will alert the bedside nurse of the arrival time.   TOC remains available to assist with any other needs. 2:20pm [employer]  [place]) confirmed in route for pick up on this pt for transportation. Floor staff aware to present pt to the [facility] for pick up for taxi agency.   Final next level of care: Home/Self Care Barriers to Discharge: Barriers Resolved   Patient Goals and CMS Choice      Discharge Placement                    Name of family member notified: Anasofia Micallef (pt aware). Unsuccessful attempt call to siginificant other Patient and family notified of of transfer: 02/10/22  Discharge Plan and Services Additional resources added to the After Visit Summary for                                       Social Determinants of Health (SDOH) Interventions SDOH Screenings   Food Insecurity: No Food Insecurity (02/06/2022)  Housing: Low Risk  (02/06/2022)  Transportation Needs: No Transportation Needs (02/06/2022)  Utilities: Not At Risk (02/06/2022)  Alcohol Screen: Low Risk  (12/11/2021)  Depression (PHQ2-9): Medium Risk (12/11/2021)  Tobacco Use: High Risk (02/06/2022)     Readmission Risk Interventions     No data to display

## 2022-02-10 NOTE — Plan of Care (Signed)
Patient AOX4, VSS throughout shift.  Pt c/o pain relieved by PRN tylenol and nausea relieved by PRN zofran.  All meds given on time as ordered.  Diminished lungs, IS encouraged.  Pt had 2 Bms this shift.  POC maintained, will continue to monitor.  Problem: Education: Goal: Knowledge of General Education information will improve Description: Including pain rating scale, medication(s)/side effects and non-pharmacologic comfort measures Outcome: Progressing   Problem: Health Behavior/Discharge Planning: Goal: Ability to manage health-related needs will improve Outcome: Progressing   Problem: Clinical Measurements: Goal: Ability to maintain clinical measurements within normal limits will improve Outcome: Progressing Goal: Will remain free from infection Outcome: Progressing Goal: Diagnostic test results will improve Outcome: Progressing Goal: Respiratory complications will improve Outcome: Progressing Goal: Cardiovascular complication will be avoided Outcome: Progressing   Problem: Activity: Goal: Risk for activity intolerance will decrease Outcome: Progressing   Problem: Nutrition: Goal: Adequate nutrition will be maintained Outcome: Progressing   Problem: Coping: Goal: Level of anxiety will decrease Outcome: Progressing   Problem: Elimination: Goal: Will not experience complications related to bowel motility Outcome: Progressing Goal: Will not experience complications related to urinary retention Outcome: Progressing   Problem: Pain Managment: Goal: General experience of comfort will improve Outcome: Progressing   Problem: Safety: Goal: Ability to remain free from injury will improve Outcome: Progressing   Problem: Skin Integrity: Goal: Risk for impaired skin integrity will decrease Outcome: Progressing

## 2022-02-10 NOTE — Discharge Summary (Signed)
Physician Discharge Summary   Patient: Stacy Gilmore MRN: [id number]  DOB: [birth year]   Admit:     Date of Admission: 02/06/2022 Admitted from: home   Discharge: Date of discharge: 02/10/22 Disposition: Home Condition at discharge: good  CODE STATUS: FULL CODE     Discharge Physician: Sunnie Nielsen, DO [facility]     PCP: Rolm Gala, NP  Recommendations for Outpatient Follow-up:  Follow up with PCP [place], Chioma E, NP in 1-2 weeks Please obtain labs/tests: CBC, BMP in 1-2 weeks Counseled on slowly advance diet  Counseled on abstinence form recreational drugs, tobacco  Please follow up on the following pending results: none PCP AND OTHER OUTPATIENT PROVIDERS: SEE BELOW FOR SPECIFIC DISCHARGE INSTRUCTIONS PRINTED FOR PATIENT IN ADDITION TO GENERIC AVS PATIENT INFO     Discharge Instructions     Call MD for:  persistant nausea and vomiting   Complete by: As directed    Call MD for:  severe uncontrolled pain   Complete by: As directed    Call MD for:  temperature >100.4   Complete by: As directed    Diet - low sodium heart healthy   Complete by: As directed    Discharge instructions   Complete by: As directed    Advance diet slowly from full liquids to soft diet to regular diet Finish antibiotics   Increase activity slowly   Complete by: As directed          Discharge Diagnoses: Principal Problem:   Diverticulitis Active Problems:   History of asthma   History of COPD   History of gastroesophageal reflux (GERD)   Diarrhea   Cocaine use   Coronary atherosclerosis   Aortic atherosclerosis [facility])       Hospital Course: Stacy Gilmore is a 62 year old female with history of GERD, asthma, COPD, who [facility] department 02/06/22 for chief concerns of abdominal pain, constipation, nausea, dizziness. Of note, UA and UTI Tx 01/04 after she had not started abx for UTI in 11/23, and  on 01/04 saw PCP and culture had to be canceled d/t specimen not refrigerated, no symptoms so was not treated  01/24: in ED, VSS, serum WBC 13.5, CMP mildly elevated AST/ALT, UA trace leuks (+)nitrite, 21-50 WBC, CT abd/pelv w contrast (+)diverticulitis w/o abscess/perforation, Tylenol 650 mg p.o. one-time dose, morphine 4 mg IV, ondansetron 4 mg IV, ciprofloxacin 400 mg IV, metronidazole 500 mg IV. Admitted to hospitalist service.  01/25: added UCx to previously collected specimen. Still on IV fluids and CLD. No BM, started bowel regimen. She reports pain is about same as yesterday 01/26: UCx (+)Ecoli pending susceptibilities. Pain stable, cramping. BM this afternoon, watery, still abdominal pain.  01/27: UCx pansensitive Ecoli. Remains on IV abx, nauseous but improving, wants to try advancing diet, I agreed to full liquids w/ caveat that this might worsen pain. Hopefully pain/nausea will improve by tomorrow and po intake will improve and can d/c IVF w/ goal for discharge later tomorrow. Constipation improving, liquid BM yesterday a bit more formed today 01/28: pain much better, ambulating, requesting solid food. Instructed on slowly advancing diet, appropriate for d/c home           Consultants:  none   Procedures: none           ASSESSMENT & PLAN:     Diverticulitis w/o abscess/perforation  LR 125 mL/h, 1 day ordered --> tolerating clear diet --> pt want to  try full liquids, still not taking much po and still nauseous --> nausea resolved and pain improved  Continue with metronidazole and ciprofloxacin po    UTI UCx pansensitive Ecoli. Abx as above - cipro should cover common pathogens    History of asthma History of COPD Does not appear to be in acute exacerbation at this time   Chronic Diarrhea Acute constipation Abdominal pain d/t diverticulitis w/o abscess/perforation  Patient reports she has never had a colonoscopy May need outpatient GI follow up or consult inpatient  if worse / not improving  Bowel regimen here successful, moving bowels normally at time of discharge    Cocaine use Patient self medicate with cocaine whenever she feels pain in her body Counseled patient on cocaine cessation and that she needs to present to the emergency department and for her outpatient doctor to be evaluated for pain as this is an indication that something may be off. Patient endorses understanding and compliance            Discharge Instructions  Allergies as of 02/10/2022       Reactions   Bee Venom Swelling   Penicillins Hives, Swelling   Armoracia Rusticana Ext (horseradish) Rash   Blisters the mouth   Other Rash   Ragu spaghetti sauce        Medication List     TAKE these medications    albuterol 108 (90 Base) MCG/ACT inhaler Commonly known as: VENTOLIN HFA Inhale 1-2 puffs into the lungs every 6 (six) hours as needed for wheezing or shortness of breath.   Breo Ellipta 100-25 MCG/ACT Aepb Generic drug: fluticasone furoate-vilanterol Inhale one puff into the lungs daily What changed: Another medication with the same name was removed. Continue taking this medication, and follow the directions you see here.   ciprofloxacin 500 MG tablet Commonly known as: Cipro Take 1 tablet (500 mg total) by mouth 2 (two) times daily for 7 days.   metroNIDAZOLE 500 MG tablet Commonly known as: FLAGYL Take 1 tablet (500 mg total) by mouth 3 (three) times daily for 7 days.   naproxen 500 MG tablet Commonly known as: NAPROSYN Take 1 tablet (500 mg total) by mouth 2 (two) times daily with a meal.   omeprazole 20 MG capsule Commonly known as: PRILOSEC Take 1 capsule (20 mg total) by mouth daily.          Allergies  Allergen Reactions   Bee Venom Swelling   Penicillins Hives and Swelling   Armoracia Rusticana Ext (Horseradish) Rash    Blisters the mouth   Other Rash    Ragu spaghetti sauce     Subjective: pt reports pain is better, no  nausea, requesting solid diet    Discharge Exam: BP 129/81 (BP Location: Right Arm)   Pulse (!) 56   Temp 97.6 F (36.4 C) (Oral)   Resp 16   Ht 5' 6.5" (1.689 m)   Wt 70.8 kg   SpO2 97%   BMI 24.80 kg/m  General: Pt is alert, awake, not in acute distress Cardiovascular: RRR, S1/S2 +, no rubs, no gallops Respiratory: CTA bilaterally, no wheezing, no rhonchi Abdominal: Soft, TTP LLQ, ND, bowel sounds + Extremities: no edema, no cyanosis     The results of significant diagnostics from this hospitalization (including imaging, microbiology, ancillary and laboratory) are listed below for reference.     Microbiology: Recent Results (from the past 240 hour(s))  Urine Culture (for pregnant, neutropenic or urologic patients or patients with an  indwelling urinary catheter)     Status: Abnormal   Collection Time: 02/06/22 11:45 AM   Specimen: Urine, Clean Catch  Result Value Ref Range Status   Specimen Description   Final    URINE, CLEAN CATCH Performed at [facility], [address], [place], [place] [address]    Special Requests   Final    Normal Performed at [facility], [place], [place] [address]    Culture >=100,000 COLONIES/mL ESCHERICHIA COLI (A)  Final   Report Status 02/09/2022 FINAL  Final   Organism ID, Bacteria ESCHERICHIA COLI (A)  Final      Susceptibility   Escherichia coli - MIC*    AMPICILLIN 4 SENSITIVE Sensitive     CEFAZOLIN <=4 SENSITIVE Sensitive     CEFEPIME <=0.12 SENSITIVE Sensitive     CEFTRIAXONE <=0.25 SENSITIVE Sensitive     CIPROFLOXACIN <=0.25 SENSITIVE Sensitive     GENTAMICIN <=1 SENSITIVE Sensitive     IMIPENEM <=0.25 SENSITIVE Sensitive     NITROFURANTOIN <=16 SENSITIVE Sensitive     TRIMETH/SULFA <=20 SENSITIVE Sensitive     AMPICILLIN/SULBACTAM <=2 SENSITIVE Sensitive     PIP/TAZO <=4 SENSITIVE Sensitive     * >=100,000 COLONIES/mL ESCHERICHIA COLI     Labs: BNP (last 3 results) No results for  input(s): "BNP" in the last 8760 hours. Basic Metabolic Panel: Recent Labs  Lab 02/06/22 1145 02/07/22 0532 02/08/22 0439 02/09/22 0422  NA 135 133* 137 142  K 3.9 3.2* 3.4* 4.1  CL 98 97* 100 105  CO2 26 24 31 28   GLUCOSE 112* 114* 113* 113*  BUN 12 7 7  <5*  CREATININE 0.63 0.53 0.65 0.62  CALCIUM 9.3 8.4* 8.5* 8.9  MG  --   --  1.7  --    Liver Function Tests: Recent Labs  Lab 02/06/22 1145  AST 45*  ALT 47*  ALKPHOS 67  BILITOT 0.9  PROT 7.4  ALBUMIN 3.6   Recent Labs  Lab 02/06/22 1145  LIPASE 47   No results for input(s): "AMMONIA" in the last 168 hours. CBC: Recent Labs  Lab 02/06/22 1145 02/07/22 0532  WBC 13.5* 7.4  HGB 13.5 10.8*  HCT 41.6 32.9*  MCV 85.1 85.0  PLT 258 198   Cardiac Enzymes: No results for input(s): "CKTOTAL", "CKMB", "CKMBINDEX", "TROPONINI" in the last 168 hours. BNP: Invalid input(s): "POCBNP" CBG: No results for input(s): "GLUCAP" in the last 168 hours. D-Dimer No results for input(s): "DDIMER" in the last 72 hours. Hgb A1c No results for input(s): "HGBA1C" in the last 72 hours. Lipid Profile No results for input(s): "CHOL", "HDL", "LDLCALC", "TRIG", "CHOLHDL", "LDLDIRECT" in the last 72 hours. Thyroid function studies No results for input(s): "TSH", "T4TOTAL", "T3FREE", "THYROIDAB" in the last 72 hours.  Invalid input(s): "FREET3" Anemia work up No results for input(s): "VITAMINB12", "FOLATE", "FERRITIN", "TIBC", "IRON", "RETICCTPCT" in the last 72 hours. Urinalysis    Component Value Date/Time   COLORURINE AMBER (A) 02/06/2022 1145   APPEARANCEUR HAZY (A) 02/06/2022 1145   APPEARANCEUR Cloudy (A) 01/17/2022 1348   LABSPEC 1.018 02/06/2022 1145   LABSPEC 1.009 04/16/2013 0735   PHURINE 5.0 02/06/2022 1145   GLUCOSEU NEGATIVE 02/06/2022 1145   GLUCOSEU Negative 04/16/2013 0735   HGBUR NEGATIVE 02/06/2022 1145   BILIRUBINUR NEGATIVE 02/06/2022 1145   BILIRUBINUR Negative 01/17/2022 1348   BILIRUBINUR Negative  04/16/2013 0735   KETONESUR NEGATIVE 02/06/2022 1145   PROTEINUR 30 (A) 02/06/2022 1145   NITRITE POSITIVE (  A) 02/06/2022 1145   LEUKOCYTESUR TRACE (A) 02/06/2022 1145   LEUKOCYTESUR 2+ 04/16/2013 0735   Sepsis Labs Recent Labs  Lab 02/06/22 1145 02/07/22 0532  WBC 13.5* 7.4   Microbiology Recent Results (from the past 240 hour(s))  Urine Culture (for pregnant, neutropenic or urologic patients or patients with an indwelling urinary catheter)     Status: Abnormal   Collection Time: 02/06/22 11:45 AM   Specimen: Urine, Clean Catch  Result Value Ref Range Status   Specimen Description   Final    URINE, CLEAN CATCH Performed at [facility], [address], [place], [place] [address]    Special Requests   Final    Normal Performed at [facility], [address] [place], [place] [address]    Culture >=100,000 COLONIES/mL ESCHERICHIA COLI (A)  Final   Report Status 02/09/2022 FINAL  Final   Organism ID, Bacteria ESCHERICHIA COLI (A)  Final      Susceptibility   Escherichia coli - MIC*    AMPICILLIN 4 SENSITIVE Sensitive     CEFAZOLIN <=4 SENSITIVE Sensitive     CEFEPIME <=0.12 SENSITIVE Sensitive     CEFTRIAXONE <=0.25 SENSITIVE Sensitive     CIPROFLOXACIN <=0.25 SENSITIVE Sensitive     GENTAMICIN <=1 SENSITIVE Sensitive     IMIPENEM <=0.25 SENSITIVE Sensitive     NITROFURANTOIN <=16 SENSITIVE Sensitive     TRIMETH/SULFA <=20 SENSITIVE Sensitive     AMPICILLIN/SULBACTAM <=2 SENSITIVE Sensitive     PIP/TAZO <=4 SENSITIVE Sensitive     * >=100,000 COLONIES/mL ESCHERICHIA COLI   Imaging CT ABDOMEN PELVIS W CONTRAST  Result Date: 02/06/2022 CLINICAL DATA:  Acute abdominal pain, lack of bowel movement over the last 4 days. EXAM: CT ABDOMEN AND PELVIS WITH CONTRAST TECHNIQUE: Multidetector CT imaging of the abdomen and pelvis was performed using the standard protocol following bolus administration of intravenous contrast. RADIATION DOSE REDUCTION: This exam  was performed according to the departmental dose-optimization program which includes automated exposure control, adjustment of the mA and/or kV according to patient size and/or use of iterative reconstruction technique. CONTRAST:  138mL OMNIPAQUE IOHEXOL 300 MG/ML  SOLN COMPARISON:  05/03/2013 FINDINGS: Lower chest: Left anterior descending, right, and circumflex coronary artery atherosclerotic vascular calcifications. Hepatobiliary: Cholecystectomy. Mildly prominent common hepatic duct at 9 mm with reasonably normal caliber common bile duct, overall likely a physiologic response to cholecystectomy. No significant abnormal hepatic parenchymal lesion identified. Pancreas: Unremarkable Spleen: Unremarkable Adrenals/Urinary Tract: Fullness of both adrenal glands without discrete adrenal mass. The kidneys appear unremarkable. Stomach/Bowel: Abnormal wall thickening at the junction of the descending and sigmoid colon with inflamed anterior diverticulum shown on image 88 series 6, compatible with a moderate degree of acute diverticulitis. The wall thickening in this region extends over a 4.2 cm segment. No extraluminal gas or abscess. Surrounding moderate mesenteric stranding along with a trace amount of fluid in the left paracolic gutter noted. In addition there is concurrent inflamed diverticulum of the distal sigmoid colon shown on image 69 of series 2, with mild to moderate surrounding local inflammatory findings but no discrete abscess. A tiny locally contained micro perforation is not totally excluded given the punctate gas densities along the anterior margin of the diverticulum, but more likely these are contained within the diverticulum. Vascular/Lymphatic: Mild aortoiliac atherosclerotic vascular calcification. Reproductive: Unremarkable Other: No supplemental non-categorized findings. Musculoskeletal: Small umbilical hernia contains adipose tissue. IMPRESSION: 1. Acute diverticulitis at the junction of the  descending and sigmoid colon, with moderate surrounding inflammatory  findings but no discrete abscess or extraluminal gas. 2. Additional separate focus of acute diverticulitis of the distal sigmoid colon, with mild to moderate surrounding inflammatory findings but no discrete abscess. I favor that the small amount of gas along the inflamed diverticulum is contained within the diverticulum rather than this representing a contained micro perforation. 3. Coronary atherosclerosis. 4. Small umbilical hernia contains adipose tissue. 5. Mildly prominent common hepatic duct at 9 mm, overall normal caliber common bile duct, overall likely a physiologic response to cholecystectomy. Aortic Atherosclerosis (ICD10-I70.0). Electronically Signed   By: Gaylyn Rong M.D.   On: 02/06/2022 15:57      Time coordinating discharge: over 30 minutes  SIGNED:  Sunnie Nielsen DO [facility]

## 2022-02-10 NOTE — Progress Notes (Signed)
Nursing Discharge Note   Admit Date: 02/06/2022  Discharge date:    Emer Onnen to be discharged home per MD order.  AVS completed. Reviewed with patient at bedside. Highlighted copy provided for patient to take home.  Patient able to verbalize understanding of discharge instructions. PIV removed. Patient stable upon discharge. Patient received prescriptions medications from pharmacy and CM/SW contacted for taxi voucher due to not having a ride home.   Discharge Instructions     Call MD for:  persistant nausea and vomiting   Complete by: As directed    Call MD for:  severe uncontrolled pain   Complete by: As directed    Call MD for:  temperature >100.4   Complete by: As directed    Diet - low sodium heart healthy   Complete by: As directed    Discharge instructions   Complete by: As directed    Advance diet slowly from full liquids to soft diet to regular diet Finish antibiotics   Increase activity slowly   Complete by: As directed        Allergies as of 02/10/2022       Reactions   Bee Venom Swelling   Penicillins Hives, Swelling   Armoracia Rusticana Ext (horseradish) Rash   Blisters the mouth   Other Rash   Ragu spaghetti sauce        Medication List     TAKE these medications    albuterol 108 (90 Base) MCG/ACT inhaler Commonly known as: VENTOLIN HFA Inhale 1-2 puffs into the lungs every 6 (six) hours as needed for wheezing or shortness of breath.   Breo Ellipta 100-25 MCG/ACT Aepb Generic drug: fluticasone furoate-vilanterol Inhale one puff into the lungs daily What changed: Another medication with the same name was removed. Continue taking this medication, and follow the directions you see here.   ciprofloxacin 500 MG tablet Commonly known as: Cipro Take 1 tablet (500 mg total) by mouth 2 (two) times daily for 7 days.   metroNIDAZOLE 500 MG tablet Commonly known as: FLAGYL Take 1 tablet (500 mg total) by mouth 3 (three) times daily for 7 days.    naproxen 500 MG tablet Commonly known as: NAPROSYN Take 1 tablet (500 mg total) by mouth 2 (two) times daily with a meal.   omeprazole 20 MG capsule Commonly known as: PRILOSEC Take 1 capsule (20 mg total) by mouth daily.         Discharge Instructions/ Education: Discharge instructions given to patient/ with verbalized understanding. Discharge education completed with patient including: follow up instructions, medication list, discharge activities, and limitations if indicated. Patient able to verbalize understanding, all questions fully answered. Patient instructed to return to Emergency Department, call 911, or call MD for any changes in condition.  Patient escorted via wheelchair to lobby and discharged home via taxi.

## 2022-02-12 ENCOUNTER — Other Ambulatory Visit: Payer: Self-pay

## 2022-02-14 ENCOUNTER — Other Ambulatory Visit: Payer: Self-pay

## 2022-02-14 ENCOUNTER — Encounter: Payer: Self-pay | Admitting: Gerontology

## 2022-02-14 ENCOUNTER — Ambulatory Visit: Payer: Self-pay | Admitting: Adult Health

## 2022-02-14 VITALS — BP 146/81 | HR 70 | Temp 97.8°F | Resp 18 | Ht 66.5 in | Wt 158.3 lb

## 2022-02-14 DIAGNOSIS — K529 Noninfective gastroenteritis and colitis, unspecified: Secondary | ICD-10-CM

## 2022-02-14 DIAGNOSIS — Z8709 Personal history of other diseases of the respiratory system: Secondary | ICD-10-CM

## 2022-02-14 DIAGNOSIS — J410 Simple chronic bronchitis: Secondary | ICD-10-CM

## 2022-02-14 DIAGNOSIS — Z8719 Personal history of other diseases of the digestive system: Secondary | ICD-10-CM

## 2022-02-14 DIAGNOSIS — M2550 Pain in unspecified joint: Secondary | ICD-10-CM

## 2022-02-14 DIAGNOSIS — M159 Polyosteoarthritis, unspecified: Secondary | ICD-10-CM

## 2022-02-14 DIAGNOSIS — K219 Gastro-esophageal reflux disease without esophagitis: Secondary | ICD-10-CM

## 2022-02-14 DIAGNOSIS — F17200 Nicotine dependence, unspecified, uncomplicated: Secondary | ICD-10-CM

## 2022-02-14 DIAGNOSIS — E559 Vitamin D deficiency, unspecified: Secondary | ICD-10-CM

## 2022-02-14 MED ORDER — VITAMIN D (ERGOCALCIFEROL) 1.25 MG (50000 UNIT) PO CAPS
50000.0000 [IU] | ORAL_CAPSULE | ORAL | 3 refills | Status: DC
  Filled 2022-02-14: qty 4, 28d supply, fill #0

## 2022-02-14 MED ORDER — METRONIDAZOLE 500 MG PO TABS
500.0000 mg | ORAL_TABLET | Freq: Three times a day (TID) | ORAL | 0 refills | Status: AC
  Filled 2022-02-14: qty 9, 3d supply, fill #0

## 2022-02-14 MED ORDER — NAPROXEN 500 MG PO TABS
500.0000 mg | ORAL_TABLET | Freq: Two times a day (BID) | ORAL | 1 refills | Status: DC
  Filled 2022-02-14: qty 60, 30d supply, fill #0

## 2022-02-14 MED ORDER — SENNOSIDES-DOCUSATE SODIUM 8.6-50 MG PO TABS
1.0000 | ORAL_TABLET | Freq: Every evening | ORAL | 1 refills | Status: DC | PRN
  Filled 2022-02-14: qty 30, 30d supply, fill #0

## 2022-02-14 MED ORDER — CIPROFLOXACIN HCL 500 MG PO TABS
500.0000 mg | ORAL_TABLET | Freq: Two times a day (BID) | ORAL | 0 refills | Status: AC
  Filled 2022-02-14: qty 6, 3d supply, fill #0

## 2022-02-14 MED ORDER — ALBUTEROL SULFATE HFA 108 (90 BASE) MCG/ACT IN AERS
2.0000 | INHALATION_SPRAY | RESPIRATORY_TRACT | 5 refills | Status: DC | PRN
  Filled 2022-02-14: qty 8.5, 25d supply, fill #0
  Filled 2022-04-11: qty 8.5, 25d supply, fill #1
  Filled 2022-06-26: qty 18, 25d supply, fill #2
  Filled 2022-06-27: qty 18, 30d supply, fill #2
  Filled 2022-10-24: qty 6.7, 12d supply, fill #3

## 2022-02-14 MED ORDER — FLUTICASONE FUROATE-VILANTEROL 100-25 MCG/ACT IN AEPB
1.0000 | INHALATION_SPRAY | Freq: Every day | RESPIRATORY_TRACT | 3 refills | Status: DC
  Filled 2022-02-14 – 2022-04-11 (×2): qty 180, 180d supply, fill #0
  Filled 2022-06-27: qty 180, 90d supply, fill #1
  Filled 2022-10-24: qty 180, 90d supply, fill #2

## 2022-02-14 MED ORDER — POLYETHYLENE GLYCOL 3350 17 GM/SCOOP PO POWD
17.0000 g | Freq: Every day | ORAL | 1 refills | Status: DC
  Filled 2022-02-14: qty 255, 15d supply, fill #0

## 2022-02-14 MED ORDER — OMEPRAZOLE 20 MG PO CPDR
20.0000 mg | DELAYED_RELEASE_CAPSULE | Freq: Every day | ORAL | 3 refills | Status: DC
  Filled 2022-02-14: qty 90, 90d supply, fill #0
  Filled 2022-06-26: qty 90, 90d supply, fill #1

## 2022-02-14 NOTE — Progress Notes (Signed)
Patient: Stacy Gilmore Female    DOB: 23-Sep-1960   62 y.o.   MRN: [id number] Visit Date: 02/14/2022  Today's Provider: Deforest Hoyles, NP   Chief Complaint  Patient presents with   Hospitalization Follow-up    Patient admitted to [facility] on 02/06/22 for diverticulitis.   Subjective:    HPI: Stacy Gilmore is a 62 year old female who present for a follow up ER visit for diverticulitis. Patient stated she could not go to the bathroom because she was so backed up. She was treated for diverticulitis and also diagnosed with a bad UTI. Patient also complaining of Left quadrant pain  Denies any blood urine or stool. Her last diverticulitis flare up was many years ago. Patient states that she  needs follow for colonoscopy and evaluation for Crohn disease. Also needs a CT scan for lung cancer. Denies any blood in stool and no constipation at this time. She is on a soft diet now. Also added naproxen for arthritis pain. Needs refills for her medication. She was treated for colitis with metronidazole and ciprofloxacin. Completed the charity care application. She needs and follow up with GI. She has pain in her hands and knees. Has mild crepitation. She takes naproxen  Current daily smoker but she has cut back and needs a CT scan to rule out lung cancer. She takes Albuterol and Breo elipta.   Needs refills on her medication Omeprazole, Breo elipita and naproxen.  Started the ABT on Sunday also still has tenderness in the lower abdominal quadrant. She  Cipro 500mg  BID and Flagly 500 TID for 7 days will also extend for 3 days for a total of 10 days  Also take Miralax or 1 tab Senna at bedtime and also warm prune juice  If start diarrhea then skip som days. Important to aviod constipation  Smoking cessation- Smokes 7 cigarette a day. Will cut back by 1 each day  Needs a gastroenterologist visit.   Allergies  Allergen Reactions   Bee Venom Swelling   Penicillins Hives and Swelling   Armoracia  Rusticana Ext (Horseradish) Rash    Blisters the mouth   Other Rash    Ragu spaghetti sauce   Previous Medications   ALBUTEROL (VENTOLIN HFA) 108 (90 BASE) MCG/ACT INHALER    Inhale 1-2 puffs into the lungs every 6 (six) hours as needed for wheezing or shortness of breath.   CIPROFLOXACIN (CIPRO) 500 MG TABLET    Take 1 tablet (500 mg total) by mouth 2 (two) times daily for 7 days.   FLUTICASONE FUROATE-VILANTEROL (BREO ELLIPTA) 100-25 MCG/ACT AEPB    Inhale one puff into the lungs daily   METRONIDAZOLE (FLAGYL) 500 MG TABLET    Take 1 tablet (500 mg total) by mouth 3 (three) times daily for 7 days.   NAPROXEN (NAPROSYN) 500 MG TABLET    Take 1 tablet (500 mg total) by mouth 2 (two) times daily with a meal.   OMEPRAZOLE (PRILOSEC) 20 MG CAPSULE    Take 1 capsule (20 mg total) by mouth daily.    Review of Systems  Constitutional:  Negative for activity change, appetite change, chills, diaphoresis, fatigue, fever and unexpected weight change.  Respiratory:  Positive for wheezing.   Gastrointestinal:  Positive for abdominal pain (left lower quadrant) and constipation. Negative for abdominal distention, anal bleeding, blood in stool, diarrhea, nausea and rectal pain.  Genitourinary:  Negative for difficulty urinating, dysuria, flank pain, frequency, hematuria, pelvic pain and urgency.  Skin:  Negative  for color change, pallor, rash and wound.  Psychiatric/Behavioral:  Negative for agitation, behavioral problems, confusion and hallucinations.     Social History   Tobacco Use   Smoking status: Every Day    Packs/day: 0.50    Years: 40.00    Total pack years: 20.00    Types: Cigarettes   Smokeless tobacco: Never   Tobacco comments:    [place] [employer] Quit info given  Substance Use Topics   Alcohol use: Not Currently    Alcohol/week: 8.0 standard drinks of alcohol    Types: 8 Cans of beer per week    Comment: 1/2 of a 15 pack of beer weekly, last use ~ 02/04/22   Objective:   BP (!) 146/81 (BP  Location: Right Arm, Patient Position: Sitting, Cuff Size: Normal)   Pulse 70   Temp 97.8 F (36.6 C) (Oral)   Resp 18   Ht 5' 6.5" (1.689 m)   Wt 158 lb 4.8 oz (71.8 kg)   SpO2 96%   BMI 25.17 kg/m   Physical Exam Constitutional:      Appearance: Normal appearance.  HENT:     Head: Normocephalic.  Cardiovascular:     Rate and Rhythm: Normal rate and regular rhythm.     Pulses: Normal pulses.     Heart sounds: Normal heart sounds.  Pulmonary:     Effort: Pulmonary effort is normal.     Breath sounds: Normal breath sounds.  Abdominal:     General: Bowel sounds are normal.     Palpations: Abdomen is soft.     Tenderness: There is abdominal tenderness (left lower quadrant).  Skin:    General: Skin is warm and dry.  Neurological:     Mental Status: She is alert. Mental status is at baseline.  Psychiatric:        Mood and Affect: Mood normal.        Behavior: Behavior normal.        Thought Content: Thought content normal.        Judgment: Judgment normal.         Assessment & Plan:    1. Vitamin D deficiency, unspecified Patient prescribed Vitamin D 50000 units weekly- Will recheck Vitamin D level at next visit.   2. Smoking Discussed smoking smoking cessation with patient- patient has agreed to cut back from 7 cigarettes to 5 in the next 2 weeks.  3. Colitis  Patient complain of left lower abdominal quadrant pain- Currently on Flagyl and ciprofloxacin for 7 days, will add an additional 3 days for a total of 10 days. Discussed with the patient the importance of avoiding constipation - polyethylene glycol powder (MIRALAX) 17 GM/SCOOP powder; Take 17 g by mouth daily.  Dispense: 255 g; Refill: 1 - senna-docusate (SENOKOT-S) 8.6-50 MG tablet; Take 1 tablet by mouth at bedtime as needed for mild constipation.  Dispense: 30 tablet; Refill: 1  4. Primary osteoarthritis involving multiple joints  Continue taking NSAID as prescribed - naproxen (NAPROSYN) 500 MG tablet; Take  1 tablet (500 mg total) by mouth 2 (two) times daily with a meal.  Dispense: 60 tablet; Refill: 1  5. Simple chronic bronchitis (HCC) Continue current medications - fluticasone furoate-vilanterol (BREO ELLIPTA) 100-25 MCG/ACT AEPB; Inhale 1 puff into the lungs daily. Rinse and brush thoroughly after administration  Dispense: 180 each; Refill: 3  -albuterol (VENTOLIN HFA) 108 (90 Base) MCG/ACT inhaler; Inhale 2 puffs into the lungs every 4 (four) hours as needed for wheezing or shortness of  breath.  Dispense: 8.5 g; Refill: 5   6. Gastroesophageal reflux disease without esophagitis Continue current medication - omeprazole (PRILOSEC) 20 MG capsule; Take 1 capsule (20 mg total) by mouth daily.  Dispense: 90 capsule; Refill: [place], NP   [facility]

## 2022-02-14 NOTE — Patient Instructions (Signed)
Colitis  Colitis is a condition in which the colon is inflamed. It can cause diarrhea, blood in the stool, and abdominal pain. Colitis can last a short time (be acute), or it may last a long time (become chronic). What are the causes? This condition may be caused by: Infections from viruses or bacteria. A reaction to medicine. Certain autoimmune diseases, such as Crohn's disease or ulcerative colitis. Radiation treatment. Decreased blood flow to the bowel (ischemia). What are the signs or symptoms? Symptoms of this condition include: Diarrhea, blood in the stool, or black, tarry stool. Pain in the joints or abdominal pain. Fever or fatigue. Vomiting. Weight loss. Bloating. Having fewer bowel movements than usual. A strong and sudden urge to have a bowel movement. Feeling like the bowel is not empty after a bowel movement. How is this diagnosed? This condition may be diagnosed based on a stool test and a blood test. You may also have other tests, such as: X-rays. CT scan. Colonoscopy. Endoscopy. Biopsy. How is this treated? Treatment for this condition depends on the cause. This condition may be treated with: Steps to rest the bowel, such as not eating or drinking for a period of time. Fluids that are given through an IV. Medicine for pain and diarrhea. Antibiotic medicines. Cortisone medicines. Surgery. Follow these instructions at home: Eating and drinking  Follow instructions from your health care provider about eating or drinking restrictions. Drink enough fluid to keep your urine pale yellow. Work with a dietitian to determine whether certain foods cause your condition to flare up. Avoid foods or drinks that cause flare-ups. Eat a well-balanced diet. General instructions If you were prescribed an antibiotic medicine, take it as told by your health care provider. Do not stop taking the antibiotic even if you start to feel better. Take over-the-counter and prescription  medicines only as told by your health care provider. Keep all follow-up visits. This is important. Contact a health care provider if: Your symptoms do not go away. You develop new symptoms. Get help right away if: You have a fever that does not go away with treatment. You develop chills. You have extreme weakness, fainting, or dehydration. You vomit repeatedly. You develop severe pain in your abdomen. You pass bloody or tarry stool. Summary Colitis is a condition in which the colon is inflamed. Colitis can last a short time (be acute), or it may last a long time (become chronic). Treatment for this condition depends on the cause and may include resting the bowel, taking medicines, or having surgery. If you were prescribed an antibiotic medicine, take it as told by your health care provider. Do not stop taking the antibiotic even if you start to feel better. Get help right away if you develop severe pain in your abdomen. Keep all follow-up visits. This is important. This information is not intended to replace advice given to you by your health care provider. Make sure you discuss any questions you have with your health care provider. Document Revised: 09/07/2019 Document Reviewed: 09/07/2019 Elsevier Patient Education  [place].

## 2022-02-14 NOTE — Congregational Nurse Program (Signed)
  Dept: [phone]   Congregational Nurse Program Note  Date of Encounter: 02/14/2022 Client to [facility] Day center for bus passes as she has a follow up visit at the [facility]. This visit is a hospital follow up visit , she was admitted last week to [facility] with acute diverticulitis. She reports she was given all of her discharge medications prior to leaving the hospital from the [facility] pharmacy. No other needs at this time. Past Medical History: Past Medical History:  Diagnosis Date   Asthma    COPD (chronic obstructive pulmonary disease) ([place])    GERD (gastroesophageal reflux disease)    History of diverticulitis     Encounter Details:  CNP Questionnaire - 02/14/22 0843       Questionnaire   Ask client: Do you give verbal consent for me to treat you today? Yes    Student Assistance N/A    Location Patient [place]    Visit Setting with Client Organization    Patient Status Unknown   client currenlty lives in a rented house   Insurance Uninsured ([place] [employer] Connects/Self-Pay/Medicaid Family Planning)    Insurance/Financial Assistance Referral N/A    Medication N/A   medications provided through [facility] Provider Yes   Client is now a patient at [facility]   Screening Referrals [place] Appointment Made N/A    Recently w/o PCP, now 1st time PCP visit completed due to CNs referral or appointment made N/A   patient at [facility]   Food N/A   recently started receiving food stamps   Transportation Provided transportation assistance   Client given bus pass for [employer] bus for travel to [facility] apt today. [employer] bus now costs 1.00 each way   Housing/Utilities N/A    Interpersonal Safety N/A    Interventions Advocate/Support;Reviewed D/C Planning;Reviewed Medications    Abnormal to Normal Screening Since Last CN Visit N/A    Screenings CN Performed N/A    Sent  Client to Lab for: N/A    Did client attend any of the following based off CNs referral or appointments made? N/A    ED Visit Averted N/A   client was sent to [facility] ED d/t abdominal pain   Life-Saving Intervention Made N/A

## 2022-02-15 ENCOUNTER — Other Ambulatory Visit: Payer: Self-pay

## 2022-03-04 ENCOUNTER — Other Ambulatory Visit: Payer: Self-pay

## 2022-03-08 ENCOUNTER — Other Ambulatory Visit: Payer: Self-pay

## 2022-03-14 ENCOUNTER — Ambulatory Visit: Payer: Self-pay | Admitting: Gerontology

## 2022-03-19 ENCOUNTER — Encounter: Payer: Self-pay | Admitting: Gerontology

## 2022-03-19 ENCOUNTER — Ambulatory Visit: Payer: Self-pay | Admitting: Gerontology

## 2022-03-19 ENCOUNTER — Other Ambulatory Visit: Payer: Self-pay

## 2022-03-19 VITALS — BP 130/83 | HR 71 | Wt 153.6 lb

## 2022-03-19 DIAGNOSIS — M2550 Pain in unspecified joint: Secondary | ICD-10-CM

## 2022-03-19 DIAGNOSIS — K5792 Diverticulitis of intestine, part unspecified, without perforation or abscess without bleeding: Secondary | ICD-10-CM

## 2022-03-19 DIAGNOSIS — Z0000 Encounter for general adult medical examination without abnormal findings: Secondary | ICD-10-CM

## 2022-03-19 DIAGNOSIS — K219 Gastro-esophageal reflux disease without esophagitis: Secondary | ICD-10-CM

## 2022-03-19 MED ORDER — NAPROXEN 500 MG PO TABS
500.0000 mg | ORAL_TABLET | Freq: Two times a day (BID) | ORAL | 1 refills | Status: DC
  Filled 2022-03-19: qty 60, 30d supply, fill #0

## 2022-03-19 MED ORDER — POLYETHYLENE GLYCOL 3350 17 GM/SCOOP PO POWD
17.0000 g | Freq: Every day | ORAL | 1 refills | Status: DC
  Filled 2022-03-19: qty 255, 15d supply, fill #0

## 2022-03-19 MED ORDER — NAPROXEN 500 MG PO TABS
500.0000 mg | ORAL_TABLET | Freq: Two times a day (BID) | ORAL | 0 refills | Status: DC
  Filled 2022-03-19: qty 60, 30d supply, fill #0

## 2022-03-19 NOTE — Progress Notes (Signed)
Established Patient Office Visit  Subjective   Patient ID: Stacy Gilmore, female    DOB: [birth year]  Age: 62 y.o. MRN: [id number]  CC: Follow up visit.  HPI  Ms. Pareesa Bute is a 62 year old female with history of GERD, asthma, COPD, who presents for follow up visit. She is compliant with her medications, denies side effects and continues to make healthy lifestyle changes. During her last visit on 02/14/2022 for left lower quadrant abdominal pain and constipation. She was prescribed Flagyl and ciprofloxacin by mouth for colitis and Miralax and Senokot by mouth for constipation. She completed her antibiotics course. But, she didn't take Miralax and Senna-docusate because pharmacy didn't provide her these medications and she doesn't have money to buy over-the-counter medications. Currently, she continues to complain intermittent LLQ abdominal pain when she has constipation, pain rated 10/10.  She states she has alternated constipation and diarrhea. She denies fever, nausea, vomiting, poor appetite, and bloody stool. She requested for colonoscopy, no previous colonoscopy exam. She is smoker, three cigarettes a day. She denies using drugs anymore. Overall, she is doing good, no offer further complaints.    Review of Systems  Constitutional: Negative.   HENT: Negative.    Eyes: Negative.   Respiratory: Negative.    Cardiovascular: Negative.   Gastrointestinal:  Positive for abdominal pain and constipation.  Genitourinary: Negative.   Musculoskeletal: Negative.   Skin: Negative.       Objective:     There were no vitals taken for this visit. BP Readings from Last 3 Encounters:  03/19/22 130/83  02/14/22 (!) 146/81  02/10/22 129/81   Wt Readings from Last 3 Encounters:  03/19/22 153 lb 9.6 oz (69.7 kg)  02/14/22 158 lb 4.8 oz (71.8 kg)  02/06/22 156 lb (70.8 kg)      Physical Exam Constitutional:      Appearance: Normal appearance.  HENT:     Head: Normocephalic.   Cardiovascular:     Rate and Rhythm: Normal rate and regular rhythm.  Pulmonary:     Effort: Pulmonary effort is normal.     Breath sounds: Normal breath sounds.  Abdominal:     General: Abdomen is flat.     Palpations: Abdomen is soft.     Tenderness: There is abdominal tenderness in the left lower quadrant.  Musculoskeletal:        General: Normal range of motion.     Cervical back: Normal range of motion.  Neurological:     Mental Status: She is alert.     No results found for any visits on 03/19/22.  Last CBC Lab Results  Component Value Date   WBC 7.4 02/07/2022   HGB 10.8 (L) 02/07/2022   HCT 32.9 (L) 02/07/2022   MCV 85.0 02/07/2022   MCH 27.9 02/07/2022   RDW 12.7 02/07/2022   PLT 198 [id number]   Last metabolic panel Lab Results  Component Value Date   GLUCOSE 113 (H) 02/09/2022   NA 142 02/09/2022   K 4.1 02/09/2022   CL 105 02/09/2022   CO2 28 02/09/2022   BUN <5 (L) 02/09/2022   CREATININE 0.62 02/09/2022   GFRNONAA >60 02/09/2022   CALCIUM 8.9 02/09/2022   PROT 7.4 02/06/2022   ALBUMIN 3.6 02/06/2022   LABGLOB 2.5 12/11/2021   AGRATIO 1.7 12/11/2021   BILITOT 0.9 02/06/2022   ALKPHOS 67 02/06/2022   AST 45 (H) 02/06/2022   ALT 47 (H) 02/06/2022   ANIONGAP 9 02/09/2022   Last lipids Lab  Results  Component Value Date   CHOL 161 12/11/2021   HDL 42 12/11/2021   LDLCALC 93 12/11/2021   TRIG 145 12/11/2021   CHOLHDL 3.8 12/11/2021   Last hemoglobin A1c Lab Results  Component Value Date   HGBA1C 5.6 12/11/2021   Last thyroid functions No results found for: "TSH", "T3TOTAL", "T4TOTAL", "THYROIDAB" Last vitamin D Lab Results  Component Value Date   VD25OH 24.4 (L) 01/17/2022   Last vitamin B12 and Folate No results found for: "VITAMINB12", "FOLATE"    The 10-year ASCVD risk score (Arnett DK, et al., 2019) is: 9.6%    Assessment & Plan:   1. Diverticulitis - She continues to have constipation, was encouraged to buy otc Miralax,  increase dietary fiber, water intake and exercise as tolerated.  - CBC w/Diff - Comp Met (CMET) - polyethylene glycol powder (MIRALAX) 17 GM/SCOOP powder; Take 17 g by mouth daily.  Dispense: 255 g; Refill: 1 - Ambulatory referral to Gastroenterology  2. Gastroesophageal reflux disease without esophagitis - Her acid reflux is under control with taking Omeprazole and she will continue to take omeprazole (Prilosec) 20 mg capsule by mouth daily. -Avoid spicy, fatty and fried food -Avoid sodas and sour juices -Avoid heavy meals -Avoid eating 4 hours before bedtime -Elevate head of bed at night   3. Generalized joint pain - She states that her pain is improving with taking naproxen (NAPROSYN) 500 MG tablet; Take 1 tablet (500 mg total) by mouth 2 (two) times daily with a meal.  Dispense: 60 tablet; Refill: 0 - She will follow up with [facility] Dr Jefm Bryant if no relief.  4. Health care maintenance She has past medical history of drug abuse. - Urine drugs of abuse scrn w alc, routine ([facility], [place]); Future   Follow up in three months (06/19/2022) in clinic.     Leighton Roach, NP

## 2022-03-19 NOTE — Patient Instructions (Addendum)
Follow up in 3 monthsConstipation, Adult Constipation is when a person has trouble pooping (having a bowel movement). When you have this condition, you may poop fewer than 3 times a week. Your poop (stool) may also be dry, hard, or bigger than normal. Follow these instructions at home: Eating and drinking  Eat foods that have a lot of fiber, such as: Fresh fruits and vegetables. Whole grains. Beans. Eat less of foods that are low in fiber and high in fat and sugar, such as: [place] fries. Hamburgers. Cookies. Candy. Soda. Drink enough fluid to keep your pee (urine) pale yellow. General instructions Exercise regularly or as told by your doctor. Try to do 150 minutes of exercise each week. Go to the restroom when you feel like you need to poop. Do not hold it in. Take over-the-counter and prescription medicines only as told by your doctor. These include any fiber supplements. When you poop: Do deep breathing while relaxing your lower belly (abdomen). Relax your pelvic floor. The pelvic floor is a group of muscles that support the rectum, bladder, and intestines (as well as the uterus in women). Watch your condition for any changes. Tell your doctor if you notice any. Keep all follow-up visits as told by your doctor. This is important. Contact a doctor if: You have pain that gets worse. You have a fever. You have not pooped for 4 days. You vomit. You are not hungry. You lose weight. You are bleeding from the opening of the butt (anus). You have thin, pencil-like poop. Get help right away if: You have a fever, and your symptoms suddenly get worse. You leak poop or have blood in your poop. Your belly feels hard or bigger than normal (bloated). You have very bad belly pain. You feel dizzy or you faint. Summary Constipation is when a person poops fewer than 3 times a week, has trouble pooping, or has poop that is dry, hard, or bigger than normal. Eat foods that have a lot of  fiber. Drink enough fluid to keep your pee (urine) pale yellow. Take over-the-counter and prescription medicines only as told by your doctor. These include any fiber supplements. This information is not intended to replace advice given to you by your health care provider. Make sure you discuss any questions you have with your health care provider. Document Revised: 11/14/2021 Document Reviewed: 11/14/2021 Elsevier Patient Education  [place].

## 2022-03-20 ENCOUNTER — Other Ambulatory Visit: Payer: Self-pay

## 2022-03-21 ENCOUNTER — Other Ambulatory Visit: Payer: Self-pay

## 2022-03-22 LAB — URINE DRUGS OF ABUSE SCREEN W ALC, ROUTINE (REF LAB)
Amphetamines, Urine: NEGATIVE ng/mL
Barbiturate Quant, Ur: NEGATIVE ng/mL
Benzodiazepine Quant, Ur: NEGATIVE ng/mL
Cannabinoid Quant, Ur: NEGATIVE ng/mL
Cocaine (Metab.): NEGATIVE ng/mL
Ethanol, Urine: NEGATIVE %
Methadone Screen, Urine: NEGATIVE ng/mL
Opiate Quant, Ur: NEGATIVE ng/mL
PCP Quant, Ur: NEGATIVE ng/mL
Propoxyphene: NEGATIVE ng/mL

## 2022-04-11 ENCOUNTER — Other Ambulatory Visit: Payer: Self-pay

## 2022-04-11 DIAGNOSIS — M2550 Pain in unspecified joint: Secondary | ICD-10-CM

## 2022-04-11 DIAGNOSIS — Z8709 Personal history of other diseases of the respiratory system: Secondary | ICD-10-CM

## 2022-04-11 MED ORDER — NAPROXEN 500 MG PO TABS
500.0000 mg | ORAL_TABLET | Freq: Two times a day (BID) | ORAL | 0 refills | Status: DC
  Filled 2022-04-11: qty 60, 30d supply, fill #0

## 2022-05-07 ENCOUNTER — Other Ambulatory Visit: Payer: Self-pay | Admitting: Gerontology

## 2022-05-07 ENCOUNTER — Other Ambulatory Visit: Payer: Self-pay

## 2022-05-07 DIAGNOSIS — M2550 Pain in unspecified joint: Secondary | ICD-10-CM

## 2022-05-07 MED ORDER — NAPROXEN 500 MG PO TABS
500.0000 mg | ORAL_TABLET | Freq: Two times a day (BID) | ORAL | 0 refills | Status: DC
  Filled 2022-05-07: qty 60, 30d supply, fill #0

## 2022-05-27 ENCOUNTER — Other Ambulatory Visit: Payer: Self-pay

## 2022-05-29 NOTE — Congregational Nurse Program (Signed)
  Dept: [phone]   Congregational Nurse Program Note  Date of Encounter: 05/29/2022 Client to [facility] day center to advise that she now has Medicaid: [place] [employer]. Card copied and placed in chart. She was unaware that she can no longer be seen at the [facility], she has an apt on 6/5. RN explained she will now need to find a new PCP and offered assistance with this. Her insurance card listed Dr. Corky Downs. RN gave client the phone number for this physician. RN also explained that if she chose another physician, she would need to contact her insurance company to advise them. Again RN offered assistance. Client declined at this time. She also has an upcoming apt with Dr. Sharlet Salina, gastroenterologist, July 17th. Rn explained she will need to let that office know she now has Medicaid. Phone number given for this office as well. Client also reports she and her husband have moved to [address] From their previous location. Madolyn Frieze Past Medical History: Past Medical History:  Diagnosis Date   Asthma    COPD (chronic obstructive pulmonary disease) ([facility])    GERD (gastroesophageal reflux disease)    History of diverticulitis     Encounter Details:  CNP Questionnaire - 05/29/22 1004       Questionnaire   Ask client: Do you give verbal consent for me to treat you today? Yes    Student Assistance N/A    Location Patient Served  [facility]    Visit Setting with Client Organization    Patient Status Unknown   client reports she has moved to [address]   Insurance Medicaid   Client now has [place] [employer] Medicaid   Insurance/Financial Assistance Referral N/A    Medication N/A   medications provided through [facility] Provider Yes   Client is now a patient at [facility], client now has [employer], will need new PCP   Screening Referrals Made N/A    Medical Referrals Made N/A    Medical Appointment Made N/A     Recently w/o PCP, now 1st time PCP visit completed due to CNs referral or appointment made N/A    Food N/A   recently started receiving food stamps   Transportation N/A    Housing/Utilities N/A    Interpersonal Safety N/A    Interventions Advocate/Support;Navigate Healthcare System    Abnormal to Normal Screening Since Last CN Visit N/A    Screenings CN Performed N/A    Sent Client to Lab for: N/A    Did client attend any of the following based off CNs referral or appointments made? N/A    ED Visit Averted N/A   client was sent to the [facility] ED d/t abdominal pain   Life-Saving Intervention Made N/A

## 2022-06-19 ENCOUNTER — Other Ambulatory Visit: Payer: Self-pay

## 2022-06-19 ENCOUNTER — Encounter: Payer: Self-pay | Admitting: Emergency Medicine

## 2022-06-19 ENCOUNTER — Encounter: Payer: Self-pay | Admitting: Gerontology

## 2022-06-19 ENCOUNTER — Ambulatory Visit: Payer: Self-pay | Admitting: Gerontology

## 2022-06-19 ENCOUNTER — Emergency Department: Payer: Medicaid Other

## 2022-06-19 ENCOUNTER — Inpatient Hospital Stay
Admission: EM | Admit: 2022-06-19 | Discharge: 2022-06-27 | DRG: 392 | Disposition: A | Discharge status: Home / Self Care | Payer: Medicaid Other | Attending: Internal Medicine | Admitting: Internal Medicine

## 2022-06-19 VITALS — BP 129/83 | HR 69 | Temp 97.5°F | Resp 16 | Ht 66.5 in | Wt 148.8 lb

## 2022-06-19 DIAGNOSIS — Z91018 Allergy to other foods: Secondary | ICD-10-CM

## 2022-06-19 DIAGNOSIS — K5720 Diverticulitis of large intestine with perforation and abscess without bleeding: Principal | ICD-10-CM | POA: Diagnosis present

## 2022-06-19 DIAGNOSIS — K219 Gastro-esophageal reflux disease without esophagitis: Secondary | ICD-10-CM | POA: Diagnosis present

## 2022-06-19 DIAGNOSIS — K5780 Diverticulitis of intestine, part unspecified, with perforation and abscess without bleeding: Secondary | ICD-10-CM

## 2022-06-19 DIAGNOSIS — K5792 Diverticulitis of intestine, part unspecified, without perforation or abscess without bleeding: Secondary | ICD-10-CM | POA: Diagnosis not present

## 2022-06-19 DIAGNOSIS — Z5986 Financial insecurity: Secondary | ICD-10-CM | POA: Diagnosis not present

## 2022-06-19 DIAGNOSIS — Z91030 Bee allergy status: Secondary | ICD-10-CM | POA: Diagnosis not present

## 2022-06-19 DIAGNOSIS — J449 Chronic obstructive pulmonary disease, unspecified: Secondary | ICD-10-CM

## 2022-06-19 DIAGNOSIS — F1490 Cocaine use, unspecified, uncomplicated: Secondary | ICD-10-CM | POA: Diagnosis present

## 2022-06-19 DIAGNOSIS — Z801 Family history of malignant neoplasm of trachea, bronchus and lung: Secondary | ICD-10-CM | POA: Diagnosis not present

## 2022-06-19 DIAGNOSIS — J4489 Other specified chronic obstructive pulmonary disease: Secondary | ICD-10-CM | POA: Diagnosis present

## 2022-06-19 DIAGNOSIS — Z8349 Family history of other endocrine, nutritional and metabolic diseases: Secondary | ICD-10-CM

## 2022-06-19 DIAGNOSIS — Z5982 Transportation insecurity: Secondary | ICD-10-CM

## 2022-06-19 DIAGNOSIS — Z8049 Family history of malignant neoplasm of other genital organs: Secondary | ICD-10-CM | POA: Diagnosis not present

## 2022-06-19 DIAGNOSIS — R109 Unspecified abdominal pain: Secondary | ICD-10-CM | POA: Insufficient documentation

## 2022-06-19 DIAGNOSIS — E440 Moderate protein-calorie malnutrition: Secondary | ICD-10-CM | POA: Diagnosis present

## 2022-06-19 DIAGNOSIS — I10 Essential (primary) hypertension: Secondary | ICD-10-CM

## 2022-06-19 DIAGNOSIS — R10814 Left lower quadrant abdominal tenderness: Secondary | ICD-10-CM | POA: Diagnosis present

## 2022-06-19 DIAGNOSIS — Z8719 Personal history of other diseases of the digestive system: Secondary | ICD-10-CM

## 2022-06-19 DIAGNOSIS — R300 Dysuria: Secondary | ICD-10-CM | POA: Insufficient documentation

## 2022-06-19 DIAGNOSIS — Z803 Family history of malignant neoplasm of breast: Secondary | ICD-10-CM

## 2022-06-19 DIAGNOSIS — Z820 Family history of epilepsy and other diseases of the nervous system: Secondary | ICD-10-CM

## 2022-06-19 DIAGNOSIS — F17210 Nicotine dependence, cigarettes, uncomplicated: Secondary | ICD-10-CM | POA: Diagnosis present

## 2022-06-19 DIAGNOSIS — Z818 Family history of other mental and behavioral disorders: Secondary | ICD-10-CM | POA: Diagnosis not present

## 2022-06-19 DIAGNOSIS — K567 Ileus, unspecified: Secondary | ICD-10-CM | POA: Diagnosis present

## 2022-06-19 DIAGNOSIS — Z833 Family history of diabetes mellitus: Secondary | ICD-10-CM | POA: Diagnosis not present

## 2022-06-19 DIAGNOSIS — Z7951 Long term (current) use of inhaled steroids: Secondary | ICD-10-CM | POA: Diagnosis not present

## 2022-06-19 DIAGNOSIS — Z880 Allergy status to penicillin: Secondary | ICD-10-CM | POA: Diagnosis not present

## 2022-06-19 DIAGNOSIS — Z8709 Personal history of other diseases of the respiratory system: Secondary | ICD-10-CM

## 2022-06-19 DIAGNOSIS — R1032 Left lower quadrant pain: Secondary | ICD-10-CM

## 2022-06-19 HISTORY — DX: Diverticulitis of intestine, part unspecified, without perforation or abscess without bleeding: K57.92

## 2022-06-19 HISTORY — DX: Diverticulitis of intestine, part unspecified, with perforation and abscess without bleeding: K57.80

## 2022-06-19 LAB — POCT URINALYSIS DIPSTICK
Bilirubin, UA: NEGATIVE
Blood, UA: NEGATIVE
Glucose, UA: NEGATIVE
Ketones, UA: NEGATIVE
Nitrite, UA: NEGATIVE
Protein, UA: NEGATIVE
Spec Grav, UA: 1.025 (ref 1.010–1.025)
Urobilinogen, UA: 0.2 E.U./dL
pH, UA: 5.5 (ref 5.0–8.0)

## 2022-06-19 LAB — CBC
HCT: 43.9 % (ref 36.0–46.0)
Hemoglobin: 14.1 g/dL (ref 12.0–15.0)
MCH: 27.1 pg (ref 26.0–34.0)
MCHC: 32.1 g/dL (ref 30.0–36.0)
MCV: 84.4 fL (ref 80.0–100.0)
Platelets: 285 10*3/uL (ref 150–400)
RBC: 5.2 MIL/uL — ABNORMAL HIGH (ref 3.87–5.11)
RDW: 14.8 % (ref 11.5–15.5)
WBC: 8.6 10*3/uL (ref 4.0–10.5)
nRBC: 0 % (ref 0.0–0.2)

## 2022-06-19 LAB — URINALYSIS, ROUTINE W REFLEX MICROSCOPIC
Bilirubin Urine: NEGATIVE
Glucose, UA: NEGATIVE mg/dL
Hgb urine dipstick: NEGATIVE
Ketones, ur: NEGATIVE mg/dL
Leukocytes,Ua: NEGATIVE
Nitrite: NEGATIVE
Protein, ur: NEGATIVE mg/dL
Specific Gravity, Urine: 1.009 (ref 1.005–1.030)
pH: 5 (ref 5.0–8.0)

## 2022-06-19 LAB — COMPREHENSIVE METABOLIC PANEL
ALT: 40 U/L (ref 0–44)
AST: 35 U/L (ref 15–41)
Albumin: 4.2 g/dL (ref 3.5–5.0)
Alkaline Phosphatase: 75 U/L (ref 38–126)
Anion gap: 11 (ref 5–15)
BUN: 11 mg/dL (ref 8–23)
CO2: 25 mmol/L (ref 22–32)
Calcium: 9 mg/dL (ref 8.9–10.3)
Chloride: 100 mmol/L (ref 98–111)
Creatinine, Ser: 0.54 mg/dL (ref 0.44–1.00)
GFR, Estimated: >60 mL/min (ref >60)
Glucose, Bld: 99 mg/dL (ref 70–99)
Potassium: 3.9 mmol/L (ref 3.5–5.1)
Sodium: 136 mmol/L (ref 135–145)
Total Bilirubin: 0.8 mg/dL (ref 0.3–1.2)
Total Protein: 7.6 g/dL (ref 6.5–8.1)

## 2022-06-19 LAB — LIPASE, BLOOD: Lipase: 46 U/L (ref 11–51)

## 2022-06-19 MED ORDER — LACTATED RINGERS IV SOLN: INTRAVENOUS | Status: DC

## 2022-06-19 MED ORDER — CIPROFLOXACIN IN D5W 400 MG/200ML IV SOLN
400.0000 mg | Freq: Two times a day (BID) | INTRAVENOUS | Status: DC
  Administered 2022-06-20 – 2022-06-26 (×13): 400 mg via INTRAVENOUS
  Filled 2022-06-19 (×14): qty 200

## 2022-06-19 MED ORDER — METRONIDAZOLE 500 MG/100ML IV SOLN
500.0000 mg | Freq: Once | INTRAVENOUS | Status: AC
  Administered 2022-06-19: 500 mg via INTRAVENOUS
  Filled 2022-06-19: qty 100

## 2022-06-19 MED ORDER — ACETAMINOPHEN 325 MG PO TABS: 650.0000 mg | ORAL_TABLET | Freq: Four times a day (QID) | ORAL | Status: DC | PRN

## 2022-06-19 MED ORDER — CIPROFLOXACIN IN D5W 400 MG/200ML IV SOLN
400.0000 mg | Freq: Once | INTRAVENOUS | Status: AC
  Administered 2022-06-19: 400 mg via INTRAVENOUS
  Filled 2022-06-19: qty 200

## 2022-06-19 MED ORDER — IOHEXOL 300 MG/ML  SOLN
100.0000 mL | Freq: Once | INTRAMUSCULAR | Status: AC | PRN
  Administered 2022-06-19: 100 mL via INTRAVENOUS

## 2022-06-19 MED ORDER — MORPHINE SULFATE (PF) 2 MG/ML IV SOLN
2.0000 mg | INTRAVENOUS | Status: DC | PRN
  Administered 2022-06-19 – 2022-06-20 (×3): 2 mg via INTRAVENOUS
  Filled 2022-06-19 (×5): qty 1

## 2022-06-19 MED ORDER — HYDROCODONE-ACETAMINOPHEN 5-325 MG PO TABS
1.0000 | ORAL_TABLET | ORAL | Status: DC | PRN
  Administered 2022-06-19: 1 via ORAL
  Administered 2022-06-20 – 2022-06-21 (×5): 2 via ORAL
  Administered 2022-06-21: 1 via ORAL
  Administered 2022-06-21: 2 via ORAL
  Administered 2022-06-22: 1 via ORAL
  Administered 2022-06-22: 2 via ORAL
  Administered 2022-06-22: 1 via ORAL
  Administered 2022-06-22 (×3): 2 via ORAL
  Administered 2022-06-23: 1 via ORAL
  Administered 2022-06-23 – 2022-06-26 (×20): 2 via ORAL
  Administered 2022-06-27: 1 via ORAL
  Administered 2022-06-27: 2 via ORAL
  Filled 2022-06-19 (×2): qty 2
  Filled 2022-06-19: qty 1
  Filled 2022-06-19 (×2): qty 2
  Filled 2022-06-19: qty 1
  Filled 2022-06-19 (×6): qty 2
  Filled 2022-06-19: qty 1
  Filled 2022-06-19 (×11): qty 2
  Filled 2022-06-19: qty 1
  Filled 2022-06-19 (×14): qty 2

## 2022-06-19 MED ORDER — ACETAMINOPHEN 650 MG RE SUPP: 650.0000 mg | Freq: Four times a day (QID) | RECTAL | Status: DC | PRN

## 2022-06-19 MED ORDER — METRONIDAZOLE 500 MG/100ML IV SOLN
500.0000 mg | Freq: Two times a day (BID) | INTRAVENOUS | Status: DC
  Administered 2022-06-20 – 2022-06-26 (×13): 500 mg via INTRAVENOUS
  Filled 2022-06-19 (×13): qty 100

## 2022-06-19 MED ORDER — SODIUM CHLORIDE 0.9 % IV BOLUS
1000.0000 mL | Freq: Once | INTRAVENOUS | Status: AC
  Administered 2022-06-19: 1000 mL via INTRAVENOUS

## 2022-06-19 MED ORDER — ALBUTEROL SULFATE (2.5 MG/3ML) 0.083% IN NEBU: 2.5000 mg | INHALATION_SOLUTION | RESPIRATORY_TRACT | Status: DC | PRN

## 2022-06-19 MED ORDER — ONDANSETRON HCL 4 MG/2ML IJ SOLN
4.0000 mg | Freq: Four times a day (QID) | INTRAMUSCULAR | Status: DC | PRN
  Administered 2022-06-20 – 2022-06-26 (×11): 4 mg via INTRAVENOUS
  Filled 2022-06-19 (×11): qty 2

## 2022-06-19 MED ORDER — KETOROLAC TROMETHAMINE 30 MG/ML IJ SOLN
30.0000 mg | Freq: Four times a day (QID) | INTRAMUSCULAR | Status: AC | PRN
  Administered 2022-06-19 – 2022-06-23 (×7): 30 mg via INTRAVENOUS
  Filled 2022-06-19 (×7): qty 1

## 2022-06-19 MED ORDER — ONDANSETRON HCL 4 MG/2ML IJ SOLN
4.0000 mg | Freq: Once | INTRAMUSCULAR | Status: AC
  Administered 2022-06-19: 4 mg via INTRAVENOUS
  Filled 2022-06-19: qty 2

## 2022-06-19 MED ORDER — ONDANSETRON HCL 4 MG PO TABS
4.0000 mg | ORAL_TABLET | Freq: Four times a day (QID) | ORAL | Status: DC | PRN
  Administered 2022-06-22 – 2022-06-27 (×9): 4 mg via ORAL
  Filled 2022-06-19 (×9): qty 1

## 2022-06-19 MED ORDER — MORPHINE SULFATE (PF) 4 MG/ML IV SOLN
4.0000 mg | Freq: Once | INTRAVENOUS | Status: AC
  Administered 2022-06-19: 4 mg via INTRAVENOUS
  Filled 2022-06-19: qty 1

## 2022-06-19 NOTE — Assessment & Plan Note (Signed)
DuoNebs as needed 

## 2022-06-19 NOTE — Assessment & Plan Note (Addendum)
Patient with recurrent diverticulitis( last occurrence 01/2022) showing "small extraluminal enhancing fluid collection adjacent to the distal descending colon measuring 9 x 9 x 12 mm compatible with small abscess"  Continue with metronidazole and ciprofloxacin, as patient is pen allergic Pain control as ordered Clear liquids Surgery was consulted from the ED, recommended possible IR to might be limited due to small size of abscess Will consult surgery to follow for official recommendations as well as IR

## 2022-06-19 NOTE — ED Triage Notes (Signed)
LLQ pain, left flank pain, and left groin pain x 2 days. Pt states these were same symptoms she had when admitted for diverticulitis 2 months ago. Denies v/d.

## 2022-06-19 NOTE — Progress Notes (Signed)
   I have reviewed the images reported below.   Appreciate Hospitalist Admitting this pt, WBC normal.  Full consult to follow.   Clearly, not amenable to IR for drainage.  Will treat with IV abx, and serial exams.     CLINICAL DATA:  Left lower quadrant pain   EXAM: CT ABDOMEN AND PELVIS WITH CONTRAST   TECHNIQUE: Multidetector CT imaging of the abdomen and pelvis was performed using the standard protocol following bolus administration of intravenous contrast.   RADIATION DOSE REDUCTION: This exam was performed according to the departmental dose-optimization program which includes automated exposure control, adjustment of the mA and/or kV according to patient size and/or use of iterative reconstruction technique.   CONTRAST:  OMNIPAQUE IOHEXOL 300 MG/ML  SOLN   COMPARISON:  CT abdomen and pelvis 02/06/2022   FINDINGS: Lower chest: No acute abnormality. There is a new 4 mm left lower lobe ground-glass nodule image 4/16.   Hepatobiliary: No focal liver abnormality is seen. Status post cholecystectomy. No biliary dilatation.   Pancreas: Unremarkable. No pancreatic ductal dilatation or surrounding inflammatory changes.   Spleen: Normal in size without focal abnormality.   Adrenals/Urinary Tract: Adrenal glands are unremarkable. Kidneys are normal, without renal calculi, focal lesion, or hydronephrosis. Bladder is unremarkable.   Stomach/Bowel: There is descending and sigmoid colon diverticulosis. There is wall thickening and mild inflammation of the distal descending colon most compatible with acute diverticulitis. There is a extraluminal enhancing fluid collection adjacent to the distal descending colon which is thick walled measuring 9 by 9 by 12 mm. This collection contains a small focus of air best seen on coronal image 5/28 and 2/52. There is no bowel obstruction. The appendix, small bowel and stomach are within normal limits.   Vascular/Lymphatic: Aortic  atherosclerosis. No enlarged abdominal or pelvic lymph nodes.   Reproductive: Uterus and bilateral adnexa are unremarkable.   Other: Small fat containing umbilical hernia.  No ascites.   Musculoskeletal: No acute or significant osseous findings.   IMPRESSION: 1. Acute diverticulitis of the distal descending colon. There is a small extraluminal enhancing fluid collection adjacent to the distal descending colon measuring 9 x 9 x 12 mm compatible with small abscess. 2. Left ground-glass pulmonary nodule measuring 4 mm. Per Fleischner Society Guidelines, no routine follow-up imaging is recommended. These guidelines do not apply to immunocompromised patients and patients with cancer. Follow up in patients with significant comorbidities as clinically warranted. For lung cancer screening, adhere to Lung-RADS guidelines. Reference: Radiology. 2017; 284(1):228-43.   Aortic Atherosclerosis (ICD10-I70.0).     Electronically Signed   By: Darliss Cheney M.D.   On: 06/19/2022 17:23

## 2022-06-19 NOTE — Progress Notes (Signed)
Established Patient Office Visit  Subjective   Patient ID: Stacy Gilmore, female    DOB: Nov 18, 1960  Age: 62 y.o. MRN: [id number]  Chief Complaint  Patient presents with   Follow-up    Last visit. Patient has active Medicaid effective 04/15/22   Abdominal Pain    Patient c/o severe abdominal pain, will dark stools yesterday. Patient states she has increased thirst and drinking more water, but she thinks she is not urinating enough related to the amount of water she is drinking.    HPI   Stacy Gilmore is a 62 year old female with history of GERD, asthma, COPD, who presents for c/o constant non radiating  excruciating pain to left lower and left upper abdominal quadrant, and flank pain.She states that symptoms started 3 days ago,had fever and chills yesterday. She describes pain as " stabbing with a sharp object"   She states that pain is a 7/10 at rest and 10/10 when touched. She reports having a normal bowel movement today, denies constipation, diarrhea and hematochezia, though she endorses an episode of dark tarry loose stool 3 days ago. She states that symptoms are associated with nausea, but denies vomiting. She also c/o bilateral flank pain, decreased urine output, dysuria, urinary urgency, states that urine is dark colored and cloudy. Overall, she states that she's concerned about her abdominal pain because she has a history of diverticulitis.   Review of Systems  Respiratory: Negative.    Cardiovascular: Negative.   Gastrointestinal:  Positive for abdominal pain, melena and nausea.  Genitourinary:  Positive for dysuria, flank pain and urgency.  Neurological: Negative.   Psychiatric/Behavioral: Negative.        Objective:     BP 129/83 (BP Location: Left Arm, Patient Position: Sitting, Cuff Size: Normal)   Pulse 69   Temp (!) 97.5 F (36.4 C) (Oral)   Resp 16   Ht 5' 6.5" (1.689 m)   Wt 148 lb 12.8 oz (67.5 kg)   SpO2 95%   BMI 23.66 kg/m  BP Readings from  Last 3 Encounters:  06/19/22 129/83  03/19/22 130/83  02/14/22 (!) 146/81   Wt Readings from Last 3 Encounters:  06/19/22 148 lb 12.8 oz (67.5 kg)  03/19/22 153 lb 9.6 oz (69.7 kg)  02/14/22 158 lb 4.8 oz (71.8 kg)      Physical Exam HENT:     Head: Normocephalic.  Abdominal:     General: Bowel sounds are normal. There is no distension.     Palpations: Abdomen is soft. There is no shifting dullness, hepatomegaly, splenomegaly or pulsatile mass.     Tenderness: There is abdominal tenderness in the left upper quadrant and left lower quadrant. There is right CVA tenderness and left CVA tenderness.  Skin:    General: Skin is warm.  Neurological:     General: No focal deficit present.     Mental Status: She is alert and oriented to person, place, and time.  Psychiatric:        Mood and Affect: Mood normal.        Behavior: Behavior normal.      No results found for any visits on 06/19/22.  Last CBC Lab Results  Component Value Date   WBC 7.4 02/07/2022   HGB 10.8 (L) 02/07/2022   HCT 32.9 (L) 02/07/2022   MCV 85.0 02/07/2022   MCH 27.9 02/07/2022   RDW 12.7 02/07/2022   PLT 198 02/07/2022   Last metabolic panel Lab Results  Component Value  Date   GLUCOSE 113 (H) 02/09/2022   NA 142 02/09/2022   K 4.1 02/09/2022   CL 105 02/09/2022   CO2 28 02/09/2022   BUN <5 (L) 02/09/2022   CREATININE 0.62 02/09/2022   GFRNONAA >60 02/09/2022   CALCIUM 8.9 02/09/2022   PROT 7.4 02/06/2022   ALBUMIN 3.6 02/06/2022   LABGLOB 2.5 12/11/2021   AGRATIO 1.7 12/11/2021   BILITOT 0.9 02/06/2022   ALKPHOS 67 02/06/2022   AST 45 (H) 02/06/2022   ALT 47 (H) 02/06/2022   ANIONGAP 9 02/09/2022   Last lipids Lab Results  Component Value Date   CHOL 161 12/11/2021   HDL 42 12/11/2021   LDLCALC 93 12/11/2021   TRIG 145 12/11/2021   CHOLHDL 3.8 12/11/2021   Last hemoglobin A1c Lab Results  Component Value Date   HGBA1C 5.6 12/11/2021   Last thyroid functions No results  found for: "TSH", "T3TOTAL", "T4TOTAL", "THYROIDAB"    The 10-year ASCVD risk score (Arnett DK, et al., 2019) is: 7.6%    Assessment & Plan:   1. Dysuria - POCT Urinalysis checked during visit showed small 1+ leucocytes, unable to send lab because she has active Medicaid. - POCT Urinalysis Dipstick  2. Left lower quadrant abdominal pain - Unknown etiology of abdominal pain, tenderness with palpation to left upper and lower quadrant, she was advised to go to the ED. Patient was not in serious distress for EMS to be called, clinic arranged for Taxi to transport patient to ED.   No follow-ups on file. She has no follow up appointment because she has active Medicaid. [facility] wishes her well with her care.   Shayle Donahoo Trellis Paganini, NP

## 2022-06-19 NOTE — Assessment & Plan Note (Signed)
>>  ASSESSMENT AND PLAN FOR HISTORY OF COPD WRITTEN ON 06/19/2022  8:52 PM BY Andris Baumann, MD  DuoNebs as needed

## 2022-06-19 NOTE — Assessment & Plan Note (Signed)
Protonix daily 

## 2022-06-19 NOTE — ED Provider Notes (Signed)
[facility] Provider Note  Patient Contact: 4:06 PM (approximate)   History   Abdominal Pain   HPI  Stacy Gilmore is a 62 y.o. female who presents the emergency department complaining of sharp left-sided abdominal pain.  Patient has pain in the left upper and lower quadrants.  Patient states that roughly 2 months ago she was admitted for diverticulitis.  She has been doing well until last several days.  She went to her primary care provider and was referred to the ED.  Patient has no blood in her stool, no dysuria, polyuria or hematuria.  No fevers or chills, nausea vomiting, diarrhea or constipation.     Physical Exam   Triage Vital Signs: ED Triage Vitals [06/19/22 1314]  Enc Vitals Group     BP (!) 133/98     Pulse Rate 67     Resp 18     Temp 97.7 F (36.5 C)     Temp src      SpO2 97 %     Weight 148 lb 9.4 oz (67.4 kg)     Height 5\' 6"  (1.676 m)     Head Circumference      Peak Flow      Pain Score 10     Pain Loc      Pain Edu?      Excl. in GC?     Most recent vital signs: Vitals:   06/19/22 1314 06/19/22 2116  BP: (!) 133/98   Pulse: 67   Resp: 18   Temp: 97.7 F (36.5 C) 97.8 F (36.6 C)  SpO2: 97%      General: Alert and in no acute distress.  Cardiovascular:  Good peripheral perfusion Respiratory: Normal respiratory effort without tachypnea or retractions. Lungs CTAB.  Gastrointestinal: Bowel sounds 4 quadrants.  Soft to palpation.  Patient is now tender in the left upper and lower quadrants.. No guarding or rigidity. No palpable masses. No distention. No CVA tenderness. Musculoskeletal: Full range of motion to all extremities.  Neurologic:  No gross focal neurologic deficits are appreciated.  Skin:   No rash noted Other:   ED Results / Procedures / Treatments   Labs (all labs ordered are listed, but only abnormal results are displayed) Labs Reviewed  CBC - Abnormal; Notable for the following components:       Result Value   RBC 5.20 (*)    All other components within normal limits  URINALYSIS, ROUTINE W REFLEX MICROSCOPIC - Abnormal; Notable for the following components:   Color, Urine YELLOW (*)    APPearance CLEAR (*)    All other components within normal limits  LIPASE, BLOOD  COMPREHENSIVE METABOLIC PANEL  HIV ANTIBODY (ROUTINE TESTING W REFLEX)     EKG     RADIOLOGY  I personally viewed, evaluated, and interpreted these images as part of my medical decision making, as well as reviewing the written report by the radiologist.  ED Provider Interpretation: Findings consistent diverticulitis with a small fluid collection adjacent to this area concerning for small developing abscess.  CT ABDOMEN PELVIS W CONTRAST  Result Date: 06/19/2022 CLINICAL DATA:  Left lower quadrant pain EXAM: CT ABDOMEN AND PELVIS WITH CONTRAST TECHNIQUE: Multidetector CT imaging of the abdomen and pelvis was performed using the standard protocol following bolus administration of intravenous contrast. RADIATION DOSE REDUCTION: This exam was performed according to the departmental dose-optimization program which includes automated exposure control, adjustment of the mA and/or kV according to patient  size and/or use of iterative reconstruction technique. CONTRAST:  OMNIPAQUE IOHEXOL 300 MG/ML  SOLN COMPARISON:  CT abdomen and pelvis 02/06/2022 FINDINGS: Lower chest: No acute abnormality. There is a new 4 mm left lower lobe ground-glass nodule image 4/16. Hepatobiliary: No focal liver abnormality is seen. Status post cholecystectomy. No biliary dilatation. Pancreas: Unremarkable. No pancreatic ductal dilatation or surrounding inflammatory changes. Spleen: Normal in size without focal abnormality. Adrenals/Urinary Tract: Adrenal glands are unremarkable. Kidneys are normal, without renal calculi, focal lesion, or hydronephrosis. Bladder is unremarkable. Stomach/Bowel: There is descending and sigmoid colon  diverticulosis. There is wall thickening and mild inflammation of the distal descending colon most compatible with acute diverticulitis. There is a extraluminal enhancing fluid collection adjacent to the distal descending colon which is thick walled measuring 9 by 9 by 12 mm. This collection contains a small focus of air best seen on coronal image 5/28 and 2/52. There is no bowel obstruction. The appendix, small bowel and stomach are within normal limits. Vascular/Lymphatic: Aortic atherosclerosis. No enlarged abdominal or pelvic lymph nodes. Reproductive: Uterus and bilateral adnexa are unremarkable. Other: Small fat containing umbilical hernia.  No ascites. Musculoskeletal: No acute or significant osseous findings. IMPRESSION: 1. Acute diverticulitis of the distal descending colon. There is a small extraluminal enhancing fluid collection adjacent to the distal descending colon measuring 9 x 9 x 12 mm compatible with small abscess. 2. Left ground-glass pulmonary nodule measuring 4 mm. Per Fleischner Society Guidelines, no routine follow-up imaging is recommended. These guidelines do not apply to immunocompromised patients and patients with cancer. Follow up in patients with significant comorbidities as clinically warranted. For lung cancer screening, adhere to Lung-RADS guidelines. Reference: Radiology. 2017; 284(1):228-43. Aortic Atherosclerosis (ICD10-I70.0). Electronically Signed   By: Darliss Cheney M.D.   On: 06/19/2022 17:23    PROCEDURES:  Critical Care performed: No  Procedures   MEDICATIONS ORDERED IN ED: Medications  albuterol (PROVENTIL) (2.5 MG/3ML) 0.083% nebulizer solution 2.5 mg (has no administration in time range)  acetaminophen (TYLENOL) tablet 650 mg (has no administration in time range)    Or  acetaminophen (TYLENOL) suppository 650 mg (has no administration in time range)  ondansetron (ZOFRAN) tablet 4 mg (has no administration in time range)    Or  ondansetron (ZOFRAN)  injection 4 mg (has no administration in time range)  lactated ringers infusion ( Intravenous New Bag/Given 06/19/22 2115)  HYDROcodone-acetaminophen (NORCO/VICODIN) 5-325 MG per tablet 1-2 tablet (1 tablet Oral Given 06/19/22 2344)  ketorolac (TORADOL) 30 MG/ML injection 30 mg (30 mg Intravenous Given 06/19/22 2115)  morphine (PF) 2 MG/ML injection 2 mg (2 mg Intravenous Given 06/19/22 2115)  ciprofloxacin (CIPRO) IVPB 400 mg (has no administration in time range)  metroNIDAZOLE (FLAGYL) IVPB 500 mg (has no administration in time range)  sodium chloride 0.9 % bolus 1,000 mL (0 mLs Intravenous Stopped 06/19/22 1820)  morphine (PF) 4 MG/ML injection 4 mg (4 mg Intravenous Given 06/19/22 1635)  ondansetron (ZOFRAN) injection 4 mg (4 mg Intravenous Given 06/19/22 1636)  iohexol (OMNIPAQUE) 300 MG/ML solution 100 mL (100 mLs Intravenous Contrast Given 06/19/22 1646)  ciprofloxacin (CIPRO) IVPB 400 mg (0 mg Intravenous Stopped 06/19/22 1958)  metroNIDAZOLE (FLAGYL) IVPB 500 mg (0 mg Intravenous Stopped 06/19/22 1958)     IMPRESSION / MDM / ASSESSMENT AND PLAN / ED COURSE  I reviewed the triage vital signs and the nursing notes.  Differential diagnosis includes, but is not limited to, diverticulitis, colonic perforation, colonic obstruction, colitis, UTI, pyelonephritis, nephrolithiasis  Patient's presentation is most consistent with acute presentation with potential threat to life or bodily function.   Patient's diagnosis is consistent with diverticulitis with abscess.  Patient presents emergency department with complaints of left lower quadrant abdominal pain.  History of diverticulitis with recent admission.  Patient was very tender in the left lower quadrant.  Labs are reassuring.  Imaging returns with findings consistent with diverticulitis with small abscess.  Reached out to surgery who recommends admission with IV antibiotics.  Surgery will consult and if area is not resolving,  IR may also be considered.  At this time reached out to hospitalist for admission.  Patient is allergic to penicillin and Cipro and Flagyl were used instead of Zosyn for antibiotic coverage..  Patient care transferred to the hospitalist team at this time.    FINAL CLINICAL IMPRESSION(S) / ED DIAGNOSES   Final diagnoses:  Diverticulitis of large intestine with abscess without bleeding     Rx / DC Orders   ED Discharge Orders     None        Note:  This document was prepared using Dragon voice recognition software and may include unintentional dictation errors.   Lanette Hampshire 06/20/22 0008    Georga Hacking, MD 06/20/22 [id number]

## 2022-06-19 NOTE — H&P (Signed)
History and Physical    Patient: Stacy Gilmore Stacy Gilmore DOB: [birth year] DOA: 06/19/2022 DOS: the patient was seen and examined on 06/19/2022 PCP: Rolm Gala, NP  Patient coming from: Home  Chief Complaint:  Chief Complaint  Patient presents with   Abdominal Pain    HPI: Stacy Gilmore is a 62 y.o. female with medical history significant for GERD asthma and COPD, hospitalized in January 2024 with acute uncomplicated diverticulitis as well as E. coli UTI, who presentsThe ED with left upper and lower quadrant pain that started several days prior.  She denies fevers or chills, nausea or vomiting, diarrhea or constipation or dysuria. ED course and data review: BP 133/98 with otherwise normal vitals.  Labs including CBC, CMP, lipase and urinalysis were all unremarkable.  CT abdomen and pelvis concerning for acute diverticulitis with possible abscess as follows.  I calling for further MPRESSION: 1. Acute diverticulitis of the distal descending colon. There is a small extraluminal enhancing fluid collection adjacent to the distal descending colon measuring 9 x 9 x 12 mm compatible with small abscess. 2. Left ground-glass pulmonary nodule measuring 4 mm. ...no routine follow-up imaging is recommended   Patient treated with morphine for pain, started on Cipro and metronidazole and hospitalist consulted for admission.   Review of Systems: As mentioned in the history of present illness. All other systems reviewed and are negative.  Past Medical History:  Diagnosis Date   Asthma    COPD (chronic obstructive pulmonary disease) (HCC)    GERD (gastroesophageal reflux disease)    History of diverticulitis    Past Surgical History:  Procedure Laterality Date   CERVICAL SPINE SURGERY     CHOLECYSTECTOMY     TUBAL LIGATION     Social History:  reports that she has been smoking cigarettes. She has a 20.00 pack-year smoking history. She has never used smokeless tobacco. She  reports current alcohol use of about 8.0 standard drinks of alcohol per week. She reports that she does not currently use drugs after having used the following drugs: "Crack" cocaine, Cocaine, Methamphetamines, and Marijuana.  Allergies  Allergen Reactions   Bee Venom Swelling   Penicillins Hives and Swelling   Armoracia Rusticana Ext (Horseradish) Rash    Blisters the mouth   Other Rash    Ragu spaghetti sauce    Family History  Problem Relation Age of Onset   Anxiety disorder Mother    Lung cancer Mother    Thyroid disease Mother    Epilepsy Mother    Other Father        unknown medical history   Breast cancer Sister    Cervical cancer Sister    Epilepsy Brother    Other Maternal Grandmother        unknown medical history   Other Maternal Grandfather        unknown medical history   Diabetes Paternal Grandmother    Other Paternal Grandfather        unknown medical history    Prior to Admission medications   Medication Sig Start Date End Date Taking? Authorizing Provider  albuterol (VENTOLIN HFA) 108 (90 Base) MCG/ACT inhaler Inhale 2 puffs into the lungs every 4 (four) hours as needed for wheezing or shortness of breath. 02/14/22   Tukov-Yual, Alroy Bailiff, NP  fluticasone furoate-vilanterol (BREO ELLIPTA) 100-25 MCG/ACT AEPB Inhale 1 puff into the lungs daily. Rinse and brush thoroughly after administration 02/14/22   Tukov-Yual, Magdalene S, NP  naproxen (NAPROSYN) 500 MG  tablet Take 1 tablet (500 mg total) by mouth 2 (two) times daily with a meal. Patient not taking: Reported on 06/19/2022 05/07/22   Iloabachie, Chioma E, NP  omeprazole (PRILOSEC) 20 MG capsule Take 1 capsule (20 mg total) by mouth daily. Patient not taking: Reported on 06/19/2022 02/14/22   Andreas Ohm, NP  polyethylene glycol powder (MIRALAX) 17 GM/SCOOP powder Take 17 g by mouth daily. Patient not taking: Reported on 06/19/2022 03/19/22   Odette Fraction, NP  senna-docusate (SENOKOT-S) 8.6-50 MG tablet Take 1  tablet by mouth at bedtime as needed for mild constipation. 02/14/22   Andreas Ohm, NP    Physical Exam: Vitals:   06/19/22 1314  BP: (!) 133/98  Pulse: 67  Resp: 18  Temp: 97.7 F (36.5 C)  SpO2: 97%  Weight: 67.4 kg  Height: 5\' 6"  (1.676 m)   Physical Exam Vitals and nursing note reviewed.  Constitutional:      General: She is not in acute distress. HENT:     Head: Normocephalic and atraumatic.  Cardiovascular:     Rate and Rhythm: Normal rate and regular rhythm.     Heart sounds: Normal heart sounds.  Pulmonary:     Effort: Pulmonary effort is normal.     Breath sounds: Normal breath sounds.  Abdominal:     Palpations: Abdomen is soft.     Tenderness: There is abdominal tenderness in the left lower quadrant.  Neurological:     Mental Status: Mental status is at baseline.     Labs on Admission: I have personally reviewed following labs and imaging studies  CBC: Recent Labs  Lab 06/19/22 1316  WBC 8.6  HGB 14.1  HCT 43.9  MCV 84.4  PLT 285   Basic Metabolic Panel: Recent Labs  Lab 06/19/22 1316  NA 136  K 3.9  CL 100  CO2 25  GLUCOSE 99  BUN 11  CREATININE 0.54  CALCIUM 9.0   GFR: Estimated Creatinine Clearance: 69.1 mL/min (by C-G formula based on SCr of 0.54 mg/dL). Liver Function Tests: Recent Labs  Lab 06/19/22 1316  AST 35  ALT 40  ALKPHOS 75  BILITOT 0.8  PROT 7.6  ALBUMIN 4.2   Recent Labs  Lab 06/19/22 1316  LIPASE 46   No results for input(s): "AMMONIA" in the last 168 hours. Coagulation Profile: No results for input(s): "INR", "PROTIME" in the last 168 hours. Cardiac Enzymes: No results for input(s): "CKTOTAL", "CKMB", "CKMBINDEX", "TROPONINI" in the last 168 hours. BNP (last 3 results) No results for input(s): "PROBNP" in the last 8760 hours. HbA1C: No results for input(s): "HGBA1C" in the last 72 hours. CBG: No results for input(s): "GLUCAP" in the last 168 hours. Lipid Profile: No results for input(s):  "CHOL", "HDL", "LDLCALC", "TRIG", "CHOLHDL", "LDLDIRECT" in the last 72 hours. Thyroid Function Tests: No results for input(s): "TSH", "T4TOTAL", "FREET4", "T3FREE", "THYROIDAB" in the last 72 hours. Anemia Panel: No results for input(s): "VITAMINB12", "FOLATE", "FERRITIN", "TIBC", "IRON", "RETICCTPCT" in the last 72 hours. Urine analysis:    Component Value Date/Time   COLORURINE YELLOW (A) 06/19/2022 1316   APPEARANCEUR CLEAR (A) 06/19/2022 1316   APPEARANCEUR Cloudy (A) 01/17/2022 1348   LABSPEC 1.009 06/19/2022 1316   LABSPEC 1.009 04/16/2013 0735   PHURINE 5.0 06/19/2022 1316   GLUCOSEU NEGATIVE 06/19/2022 1316   GLUCOSEU Negative 04/16/2013 0735   HGBUR NEGATIVE 06/19/2022 1316   BILIRUBINUR NEGATIVE 06/19/2022 1316   BILIRUBINUR negative 06/19/2022 1225   BILIRUBINUR Negative 01/17/2022 1348  BILIRUBINUR Negative 04/16/2013 0735   KETONESUR NEGATIVE 06/19/2022 1316   PROTEINUR NEGATIVE 06/19/2022 1316   PROTEINUR Negative 06/19/2022 1225   UROBILINOGEN 0.2 06/19/2022 1225   NITRITE NEGATIVE 06/19/2022 1316   NITRITE negative 06/19/2022 1225   LEUKOCYTESUR NEGATIVE 06/19/2022 1316   LEUKOCYTESUR Small (1+) (A) 06/19/2022 1225   LEUKOCYTESUR 2+ 04/16/2013 0735    Radiological Exams on Admission: CT ABDOMEN PELVIS W CONTRAST  Result Date: 06/19/2022 CLINICAL DATA:  Left lower quadrant pain EXAM: CT ABDOMEN AND PELVIS WITH CONTRAST TECHNIQUE: Multidetector CT imaging of the abdomen and pelvis was performed using the standard protocol following bolus administration of intravenous contrast. RADIATION DOSE REDUCTION: This exam was performed according to the departmental dose-optimization program which includes automated exposure control, adjustment of the mA and/or kV according to patient size and/or use of iterative reconstruction technique. CONTRAST:  OMNIPAQUE IOHEXOL 300 MG/ML  SOLN COMPARISON:  CT abdomen and pelvis 02/06/2022 FINDINGS: Lower chest: No acute abnormality.  There is a new 4 mm left lower lobe ground-glass nodule image 4/16. Hepatobiliary: No focal liver abnormality is seen. Status post cholecystectomy. No biliary dilatation. Pancreas: Unremarkable. No pancreatic ductal dilatation or surrounding inflammatory changes. Spleen: Normal in size without focal abnormality. Adrenals/Urinary Tract: Adrenal glands are unremarkable. Kidneys are normal, without renal calculi, focal lesion, or hydronephrosis. Bladder is unremarkable. Stomach/Bowel: There is descending and sigmoid colon diverticulosis. There is wall thickening and mild inflammation of the distal descending colon most compatible with acute diverticulitis. There is a extraluminal enhancing fluid collection adjacent to the distal descending colon which is thick walled measuring 9 by 9 by 12 mm. This collection contains a small focus of air best seen on coronal image 5/28 and 2/52. There is no bowel obstruction. The appendix, small bowel and stomach are within normal limits. Vascular/Lymphatic: Aortic atherosclerosis. No enlarged abdominal or pelvic lymph nodes. Reproductive: Uterus and bilateral adnexa are unremarkable. Other: Small fat containing umbilical hernia.  No ascites. Musculoskeletal: No acute or significant osseous findings. IMPRESSION: 1. Acute diverticulitis of the distal descending colon. There is a small extraluminal enhancing fluid collection adjacent to the distal descending colon measuring 9 x 9 x 12 mm compatible with small abscess. 2. Left ground-glass pulmonary nodule measuring 4 mm. Per Fleischner Society Guidelines, no routine follow-up imaging is recommended. These guidelines do not apply to immunocompromised patients and patients with cancer. Follow up in patients with significant comorbidities as clinically warranted. For lung cancer screening, adhere to Lung-RADS guidelines. Reference: Radiology. 2017; 284(1):228-43. Aortic Atherosclerosis (ICD10-I70.0). Electronically Signed   By: Darliss Cheney M.D.   On: 06/19/2022 17:23     Data Reviewed: Relevant notes from primary care and specialist visits, past discharge summaries as available in EHR, including Care Everywhere. Prior diagnostic testing as pertinent to current admission diagnoses Updated medications and problem lists for reconciliation ED course, including vitals, labs, imaging, treatment and response to treatment Triage notes, nursing and pharmacy notes and ED provider's notes Notable results as noted in HPI   Assessment and Plan: Diverticulitis of intestine with abscess, recurrent Patient with recurrent diverticulitis showing "small extraluminal enhancing fluid collection adjacent to the distal descending colon measuring 9 x 9 x 12 mm compatible with small abscess"  Continue with metronidazole and ciprofloxacin, as patient is pen allergic Pain control as ordered Clear liquids Surgery was consulted from the ED, recommended possible IR to might be limited due to small size of abscess Will consult surgery to follow for official recommendations  History of gastroesophageal  reflux (GERD) Protonix daily  History of COPD DuoNebs as needed   DVT prophylaxis: SCD  Consults: IR and surgery  Advance Care Planning:   Code Status: Prior   Family Communication: none  Disposition Plan: Back to previous home environment  Severity of Illness: The appropriate patient status for this patient is INPATIENT. Inpatient status is judged to be reasonable and necessary in order to provide the required intensity of service to ensure the patient's safety. The patient's presenting symptoms, physical exam findings, and initial radiographic and laboratory data in the context of their chronic comorbidities is felt to place them at high risk for further clinical deterioration. Furthermore, it is not anticipated that the patient will be medically stable for discharge from the hospital within 2 midnights of admission.   * I certify  that at the point of admission it is my clinical judgment that the patient will require inpatient hospital care spanning beyond 2 midnights from the point of admission due to high intensity of service, high risk for further deterioration and high frequency of surveillance required.*  Author: Andris Baumann, MD 06/19/2022 8:48 PM  For on call review [email or web address].

## 2022-06-20 DIAGNOSIS — K5720 Diverticulitis of large intestine with perforation and abscess without bleeding: Secondary | ICD-10-CM | POA: Diagnosis not present

## 2022-06-20 MED ORDER — PANTOPRAZOLE SODIUM 40 MG PO TBEC
40.0000 mg | DELAYED_RELEASE_TABLET | Freq: Every day | ORAL | Status: DC
  Administered 2022-06-20 – 2022-06-27 (×8): 40 mg via ORAL
  Filled 2022-06-20 (×8): qty 1

## 2022-06-20 MED ORDER — FLUTICASONE FUROATE-VILANTEROL 100-25 MCG/ACT IN AEPB
1.0000 | INHALATION_SPRAY | Freq: Every day | RESPIRATORY_TRACT | Status: DC
  Administered 2022-06-21 – 2022-06-27 (×7): 1 via RESPIRATORY_TRACT
  Filled 2022-06-20 (×2): qty 28

## 2022-06-20 NOTE — ED Notes (Signed)
Transport to bedside to take pt to room

## 2022-06-20 NOTE — Progress Notes (Signed)
Progress Note   Patient: Stacy Gilmore ZOX:[id number] DOB: 01/07/1961 DOA: 06/19/2022     1 DOS: the patient was seen and examined on 06/20/2022   Brief hospital course: PRENTICE TARR is a 62 y.o. female with medical history significant for GERD asthma and COPD, hospitalized in January 2024 with acute uncomplicated diverticulitis as well as E. coli UTI, who presentsThe ED with left upper and lower quadrant pain that started several days prior.  She denies fevers or chills, nausea or vomiting, diarrhea or constipation or dysuria. ED course and data review: BP 133/98 with otherwise normal vitals.  Labs including CBC, CMP, lipase and urinalysis were all unremarkable.  CT abdomen and pelvis concerning for acute diverticulitis with possible abscess as follows.  I calling for further MPRESSION: 1. Acute diverticulitis of the distal descending colon. There is a small extraluminal enhancing fluid collection adjacent to the distal descending colon measuring 9 x 9 x 12 mm compatible with small abscess. 2. Left ground-glass pulmonary nodule measuring 4 mm. ...no routine follow-up imaging is recommended    Patient treated with morphine for pain, started on Cipro and metronidazole and hospitalist consulted for admission.   6/6: Patient is still very symptomatic in terms of pain but no fevers or diarrhea.  General surgery at this time advising IV antibiotic and conservative management and repeat imaging in 24 to 48 hours as per the status of the microabscess.  Assessment and Plan: Diverticulitis of intestine with abscess, recurrent Patient with recurrent diverticulitis( last occurrence 01/2022) showing "small extraluminal enhancing fluid collection adjacent to the distal descending colon measuring 9 x 9 x 12 mm compatible with small abscess"  Continue with metronidazole and ciprofloxacin, as patient is pen allergic Pain control as ordered, patient still has significant tenderness on palpation of the  left lower quadrant. Clear liquids General surgery at this time advising IV antibiotic and conservative management and repeat imaging in 24 to 48 hours as per the status of the microabscess.  History of gastroesophageal reflux (GERD) Protonix daily  History of COPD DuoNebs as needed        Subjective: Patient was seen and examined at bedside today.  Patient still continues to complain of left lower quadrant pain.  Denies having any active nausea or vomiting.  Physical Exam: Vitals:   06/20/22 0132 06/20/22 0601 06/20/22 0601 06/20/22 1025  BP:   134/89 139/69  Pulse:   66 84  Resp:   18 20  Temp: 97.8 F (36.6 C) (!) 97.4 F (36.3 C)  97.9 F (36.6 C)  TempSrc: Oral Oral  Oral  SpO2:   96% 97%  Weight:      Height:       Constitutional:      General: She is not in acute distress. HENT:     Head: Normocephalic and atraumatic.  Cardiovascular:     Rate and Rhythm: Normal rate and regular rhythm.     Heart sounds: Normal heart sounds.  Pulmonary:     Effort: Pulmonary effort is normal.     Breath sounds: Normal breath sounds.  Abdominal:     Palpations: Abdomen is soft.     Tenderness: There is abdominal tenderness in the left lower quadrant.  Neurological:     Mental Status: Mental status is at baseline.    Data Reviewed:  Reviewed CMP sodium potassium creatinine stable  Family Communication: Patient is alert and oriented.  Disposition: Status is: Inpatient Remains inpatient appropriate because: Acute Diverticulitis  Planned Discharge  Destination: Home    Time spent: 35 minutes  Author: Harold Hedge, MD 06/20/2022 12:37 PM  For on call review [email or web address].
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
## 2022-06-20 NOTE — Discharge Instructions (Signed)
If you are interested in getting established with a PCP. Please reach out to [facility] family medical as they are you assigned PCP on your Medicaid card. [phone]

## 2022-06-20 NOTE — TOC CM/SW Note (Addendum)
Transition of Care [facility]) - Inpatient Brief Assessment   Patient Details  Name: Stacy Gilmore MRN: [id number] Date of Birth: 1960-10-21  Transition of Care [facility] CM/SW Contact:    Chapman Fitch, RN Phone Number: 06/20/2022, 5:41 PM   Clinical Narrative: Added to AVS If you are interested in getting established with a PCP. Please reach out to [facility] family medical as they are you assigned PCP on your Medicaid card. [phone]   Transition of Care Asessment: Insurance and Status: Insurance coverage has been reviewed Patient has primary care physician: No (If you are interested in getting established with a PCP. Please reach out to [facility] family medical as they are you assigned PCP on your Medicaid card. [phone])     Prior/Current Home Services: No current home services Social Determinants of Health Reivew: SDOH reviewed no interventions necessary Readmission risk has been reviewed: Yes Transition of care needs: no transition of care needs at this time

## 2022-06-21 DIAGNOSIS — K5720 Diverticulitis of large intestine with perforation and abscess without bleeding: Secondary | ICD-10-CM | POA: Diagnosis not present

## 2022-06-21 LAB — CBC WITH DIFFERENTIAL/PLATELET
Abs Immature Granulocytes: 0.02 10*3/uL (ref 0.00–0.07)
Basophils Absolute: 0 10*3/uL (ref 0.0–0.1)
Basophils Relative: 1 %
Eosinophils Absolute: 0.1 10*3/uL (ref 0.0–0.5)
Eosinophils Relative: 2 %
HCT: 36.1 % (ref 36.0–46.0)
Hemoglobin: 11.2 g/dL — ABNORMAL LOW (ref 12.0–15.0)
Immature Granulocytes: 1 %
Lymphocytes Relative: 34 %
Lymphs Abs: 1.4 10*3/uL (ref 0.7–4.0)
MCH: 26.9 pg (ref 26.0–34.0)
MCHC: 31 g/dL (ref 30.0–36.0)
MCV: 86.6 fL (ref 80.0–100.0)
Monocytes Absolute: 0.5 10*3/uL (ref 0.1–1.0)
Monocytes Relative: 11 %
Neutro Abs: 2.2 10*3/uL (ref 1.7–7.7)
Neutrophils Relative %: 51 %
Platelets: 189 10*3/uL (ref 150–400)
RBC: 4.17 MIL/uL (ref 3.87–5.11)
RDW: 14.6 % (ref 11.5–15.5)
WBC: 4.2 10*3/uL (ref 4.0–10.5)
nRBC: 0 % (ref 0.0–0.2)

## 2022-06-21 LAB — COMPREHENSIVE METABOLIC PANEL
ALT: 64 U/L — ABNORMAL HIGH (ref 0–44)
AST: 55 U/L — ABNORMAL HIGH (ref 15–41)
Albumin: 2.9 g/dL — ABNORMAL LOW (ref 3.5–5.0)
Alkaline Phosphatase: 76 U/L (ref 38–126)
Anion gap: 5 (ref 5–15)
BUN: 6 mg/dL — ABNORMAL LOW (ref 8–23)
CO2: 30 mmol/L (ref 22–32)
Calcium: 8.4 mg/dL — ABNORMAL LOW (ref 8.9–10.3)
Chloride: 103 mmol/L (ref 98–111)
Creatinine, Ser: 0.62 mg/dL (ref 0.44–1.00)
GFR, Estimated: >60 mL/min (ref >60)
Glucose, Bld: 96 mg/dL (ref 70–99)
Potassium: 3.9 mmol/L (ref 3.5–5.1)
Sodium: 138 mmol/L (ref 135–145)
Total Bilirubin: 0.5 mg/dL (ref 0.3–1.2)
Total Protein: 5.6 g/dL — ABNORMAL LOW (ref 6.5–8.1)

## 2022-06-21 MED ORDER — ENOXAPARIN SODIUM 40 MG/0.4ML IJ SOSY
40.0000 mg | PREFILLED_SYRINGE | Freq: Every day | INTRAMUSCULAR | Status: DC
  Administered 2022-06-21 – 2022-06-26 (×6): 40 mg via SUBCUTANEOUS
  Filled 2022-06-21 (×6): qty 0.4

## 2022-06-21 NOTE — Progress Notes (Signed)
[facility] SURGICAL PROGRESS NOTE (cpt [phone])  Hospital Day(s): 2.   Interval History: Patient seen and examined, no acute events or new complaints overnight. Patient reports she continues to have LLQ abdominal pain; she reports similar in nature. Up sitting at the side of the bed this morning. Also with nausea and an episode of emesis this AM. Remains without leukocytosis; 4.2K. Hgb to 11.2. Renal function normal; sCr - 0.62; UO - unmeasured. No electrolyte derangements. She continues on Cipro & Flagyl. She is on CLD; tolerated  Review of Systems:  Constitutional: denies fever, chills  HEENT: denies cough or congestion  Respiratory: denies any shortness of breath  Cardiovascular: denies chest pain or palpitations  Gastrointestinal: + Abdominal pain; + Nausea, + Emesis  Genitourinary: denies burning with urination or urinary frequency Musculoskeletal: denies pain, decreased motor or sensation  Vital signs in last 24 hours: [min-max] current  Temp:  [97.6 F (36.4 C)-97.9 F (36.6 C)] 97.6 F (36.4 C) (06/07 0352) Pulse Rate:  [55-84] 55 (06/07 0352) Resp:  [18-20] 18 (06/07 0352) BP: (115-139)/(58-81) 119/73 (06/07 0352) SpO2:  [95 %-98 %] 98 % (06/07 0352)     Height: 5\' 6"  (167.6 cm) Weight: 67.4 kg BMI (Calculated): 23.99   Intake/Output last 2 shifts:  06/06 0701 - 06/07 0700 In: 1480 [P.O.:1080; I.V.:300; IV Piggyback:100] Out: -    Physical Exam:  Constitutional: alert, cooperative. Patient initially up at side of the bed; able to lay down without assistance  HENT: normocephalic without obvious abnormality  Eyes: PERRL, EOM's grossly intact and symmetric  Respiratory: breathing non-labored at rest  Cardiovascular: regular rate and sinus rhythm  Gastrointestinal: Abdomen is soft, she is tender in suprapubic and LLQ; no worse than yesterday, non-distended. She does not appear to have rebound tenderness.  Musculoskeletal: no edema or wounds, motor and  sensation grossly intact, NT    Labs:     Latest Ref Rng & Units 06/21/2022    4:40 AM 06/19/2022    1:16 PM 02/07/2022    5:32 AM  CBC  WBC 4.0 - 10.5 K/uL 4.2  8.6  7.4   Hemoglobin 12.0 - 15.0 g/dL 91.4  78.2  95.6   Hematocrit 36.0 - 46.0 % 36.1  43.9  32.9   Platelets 150 - 400 K/uL 189  285  198       Latest Ref Rng & Units 06/21/2022    4:40 AM 06/19/2022    1:16 PM 02/09/2022    4:22 AM  CMP  Glucose 70 - 99 mg/dL 96  99  213   BUN 8 - 23 mg/dL 6  11  <5   Creatinine 0.44 - 1.00 mg/dL 0.86  5.78  4.69   Sodium 135 - 145 mmol/L 138  136  142   Potassium 3.5 - 5.1 mmol/L 3.9  3.9  4.1   Chloride 98 - 111 mmol/L 103  100  105   CO2 22 - 32 mmol/L 30  25  28    Calcium 8.9 - 10.3 mg/dL 8.4  9.0  8.9   Total Protein 6.5 - 8.1 g/dL 5.6  7.6    Total Bilirubin 0.3 - 1.2 mg/dL 0.5  0.8    Alkaline Phos 38 - 126 U/L 76  75    AST 15 - 41 U/L 55  35    ALT 0 - 44 U/L 64  40       Imaging studies: No new pertinent imaging studies   Assessment/Plan: (ICD-10's:  K55.92) 62 y.o. female with diverticulitis with small (<1 cm) abscess           - Given continued pain and N/V this AM, I will back down to NPO  - I do not think she warrants any emergent surgery currently; however, she is quite tender on examination although her baseline mentation makes this difficult to read. Will follow closely. Low threshold to repeat imaging. She understands that our goal is to avoid surgery and temporizing colostomy, but if she fails to improve or clinically worsens, may need to pursue this more urgently.              - Continue IV Abx (Cipro + Flagyl) - Monitor abdominal examination; on-going bowel function - Pain control prn; antiemetics prn              - Mobilize; as tolerated  - Morning labs              - Further management per primary service; we will follow   All of the above findings and recommendations were discussed with the patient, and the medical team, and all of patient's questions were  answered to her expressed satisfaction.  -- Lynden Oxford, PA-C [facility] 06/21/2022, 6:04 AM M-F: 7am - 4pm

## 2022-06-21 NOTE — Progress Notes (Signed)
Progress Note   Patient: Stacy Gilmore:[id number] DOB: Apr 03, 1960 DOA: 06/19/2022     2 DOS: the patient was seen and examined on 06/21/2022   Brief hospital course: AYLYN TARAZONA is a 62 y.o. female with medical history significant for GERD asthma and COPD, hospitalized in January 2024 with acute uncomplicated diverticulitis as well as E. coli UTI, who presentsThe ED with left upper and lower quadrant pain that started several days prior.  She denies fevers or chills, nausea or vomiting, diarrhea or constipation or dysuria. ED course and data review: BP 133/98 with otherwise normal vitals.  Labs including CBC, CMP, lipase and urinalysis were all unremarkable.  CT abdomen and pelvis concerning for acute diverticulitis with possible abscess as follows.  I calling for further MPRESSION: 1. Acute diverticulitis of the distal descending colon. There is a small extraluminal enhancing fluid collection adjacent to the distal descending colon measuring 9 x 9 x 12 mm compatible with small abscess. 2. Left ground-glass pulmonary nodule measuring 4 mm. ...no routine follow-up imaging is recommended    Patient treated with morphine for pain, started on Cipro and metronidazole and hospitalist consulted for admission.   6/6: Patient is still very symptomatic in terms of pain but no fevers or diarrhea.  General surgery at this time advising IV antibiotic and conservative management and repeat imaging in 24 to 48 hours as per the status of the microabscess.  6/7: mild improvement, has nausea/ileus, remains npo  Assessment and Plan: Diverticulitis of intestine with abscess, recurrent Partial ileus Patient with recurrent diverticulitis( third occurrence, first in 2015, next in 01/2022) showing "small extraluminal enhancing fluid collection adjacent to the distal descending colon measuring 9 x 9 x 12 mm compatible with small abscess"  Continue with metronidazole and ciprofloxacin, as patient is pen  allergic Pain control as ordered, patient still has significant tenderness on palpation of the left lower quadrant. General surgery at this time advising IV antibiotic and conservative management Npo for ileus Has colonoscopy scheduled for next month will likely need to be re-scheduled  History of gastroesophageal reflux (GERD) Protonix daily  History of COPD DuoNebs as needed        Subjective: Patient was seen and examined at bedside today.  Patient still continues to complain of left lower quadrant pain.  Nausea but no vomiting. No stool but is passing flatus. Says pain somewhat improved  Physical Exam: Vitals:   06/20/22 1533 06/20/22 2030 06/21/22 0352 06/21/22 0757  BP: 122/81 (!) 115/58 119/73 111/81  Pulse: (!) 57 (!) 56 (!) 55 (!) 56  Resp: 18 18 18 18   Temp: 97.9 F (36.6 C) 97.7 F (36.5 C) 97.6 F (36.4 C) 97.6 F (36.4 C)  TempSrc: Oral Oral Oral Oral  SpO2: 97% 95% 98% 96%  Weight:      Height:       Constitutional:      General: She is not in acute distress. HENT:     Head: Normocephalic and atraumatic.  Cardiovascular:     Rate and Rhythm: Normal rate and regular rhythm.     Heart sounds: Normal heart sounds.  Pulmonary:     Effort: Pulmonary effort is normal.     Breath sounds: Normal breath sounds.  Abdominal:     Palpations: Abdomen is soft.     Tenderness: There is abdominal tenderness in the left lower quadrant.  Neurological:     Mental Status: Mental status is at baseline.    Data Reviewed:  Reviewed  CMP sodium potassium creatinine stable  Family Communication: Patient is alert and oriented.  Disposition: Status is: Inpatient Remains inpatient appropriate because: Acute Diverticulitis  Planned Discharge Destination: Home    Time spent: 35 minutes  Author: Silvano Bilis, MD 06/21/2022 2:42 PM  For on call review [email or web address].

## 2022-06-22 ENCOUNTER — Inpatient Hospital Stay: Payer: Medicaid Other

## 2022-06-22 DIAGNOSIS — K567 Ileus, unspecified: Secondary | ICD-10-CM

## 2022-06-22 DIAGNOSIS — K219 Gastro-esophageal reflux disease without esophagitis: Secondary | ICD-10-CM

## 2022-06-22 DIAGNOSIS — K5792 Diverticulitis of intestine, part unspecified, without perforation or abscess without bleeding: Secondary | ICD-10-CM | POA: Diagnosis not present

## 2022-06-22 LAB — CBC
HCT: 35.1 % — ABNORMAL LOW (ref 36.0–46.0)
Hemoglobin: 10.9 g/dL — ABNORMAL LOW (ref 12.0–15.0)
MCH: 26.7 pg (ref 26.0–34.0)
MCHC: 31.1 g/dL (ref 30.0–36.0)
MCV: 86 fL (ref 80.0–100.0)
Platelets: 191 10*3/uL (ref 150–400)
RBC: 4.08 MIL/uL (ref 3.87–5.11)
RDW: 14.2 % (ref 11.5–15.5)
WBC: 3.8 10*3/uL — ABNORMAL LOW (ref 4.0–10.5)
nRBC: 0 % (ref 0.0–0.2)

## 2022-06-22 LAB — BASIC METABOLIC PANEL
Anion gap: 7 (ref 5–15)
BUN: 6 mg/dL — ABNORMAL LOW (ref 8–23)
CO2: 30 mmol/L (ref 22–32)
Calcium: 8.5 mg/dL — ABNORMAL LOW (ref 8.9–10.3)
Chloride: 102 mmol/L (ref 98–111)
Creatinine, Ser: 0.67 mg/dL (ref 0.44–1.00)
GFR, Estimated: >60 mL/min (ref >60)
Glucose, Bld: 96 mg/dL (ref 70–99)
Potassium: 4.4 mmol/L (ref 3.5–5.1)
Sodium: 139 mmol/L (ref 135–145)

## 2022-06-22 NOTE — Progress Notes (Signed)
Patient ID: Stacy Gilmore, female   DOB: 01-May-1960, 62 y.o.   MRN: [id number]     SURGICAL PROGRESS NOTE   Hospital Day(s): 3.   Interval History: Patient seen and examined, no acute events or new complaints overnight. Patient reports the same.  She endorses that the pain has not improved since admission.  Pain localized to the left lower quadrant almost at the groin.  No pain radiation.  Pain aggravated by applying pressure.  No alleviating factors.  She denies any fever.  Vital signs in last 24 hours: [min-max] current  Temp:  [97.6 F (36.4 C)-97.9 F (36.6 C)] 97.6 F (36.4 C) (06/08 0808) Pulse Rate:  [50-53] 51 (06/08 0808) Resp:  [16-18] 18 (06/08 0808) BP: (114-131)/(66-93) 118/66 (06/08 0808) SpO2:  [91 %-97 %] 97 % (06/08 0808)     Height: 5\' 6"  (167.6 cm) Weight: 67.4 kg BMI (Calculated): 23.99   Physical Exam:  Constitutional: alert, cooperative and no distress  Respiratory: breathing non-labored at rest  Cardiovascular: regular rate and sinus rhythm  Gastrointestinal: soft, tender in the left lower quadrant, and non-distended  Labs:     Latest Ref Rng & Units 06/22/2022    4:16 AM 06/21/2022    4:40 AM 06/19/2022    1:16 PM  CBC  WBC 4.0 - 10.5 K/uL 3.8  4.2  8.6   Hemoglobin 12.0 - 15.0 g/dL 81.1  91.4  78.2   Hematocrit 36.0 - 46.0 % 35.1  36.1  43.9   Platelets 150 - 400 K/uL 191  189  285       Latest Ref Rng & Units 06/22/2022    4:16 AM 06/21/2022    4:40 AM 06/19/2022    1:16 PM  CMP  Glucose 70 - 99 mg/dL 96  96  99   BUN 8 - 23 mg/dL 6  6  11    Creatinine 0.44 - 1.00 mg/dL 9.56  2.13  0.86   Sodium 135 - 145 mmol/L 139  138  136   Potassium 3.5 - 5.1 mmol/L 4.4  3.9  3.9   Chloride 98 - 111 mmol/L 102  103  100   CO2 22 - 32 mmol/L 30  30  25    Calcium 8.9 - 10.3 mg/dL 8.5  8.4  9.0   Total Protein 6.5 - 8.1 g/dL  5.6  7.6   Total Bilirubin 0.3 - 1.2 mg/dL  0.5  0.8   Alkaline Phos 38 - 126 U/L  76  75   AST 15 - 41 U/L  55  35   ALT 0 - 44 U/L  64   40     Imaging studies: No new pertinent imaging studies   Assessment/Plan:  62 y.o. female with acute diverticulitis, complicated by pertinent comorbidities including COPD.  Acute diverticulitis with small abscess -Current recommendation to continue treatment with IV antibiotic therapy, bowel rest -No significant improvement in clinical status as per patient, no improvement of pain -Will continue n.p.o., continue antibiotic therapy -Today have a very discussion with the patient about possible surgical intervention if the pain does not improve.  Discussed with patient that if she fell to improve with medical management she will need a partial colectomy with colostomy creation. -Patient refers she would like to continue with medical management as possible.  Gae Gallop, MD

## 2022-06-22 NOTE — Progress Notes (Addendum)
PROGRESS NOTE    Stacy Gilmore  ZOX:[id number] DOB: 05/17/1960 DOA: 06/19/2022 PCP: No primary care provider on file.    Assessment & Plan:   Principal Problem:   Acute diverticulitis Active Problems:   History of COPD   History of gastroesophageal reflux (GERD)   Cocaine use   Diverticulitis of intestine with abscess, recurrent  Assessment and Plan: Diverticulitis: w/ abscess & recurrent. Continue on flagyl, cipro. Last episode of diverticulitis was 01/2022.   GERD: continue on PPI    COPD: w/o exacerbation. Bronchodilators prn        DVT prophylaxis: lovenox  Code Status: full  Family Communication Disposition Plan:likely d/c back home   Level of care: Med-Surg  Status is: Inpatient Remains inpatient appropriate because: severity of illness   Consultants:  Gen surg   Procedures:   Antimicrobials: flagyl, cipro    Subjective: Pt c/o being hungry & abd pain   Objective: Vitals:   06/21/22 1932 06/21/22 1933 06/22/22 0403 06/22/22 0808  BP: (!) 131/93  114/67 118/66  Pulse: (!) 50 (!) 53 (!) 50 (!) 51  Resp: 16  16 18   Temp: 97.7 F (36.5 C)  97.7 F (36.5 C) 97.6 F (36.4 C)  TempSrc: Oral  Oral   SpO2: 93% 93% 93% 97%  Weight:      Height:        Intake/Output Summary (Last 24 hours) at 06/22/2022 0837 Last data filed at 06/21/2022 2234 Gross per 24 hour  Intake 2151.89 ml  Output --  Net 2151.89 ml   Filed Weights   06/19/22 1314  Weight: 67.4 kg    Examination:  General exam: Appears calm and comfortable  Respiratory system: Clear to auscultation. Respiratory effort normal. Cardiovascular system: S1 & S2+. No rubs, gallops or clicks.  Gastrointestinal system: Abdomen is nondistended, soft and tenderness to palpation. Hypoactive bowel sounds heard. Central nervous system: Alert and oriented. Moves all extremities Psychiatry: Judgement and insight appear normal. Mood & affect appropriate.     Data Reviewed: I have personally  reviewed following labs and imaging studies  CBC: Recent Labs  Lab 06/19/22 1316 06/21/22 0440 06/22/22 0416  WBC 8.6 4.2 3.8*  NEUTROABS  --  2.2  --   HGB 14.1 11.2* 10.9*  HCT 43.9 36.1 35.1*  MCV 84.4 86.6 86.0  PLT 285 189 191   Basic Metabolic Panel: Recent Labs  Lab 06/19/22 1316 06/21/22 0440 06/22/22 0416  NA 136 138 139  K 3.9 3.9 4.4  CL 100 103 102  CO2 25 30 30   GLUCOSE 99 96 96  BUN 11 6* 6*  CREATININE 0.54 0.62 0.67  CALCIUM 9.0 8.4* 8.5*   GFR: Estimated Creatinine Clearance: 69.1 mL/min (by C-G formula based on SCr of 0.67 mg/dL). Liver Function Tests: Recent Labs  Lab 06/19/22 1316 06/21/22 0440  AST 35 55*  ALT 40 64*  ALKPHOS 75 76  BILITOT 0.8 0.5  PROT 7.6 5.6*  ALBUMIN 4.2 2.9*   Recent Labs  Lab 06/19/22 1316  LIPASE 46   No results for input(s): "AMMONIA" in the last 168 hours. Coagulation Profile: No results for input(s): "INR", "PROTIME" in the last 168 hours. Cardiac Enzymes: No results for input(s): "CKTOTAL", "CKMB", "CKMBINDEX", "TROPONINI" in the last 168 hours. BNP (last 3 results) No results for input(s): "PROBNP" in the last 8760 hours. HbA1C: No results for input(s): "HGBA1C" in the last 72 hours. CBG: No results for input(s): "GLUCAP" in the last 168 hours. Lipid Profile:  No results for input(s): "CHOL", "HDL", "LDLCALC", "TRIG", "CHOLHDL", "LDLDIRECT" in the last 72 hours. Thyroid Function Tests: No results for input(s): "TSH", "T4TOTAL", "FREET4", "T3FREE", "THYROIDAB" in the last 72 hours. Anemia Panel: No results for input(s): "VITAMINB12", "FOLATE", "FERRITIN", "TIBC", "IRON", "RETICCTPCT" in the last 72 hours. Sepsis Labs: No results for input(s): "PROCALCITON", "LATICACIDVEN" in the last 168 hours.  No results found for this or any previous visit (from the past 240 hour(s)).       Radiology Studies: No results found.      Scheduled Meds:  enoxaparin (LOVENOX) injection  40 mg Subcutaneous  QHS   fluticasone furoate-vilanterol  1 puff Inhalation Daily   pantoprazole  40 mg Oral Daily   Continuous Infusions:  ciprofloxacin 400 mg (06/22/22 0527)   lactated ringers 100 mL/hr at 06/21/22 2234   metronidazole 500 mg (06/22/22 0628)     LOS: 3 days    Time spent: 25 mins     Charise Killian, MD [facility] Pager [phone]  If 7PM-7AM, please contact night-coverage [email or web address] 06/22/2022, 8:37 AM

## 2022-06-23 DIAGNOSIS — K219 Gastro-esophageal reflux disease without esophagitis: Secondary | ICD-10-CM | POA: Diagnosis not present

## 2022-06-23 DIAGNOSIS — K567 Ileus, unspecified: Secondary | ICD-10-CM | POA: Diagnosis not present

## 2022-06-23 DIAGNOSIS — K5792 Diverticulitis of intestine, part unspecified, without perforation or abscess without bleeding: Secondary | ICD-10-CM | POA: Diagnosis not present

## 2022-06-23 LAB — CBC
HCT: 39.3 % (ref 36.0–46.0)
Hemoglobin: 12 g/dL (ref 12.0–15.0)
MCH: 26.5 pg (ref 26.0–34.0)
MCHC: 30.5 g/dL (ref 30.0–36.0)
MCV: 86.8 fL (ref 80.0–100.0)
Platelets: 185 10*3/uL (ref 150–400)
RBC: 4.53 MIL/uL (ref 3.87–5.11)
RDW: 13.8 % (ref 11.5–15.5)
WBC: 5.2 10*3/uL (ref 4.0–10.5)
nRBC: 0 % (ref 0.0–0.2)

## 2022-06-23 LAB — BASIC METABOLIC PANEL
Anion gap: 8 (ref 5–15)
BUN: 6 mg/dL — ABNORMAL LOW (ref 8–23)
CO2: 24 mmol/L (ref 22–32)
Calcium: 8.1 mg/dL — ABNORMAL LOW (ref 8.9–10.3)
Chloride: 103 mmol/L (ref 98–111)
Creatinine, Ser: 0.69 mg/dL (ref 0.44–1.00)
GFR, Estimated: >60 mL/min (ref >60)
Glucose, Bld: 75 mg/dL (ref 70–99)
Potassium: 4.2 mmol/L (ref 3.5–5.1)
Sodium: 135 mmol/L (ref 135–145)

## 2022-06-23 MED ORDER — PROCHLORPERAZINE EDISYLATE 10 MG/2ML IJ SOLN
10.0000 mg | Freq: Once | INTRAMUSCULAR | Status: AC
  Administered 2022-06-23: 10 mg via INTRAVENOUS
  Filled 2022-06-23: qty 2

## 2022-06-23 NOTE — Progress Notes (Signed)
Patient ID: Stacy Gilmore, female   DOB: 07/18/1960, 62 y.o.   MRN: [id number]     SURGICAL PROGRESS NOTE   Hospital Day(s): 4.   Interval History: Patient seen and examined, no acute events or new complaints overnight. Patient reports feeling better now that she has a pain medication in the system.  She endorses that she had a bad night.  But this morning she feels better.  She denies any nausea or vomiting..  Vital signs in last 24 hours: [min-max] current  Temp:  [97.5 F (36.4 C)-98.1 F (36.7 C)] 97.9 F (36.6 C) (06/09 0837) Pulse Rate:  [55-63] 62 (06/09 0837) Resp:  [15-20] 18 (06/09 0837) BP: (124-133)/(66-83) 124/66 (06/09 0837) SpO2:  [91 %-97 %] 94 % (06/09 0837)     Height: 5\' 6"  (167.6 cm) Weight: 67.4 kg BMI (Calculated): 23.99   Physical Exam:  Constitutional: alert, cooperative and no distress  Respiratory: breathing non-labored at rest  Cardiovascular: regular rate and sinus rhythm  Gastrointestinal: soft, mild-tender in the left lower quadrant, and non-distended  Labs:     Latest Ref Rng & Units 06/23/2022    4:29 AM 06/22/2022    4:16 AM 06/21/2022    4:40 AM  CBC  WBC 4.0 - 10.5 K/uL 5.2  3.8  4.2   Hemoglobin 12.0 - 15.0 g/dL 78.2  95.6  21.3   Hematocrit 36.0 - 46.0 % 39.3  35.1  36.1   Platelets 150 - 400 K/uL 185  191  189       Latest Ref Rng & Units 06/23/2022    4:29 AM 06/22/2022    4:16 AM 06/21/2022    4:40 AM  CMP  Glucose 70 - 99 mg/dL 75  96  96   BUN 8 - 23 mg/dL 6  6  6    Creatinine 0.44 - 1.00 mg/dL 0.86  5.78  4.69   Sodium 135 - 145 mmol/L 135  139  138   Potassium 3.5 - 5.1 mmol/L 4.2  4.4  3.9   Chloride 98 - 111 mmol/L 103  102  103   CO2 22 - 32 mmol/L 24  30  30    Calcium 8.9 - 10.3 mg/dL 8.1  8.5  8.4   Total Protein 6.5 - 8.1 g/dL   5.6   Total Bilirubin 0.3 - 1.2 mg/dL   0.5   Alkaline Phos 38 - 126 U/L   76   AST 15 - 41 U/L   55   ALT 0 - 44 U/L   64     Imaging studies: No new pertinent imaging  studies   Assessment/Plan:  62 y.o. female with acute diverticulitis, complicated by pertinent comorbidities including COPD.   Acute diverticulitis with small abscess -No clinical deterioration.  I think that there has been some mild improvement with IV antibiotic therapy. -I will start clear liquid diet today since the pain is improved this morning -I again discussed with her that if she continued having recurrent significant pain and does not improve with IV therapy she will need a colostomy bag.  Patient would like to avoid any colostomy.  Gae Gallop, MD

## 2022-06-23 NOTE — Progress Notes (Signed)
PROGRESS NOTE   HPI was taken from Dr. Para March: Stacy Gilmore is a 62 y.o. female with medical history significant for GERD asthma and COPD, hospitalized in January 2024 with acute uncomplicated diverticulitis as well as E. coli UTI, who presentsThe ED with left upper and lower quadrant pain that started several days prior.  She denies fevers or chills, nausea or vomiting, diarrhea or constipation or dysuria. ED course and data review: BP 133/98 with otherwise normal vitals.  Labs including CBC, CMP, lipase and urinalysis were all unremarkable.  CT abdomen and pelvis concerning for acute diverticulitis with possible abscess as follows.  I calling for further MPRESSION: 1. Acute diverticulitis of the distal descending colon. There is a small extraluminal enhancing fluid collection adjacent to the distal descending colon measuring 9 x 9 x 12 mm compatible with small abscess. 2. Left ground-glass pulmonary nodule measuring 4 mm. ...no routine follow-up imaging is recommended    Patient treated with morphine for pain, started on Cipro and metronidazole and hospitalist consulted for admission.      Stacy Gilmore  GNF:[id number] DOB: 1960-11-05 DOA: 06/19/2022 PCP: No primary care provider on file.    Assessment & Plan:   Principal Problem:   Acute diverticulitis Active Problems:   History of COPD   History of gastroesophageal reflux (GERD)   Cocaine use   Diverticulitis of intestine with abscess, recurrent  Assessment and Plan: Diverticulitis: w/ abscess & recurrent. Continue on cipro, flagyl. Last episode of diverticulitis was 01/2022. Pt had significant pain w/ starting clear liquids. Zofran prn. Morphine prn. Will likely need a colostomy bag as per gen surg. Gen surg recs apprec.  GERD: continue on PPI    COPD: w/o exacerbation. Bronchodilators prn        DVT prophylaxis: lovenox  Code Status: full  Family Communication Disposition Plan:likely d/c back home   Level  of care: Med-Surg  Status is: Inpatient Remains inpatient appropriate because: severity of illness, still w/ significant abd pain, will likely need colostomy bag as per gen surg    Consultants:  Gen surg   Procedures:   Antimicrobials: flagyl, cipro    Subjective: Pt c/o abd pain & nausea   Objective: Vitals:   06/22/22 0808 06/22/22 1515 06/22/22 1950 06/23/22 0434  BP: 118/66 128/66 130/70 133/83  Pulse: (!) 51 63 60 (!) 55  Resp: 18 15 16 20   Temp: 97.6 F (36.4 C) 98.1 F (36.7 C) 97.9 F (36.6 C) (!) 97.5 F (36.4 C)  TempSrc:  Oral Oral Oral  SpO2: 97% 91% 96% 97%  Weight:      Height:        Intake/Output Summary (Last 24 hours) at 06/23/2022 0829 Last data filed at 06/23/2022 0523 Gross per 24 hour  Intake 2336.35 ml  Output 0 ml  Net 2336.35 ml   Filed Weights   06/19/22 1314  Weight: 67.4 kg    Examination:  General exam: Appears uncomfortable  Respiratory system: clear breath sounds b/l  Cardiovascular system: S1/S2+. No rubs or gallops  Gastrointestinal system: abd is soft, tenderness to palpation & hypoactive bowel sounds  Central nervous system: alert & oriented. Moves all extremities  Psychiatry: Judgement and insight appears normal. Appropriate mood and affect.     Data Reviewed: I have personally reviewed following labs and imaging studies  CBC: Recent Labs  Lab 06/19/22 1316 06/21/22 0440 06/22/22 0416 06/23/22 0429  WBC 8.6 4.2 3.8* 5.2  NEUTROABS  --  2.2  --   --  HGB 14.1 11.2* 10.9* 12.0  HCT 43.9 36.1 35.1* 39.3  MCV 84.4 86.6 86.0 86.8  PLT 285 189 191 185   Basic Metabolic Panel: Recent Labs  Lab 06/19/22 1316 06/21/22 0440 06/22/22 0416 06/23/22 0429  NA 136 138 139 135  K 3.9 3.9 4.4 4.2  CL 100 103 102 103  CO2 25 30 30 24   GLUCOSE 99 96 96 75  BUN 11 6* 6* 6*  CREATININE 0.54 0.62 0.67 0.69  CALCIUM 9.0 8.4* 8.5* 8.1*   GFR: Estimated Creatinine Clearance: 69.1 mL/min (by C-G formula based on SCr of  0.69 mg/dL). Liver Function Tests: Recent Labs  Lab 06/19/22 1316 06/21/22 0440  AST 35 55*  ALT 40 64*  ALKPHOS 75 76  BILITOT 0.8 0.5  PROT 7.6 5.6*  ALBUMIN 4.2 2.9*   Recent Labs  Lab 06/19/22 1316  LIPASE 46   No results for input(s): "AMMONIA" in the last 168 hours. Coagulation Profile: No results for input(s): "INR", "PROTIME" in the last 168 hours. Cardiac Enzymes: No results for input(s): "CKTOTAL", "CKMB", "CKMBINDEX", "TROPONINI" in the last 168 hours. BNP (last 3 results) No results for input(s): "PROBNP" in the last 8760 hours. HbA1C: No results for input(s): "HGBA1C" in the last 72 hours. CBG: No results for input(s): "GLUCAP" in the last 168 hours. Lipid Profile: No results for input(s): "CHOL", "HDL", "LDLCALC", "TRIG", "CHOLHDL", "LDLDIRECT" in the last 72 hours. Thyroid Function Tests: No results for input(s): "TSH", "T4TOTAL", "FREET4", "T3FREE", "THYROIDAB" in the last 72 hours. Anemia Panel: No results for input(s): "VITAMINB12", "FOLATE", "FERRITIN", "TIBC", "IRON", "RETICCTPCT" in the last 72 hours. Sepsis Labs: No results for input(s): "PROCALCITON", "LATICACIDVEN" in the last 168 hours.  No results found for this or any previous visit (from the past 240 hour(s)).       Radiology Studies: DG ABD ACUTE 2+V W 1V CHEST  Result Date: 06/22/2022 CLINICAL DATA:  Ileus.  Left inguinal pain. EXAM: DG ABDOMEN ACUTE WITH 1 VIEW CHEST COMPARISON:  CT abdomen/pelvis 06/19/2022. FINDINGS: Chest: The cardiomediastinal silhouette is normal. There is no focal consolidation or pulmonary edema. There is no pleural effusion or pneumothorax. Abdomen: There is a nonobstructive bowel gas pattern. There is no free intraperitoneal air. There is no gross organomegaly or abnormal soft tissue calcification. Right upper quadrant surgical clips are noted. There is no acute osseous abnormality. Cervical spine fusion hardware is partially imaged. IMPRESSION: No acute finding in  the chest or abdomen. Electronically Signed   By: Lesia Hausen M.D.   On: 06/22/2022 11:06        Scheduled Meds:  enoxaparin (LOVENOX) injection  40 mg Subcutaneous QHS   fluticasone furoate-vilanterol  1 puff Inhalation Daily   pantoprazole  40 mg Oral Daily   prochlorperazine  10 mg Intravenous Once   Continuous Infusions:  ciprofloxacin 400 mg (06/23/22 0518)   lactated ringers 100 mL/hr at 06/23/22 0045   metronidazole 500 mg (06/23/22 0636)     LOS: 4 days    Time spent: 25 mins     Charise Killian, MD [facility] Pager [phone]  If 7PM-7AM, please contact night-coverage [email or web address] 06/23/2022, 8:29 AM

## 2022-06-24 ENCOUNTER — Other Ambulatory Visit: Payer: Self-pay

## 2022-06-24 ENCOUNTER — Inpatient Hospital Stay: Payer: Medicaid Other

## 2022-06-24 ENCOUNTER — Encounter: Payer: Self-pay | Admitting: Internal Medicine

## 2022-06-24 DIAGNOSIS — K5720 Diverticulitis of large intestine with perforation and abscess without bleeding: Secondary | ICD-10-CM | POA: Diagnosis not present

## 2022-06-24 DIAGNOSIS — E440 Moderate protein-calorie malnutrition: Secondary | ICD-10-CM | POA: Insufficient documentation

## 2022-06-24 LAB — BASIC METABOLIC PANEL
Anion gap: 9 (ref 5–15)
BUN: 6 mg/dL — ABNORMAL LOW (ref 8–23)
CO2: 26 mmol/L (ref 22–32)
Calcium: 7.9 mg/dL — ABNORMAL LOW (ref 8.9–10.3)
Chloride: 102 mmol/L (ref 98–111)
Creatinine, Ser: 0.55 mg/dL (ref 0.44–1.00)
GFR, Estimated: >60 mL/min (ref >60)
Glucose, Bld: 106 mg/dL — ABNORMAL HIGH (ref 70–99)
Potassium: 3.6 mmol/L (ref 3.5–5.1)
Sodium: 137 mmol/L (ref 135–145)

## 2022-06-24 LAB — CBC
HCT: 35.5 % — ABNORMAL LOW (ref 36.0–46.0)
Hemoglobin: 11.3 g/dL — ABNORMAL LOW (ref 12.0–15.0)
MCH: 26.9 pg (ref 26.0–34.0)
MCHC: 31.8 g/dL (ref 30.0–36.0)
MCV: 84.5 fL (ref 80.0–100.0)
Platelets: 197 10*3/uL (ref 150–400)
RBC: 4.2 MIL/uL (ref 3.87–5.11)
RDW: 13.9 % (ref 11.5–15.5)
WBC: 5 10*3/uL (ref 4.0–10.5)
nRBC: 0 % (ref 0.0–0.2)

## 2022-06-24 MED ORDER — IOHEXOL 9 MG/ML PO SOLN
500.0000 mL | ORAL | Status: AC
  Administered 2022-06-24 (×2): 500 mL via ORAL

## 2022-06-24 MED ORDER — BOOST / RESOURCE BREEZE PO LIQD CUSTOM
1.0000 | Freq: Three times a day (TID) | ORAL | Status: DC
  Administered 2022-06-24 – 2022-06-25 (×3): 1 via ORAL

## 2022-06-24 MED ORDER — ADULT MULTIVITAMIN W/MINERALS CH
1.0000 | ORAL_TABLET | Freq: Every day | ORAL | Status: DC
  Administered 2022-06-25 – 2022-06-27 (×3): 1 via ORAL
  Filled 2022-06-24 (×3): qty 1

## 2022-06-24 MED ORDER — IOHEXOL 300 MG/ML  SOLN
100.0000 mL | Freq: Once | INTRAMUSCULAR | Status: AC | PRN
  Administered 2022-06-24: 100 mL via INTRAVENOUS

## 2022-06-24 NOTE — Progress Notes (Signed)
Initial Nutrition Assessment  DOCUMENTATION CODES:   Non-severe (moderate) malnutrition in context of chronic illness  INTERVENTION:   Recommend TPN if unable to advance pt's diet in the next 24 hrs or if surgery is anticipated.   Boost Breeze po TID, each supplement provides 250 kcal and 9 grams of protein  MVI po daily   Pt at high refeed risk; recommend monitor potassium, magnesium and phosphorus labs daily until stable  Daily weights   Diverticulitis diet education   NUTRITION DIAGNOSIS:   Moderate Malnutrition related to chronic illness as evidenced by mild fat depletion, mild muscle depletion, percent weight loss.  GOAL:   Patient will meet greater than or equal to 90% of their needs  MONITOR:   PO intake, Supplement acceptance, Diet advancement, Labs, Weight trends, I & O's, Skin  REASON FOR ASSESSMENT:   NPO/Clear Liquid Diet    ASSESSMENT:   62 y/o female with h/o COPD, GERD and substance abuse who is admitted with acute diverticulitis.  Met with pt in room today. Pt in good spirits, laying in bed and making jokes. Pt reports good appetite and oral intake at baseline. Pt reports that she is hungry today and wants to eat. Pt reports eating 100% of her clear liquid diet. Per chart, pt appears to be down 26lbs(15%) over the past 6-7 months; this is significant weight loss. Pt does endorse weight loss pta. It appears pt has had several admissions for diverticulitis since January. Pt reports that she does drink Ensure supplements (prefers chocolate). Pt has remained on NPO/clear liquid diet since admission and is now without adequate nutrition for 6 days. Pt s/p repeat CT scan today; results pending. RD will add supplements and MVI to help pt meet her estimated needs. Pt is at high refeed risk. Would recommend TPN if unable to advance pt's diet within the next 24 hrs or if surgery is anticipated. Pt provided with diverticulitis diet education today.    Medications  reviewed and include: lovenox, protonix, ciprofloxacin, LRS @100ml /hr, metronidazole   Labs reviewed: K 3.6 wnl, BUN 6(L)  NUTRITION - FOCUSED PHYSICAL EXAM:  Flowsheet Row Most Recent Value  Orbital Region No depletion  Upper Arm Region Moderate depletion  Thoracic and Lumbar Region Mild depletion  Buccal Region No depletion  Temple Region No depletion  Clavicle Bone Region Mild depletion  Clavicle and Acromion Bone Region Mild depletion  Scapular Bone Region No depletion  Dorsal Hand Mild depletion  Patellar Region Mild depletion  Anterior Thigh Region Mild depletion  Posterior Calf Region Mild depletion  Edema (RD Assessment) None  Hair Reviewed  Eyes Reviewed  Mouth Reviewed  Skin Reviewed  Nails Reviewed   Diet Order:   Diet Order             Diet clear liquid Room service appropriate? Yes; Fluid consistency: Thin  Diet effective now                  EDUCATION NEEDS:   Education needs have been addressed  Skin:  Skin Assessment: Reviewed RN Assessment  Last BM:  6/6  Height:   Ht Readings from Last 1 Encounters:  06/19/22 5\' 6"  (1.676 m)    Weight:   Wt Readings from Last 1 Encounters:  06/19/22 67.4 kg    Ideal Body Weight:  59 kg  BMI:  Body mass index is 23.98 kg/m.  Estimated Nutritional Needs:   Kcal:  1700-1900kcal/day  Protein:  85-95g/day  Fluid:  1.8-2.1L/day  Baird Lyons  Jala Dundon MS, RD, LDN Please refer to AMION for RD and/or RD on-call/weekend/after hours pager  

## 2022-06-24 NOTE — Progress Notes (Signed)
[facility] SURGICAL PROGRESS NOTE (cpt [phone])  Hospital Day(s): 5.   Interval History: Patient seen and examined, no acute events or new complaints overnight. Patient reports she continues to have LLQ and suprapubic abdominal pain; this does seem slightly improved to previous examinations. No fever, chills, nausea. Remains without leukocytosis; 5.0K. Hgb to 11.3; stable. Renal function normal; sCr - 0.55; UO - unmeasured. No electrolyte derangements. She continues on Cipro & Flagyl. She is on CLD; tolerated  Review of Systems:  Constitutional: denies fever, chills  HEENT: denies cough or congestion  Respiratory: denies any shortness of breath  Cardiovascular: denies chest pain or palpitations  Gastrointestinal: + Abdominal pain; denied N/V Genitourinary: denies burning with urination or urinary frequency Musculoskeletal: denies pain, decreased motor or sensation  Vital signs in last 24 hours: [min-max] current  Temp:  [97.5 F (36.4 C)-97.9 F (36.6 C)] 97.7 F (36.5 C) (06/10 0414) Pulse Rate:  [53-66] 53 (06/10 0414) Resp:  [16-18] 16 (06/10 0414) BP: (113-141)/(66-78) 119/78 (06/10 0414) SpO2:  [93 %-97 %] 94 % (06/10 0414)     Height: 5\' 6"  (167.6 cm) Weight: 67.4 kg BMI (Calculated): 23.99   Intake/Output last 2 shifts:  06/09 0701 - 06/10 0700 In: 3386.1 [P.O.:440; I.V.:2236.9; IV Piggyback:709.2] Out: 0    Physical Exam:  Constitutional: alert, cooperative. Patient initially up at side of the bed; able to lay down without assistance  HENT: normocephalic without obvious abnormality  Eyes: PERRL, EOM's grossly intact and symmetric  Respiratory: breathing non-labored at rest  Cardiovascular: regular rate and sinus rhythm  Gastrointestinal: Abdomen is soft, still with LLQ abdominal tenderness, non-distended, no rebound/guarding  Musculoskeletal: no edema or wounds, motor and sensation grossly intact, NT    Labs:     Latest Ref Rng & Units 06/24/2022     4:23 AM 06/23/2022    4:29 AM 06/22/2022    4:16 AM  CBC  WBC 4.0 - 10.5 K/uL 5.0  5.2  3.8   Hemoglobin 12.0 - 15.0 g/dL 60.4  54.0  98.1   Hematocrit 36.0 - 46.0 % 35.5  39.3  35.1   Platelets 150 - 400 K/uL 197  185  191       Latest Ref Rng & Units 06/24/2022    4:23 AM 06/23/2022    4:29 AM 06/22/2022    4:16 AM  CMP  Glucose 70 - 99 mg/dL 191  75  96   BUN 8 - 23 mg/dL 6  6  6    Creatinine 0.44 - 1.00 mg/dL 4.78  2.95  6.21   Sodium 135 - 145 mmol/L 137  135  139   Potassium 3.5 - 5.1 mmol/L 3.6  4.2  4.4   Chloride 98 - 111 mmol/L 102  103  102   CO2 22 - 32 mmol/L 26  24  30    Calcium 8.9 - 10.3 mg/dL 7.9  8.1  8.5      Imaging studies: No new pertinent imaging studies   Assessment/Plan: (ICD-10's: K43.92) 62 y.o. female with diverticulitis with small (<1 cm) abscess           - Will go ahead and repeat CT Abdomen/Pelvis to reassess intra-abdominal process given persistent abdominal pain. Fortunately, she is not overtly peritonitic and has remained without leukocytosis  - Continue CLD; hold on advancement given pain  - No need for emergent surgical intervention currently. Continue conservative measures with goal of avoiding need for colostomy             -  Continue IV Abx (Cipro + Flagyl) - Monitor abdominal examination; on-going bowel function - Pain control prn; antiemetics prn              - Mobilize; as tolerated  - Monitor leukocytosis; normal x5 days             - Further management per primary service; we will follow   All of the above findings and recommendations were discussed with the patient, and the medical team, and all of patient's questions were answered to her expressed satisfaction.  -- Lynden Oxford, PA-C [facility] 06/24/2022, 7:20 AM M-F: 7am - 4pm

## 2022-06-24 NOTE — Progress Notes (Signed)
PROGRESS NOTE   HPI was taken from Dr. Para March: Stacy Gilmore is a 62 y.o. female with medical history significant for GERD asthma and COPD, hospitalized in January 2024 with acute uncomplicated diverticulitis as well as E. coli UTI, who presentsThe ED with left upper and lower quadrant pain that started several days prior.  She denies fevers or chills, nausea or vomiting, diarrhea or constipation or dysuria. ED course and data review: BP 133/98 with otherwise normal vitals.  Labs including CBC, CMP, lipase and urinalysis were all unremarkable.  CT abdomen and pelvis concerning for acute diverticulitis with possible abscess as follows.  I calling for further MPRESSION: 1. Acute diverticulitis of the distal descending colon. There is a small extraluminal enhancing fluid collection adjacent to the distal descending colon measuring 9 x 9 x 12 mm compatible with small abscess. 2. Left ground-glass pulmonary nodule measuring 4 mm. ...no routine follow-up imaging is recommended    Patient treated with morphine for pain, started on Cipro and metronidazole and hospitalist consulted for admission.      Stacy Gilmore  YNW:[id number] DOB: [birth year] DOA: 06/19/2022 PCP: No primary care provider on file.    Assessment & Plan:   Principal Problem:   Acute diverticulitis Active Problems:   History of COPD   History of gastroesophageal reflux (GERD)   Cocaine use   Diverticulitis of intestine with abscess, recurrent  Assessment and Plan:  Diverticulitis: w/ abscess & recurrent (third episode). Continue on cipro, flagyl. Last episode of diverticulitis was 01/2022. Hemodynamically stable but still with pain llq. Zofran prn. Morphine prn. On clears per gen surg, repeat ct today. If unable to advance diet will consider tpn  GERD: continue on PPI    COPD: w/o exacerbation. Bronchodilators prn        DVT prophylaxis: lovenox  Code Status: full  Family Communication Disposition Plan:  tbd  Level of care: Med-Surg  Status is: Inpatient Remains inpatient appropriate because: severity of illness, still w/ significant abd pain, possible need for surgery  Consultants:  Gen surg   Procedures:   Antimicrobials: flagyl, cipro    Subjective: Pt c/o abd pain, no nausea, is stooling  Objective: Vitals:   06/23/22 1617 06/23/22 1941 06/24/22 0414 06/24/22 0818  BP: 113/67 (!) 141/69 119/78 (!) 141/87  Pulse: (!) 56 66 (!) 53 (!) 53  Resp: 18 18 16 18   Temp: 97.8 F (36.6 C) (!) 97.5 F (36.4 C) 97.7 F (36.5 C) 97.6 F (36.4 C)  TempSrc:  Oral Oral Oral  SpO2: 93% 97% 94% 97%  Weight:      Height:        Intake/Output Summary (Last 24 hours) at 06/24/2022 1526 Last data filed at 06/24/2022 1428 Gross per 24 hour  Intake 2007.78 ml  Output 0 ml  Net 2007.78 ml   Filed Weights   06/19/22 1314  Weight: 67.4 kg    Examination:  General exam: Appears uncomfortable  Respiratory system: clear breath sounds b/l  Cardiovascular system: S1/S2+. No rubs or gallops  Gastrointestinal system: abd is soft, tender in the llq Central nervous system: alert & oriented. Moves all extremities  Psychiatry: Judgement and insight appears normal. Appropriate mood and affect.     Data Reviewed: I have personally reviewed following labs and imaging studies  CBC: Recent Labs  Lab 06/19/22 1316 06/21/22 0440 06/22/22 0416 06/23/22 0429 06/24/22 0423  WBC 8.6 4.2 3.8* 5.2 5.0  NEUTROABS  --  2.2  --   --   --  HGB 14.1 11.2* 10.9* 12.0 11.3*  HCT 43.9 36.1 35.1* 39.3 35.5*  MCV 84.4 86.6 86.0 86.8 84.5  PLT 285 189 191 185 197   Basic Metabolic Panel: Recent Labs  Lab 06/19/22 1316 06/21/22 0440 06/22/22 0416 06/23/22 0429 06/24/22 0423  NA 136 138 139 135 137  K 3.9 3.9 4.4 4.2 3.6  CL 100 103 102 103 102  CO2 25 30 30 24 26   GLUCOSE 99 96 96 75 106*  BUN 11 6* 6* 6* 6*  CREATININE 0.54 0.62 0.67 0.69 0.55  CALCIUM 9.0 8.4* 8.5* 8.1* 7.9*    GFR: Estimated Creatinine Clearance: 69.1 mL/min (by C-G formula based on SCr of 0.55 mg/dL). Liver Function Tests: Recent Labs  Lab 06/19/22 1316 06/21/22 0440  AST 35 55*  ALT 40 64*  ALKPHOS 75 76  BILITOT 0.8 0.5  PROT 7.6 5.6*  ALBUMIN 4.2 2.9*   Recent Labs  Lab 06/19/22 1316  LIPASE 46   No results for input(s): "AMMONIA" in the last 168 hours. Coagulation Profile: No results for input(s): "INR", "PROTIME" in the last 168 hours. Cardiac Enzymes: No results for input(s): "CKTOTAL", "CKMB", "CKMBINDEX", "TROPONINI" in the last 168 hours. BNP (last 3 results) No results for input(s): "PROBNP" in the last 8760 hours. HbA1C: No results for input(s): "HGBA1C" in the last 72 hours. CBG: No results for input(s): "GLUCAP" in the last 168 hours. Lipid Profile: No results for input(s): "CHOL", "HDL", "LDLCALC", "TRIG", "CHOLHDL", "LDLDIRECT" in the last 72 hours. Thyroid Function Tests: No results for input(s): "TSH", "T4TOTAL", "FREET4", "T3FREE", "THYROIDAB" in the last 72 hours. Anemia Panel: No results for input(s): "VITAMINB12", "FOLATE", "FERRITIN", "TIBC", "IRON", "RETICCTPCT" in the last 72 hours. Sepsis Labs: No results for input(s): "PROCALCITON", "LATICACIDVEN" in the last 168 hours.  No results found for this or any previous visit (from the past 240 hour(s)).       Radiology Studies: No results found.      Scheduled Meds:  enoxaparin (LOVENOX) injection  40 mg Subcutaneous QHS   fluticasone furoate-vilanterol  1 puff Inhalation Daily   pantoprazole  40 mg Oral Daily   Continuous Infusions:  ciprofloxacin 400 mg (06/24/22 4098)   lactated ringers 100 mL/hr at 06/24/22 0330   metronidazole 500 mg (06/24/22 0508)     LOS: 5 days    Time spent: 25 mins     Silvano Bilis, MD [facility] If 7PM-7AM, please contact night-coverage [email or web address] 06/24/2022, 3:26 PM

## 2022-06-25 DIAGNOSIS — Z8719 Personal history of other diseases of the digestive system: Secondary | ICD-10-CM

## 2022-06-25 DIAGNOSIS — Z8709 Personal history of other diseases of the respiratory system: Secondary | ICD-10-CM

## 2022-06-25 DIAGNOSIS — K5720 Diverticulitis of large intestine with perforation and abscess without bleeding: Secondary | ICD-10-CM | POA: Diagnosis not present

## 2022-06-25 DIAGNOSIS — E440 Moderate protein-calorie malnutrition: Secondary | ICD-10-CM

## 2022-06-25 MED ORDER — ENSURE ENLIVE PO LIQD
237.0000 mL | Freq: Three times a day (TID) | ORAL | Status: DC
  Administered 2022-06-25 – 2022-06-27 (×4): 237 mL via ORAL

## 2022-06-25 MED ORDER — MELATONIN 5 MG PO TABS
2.5000 mg | ORAL_TABLET | Freq: Every day | ORAL | Status: DC
  Administered 2022-06-25 – 2022-06-26 (×2): 2.5 mg via ORAL
  Filled 2022-06-25 (×2): qty 1

## 2022-06-25 MED ORDER — LOPERAMIDE HCL 2 MG PO CAPS
2.0000 mg | ORAL_CAPSULE | Freq: Three times a day (TID) | ORAL | Status: DC | PRN
  Administered 2022-06-25 (×2): 2 mg via ORAL
  Filled 2022-06-25 (×2): qty 1

## 2022-06-25 NOTE — Progress Notes (Signed)
[facility] SURGICAL PROGRESS NOTE (cpt [phone])  Hospital Day(s): 6.   Interval History: Patient seen and examined, no acute events or new complaints overnight. Patient reports she is doing alright this morning; she is sitting up in bed. Still with LLQ pain but thinks "she may have pulled something." No fever, chills, nausea. No new labs this morning. CT Abdomen/Pelvis is reassuring.  She continues on Cipro & Flagyl. She is on CLD; tolerated  Review of Systems:  Constitutional: denies fever, chills  HEENT: denies cough or congestion  Respiratory: denies any shortness of breath  Cardiovascular: denies chest pain or palpitations  Gastrointestinal: + Abdominal pain; denied N/V Genitourinary: denies burning with urination or urinary frequency Musculoskeletal: denies pain, decreased motor or sensation  Vital signs in last 24 hours: [min-max] current  Temp:  [97.5 F (36.4 C)-97.6 F (36.4 C)] 97.5 F (36.4 C) (06/11 0416) Pulse Rate:  [49-54] 49 (06/11 0416) Resp:  [18-20] 20 (06/11 0416) BP: (134-151)/(72-87) 144/76 (06/11 0416) SpO2:  [93 %-98 %] 95 % (06/11 0416) Weight:  [68 kg] 68 kg (06/11 0457)     Height: 5\' 6"  (167.6 cm) Weight: 68 kg BMI (Calculated): 24.21   Intake/Output last 2 shifts:  06/10 0701 - 06/11 0700 In: 3624.4 [P.O.:1770; I.V.:1108.3; IV Piggyback:746.1] Out: -    Physical Exam:  Constitutional: alert, cooperative. Patient initially up at side of the bed; able to lay down without assistance  HENT: normocephalic without obvious abnormality  Eyes: PERRL, EOM's grossly intact and symmetric  Respiratory: breathing non-labored at rest  Cardiovascular: regular rate and sinus rhythm  Gastrointestinal: Abdomen is soft, still with LLQ abdominal tenderness, non-distended, no rebound/guarding  Musculoskeletal: no edema or wounds, motor and sensation grossly intact, NT    Labs:     Latest Ref Rng & Units 06/24/2022    4:23 AM 06/23/2022    4:29 AM  06/22/2022    4:16 AM  CBC  WBC 4.0 - 10.5 K/uL 5.0  5.2  3.8   Hemoglobin 12.0 - 15.0 g/dL 19.1  47.8  29.5   Hematocrit 36.0 - 46.0 % 35.5  39.3  35.1   Platelets 150 - 400 K/uL 197  185  191       Latest Ref Rng & Units 06/24/2022    4:23 AM 06/23/2022    4:29 AM 06/22/2022    4:16 AM  CMP  Glucose 70 - 99 mg/dL 621  75  96   BUN 8 - 23 mg/dL 6  6  6    Creatinine 0.44 - 1.00 mg/dL 3.08  6.57  8.46   Sodium 135 - 145 mmol/L 137  135  139   Potassium 3.5 - 5.1 mmol/L 3.6  4.2  4.4   Chloride 98 - 111 mmol/L 102  103  102   CO2 22 - 32 mmol/L 26  24  30    Calcium 8.9 - 10.3 mg/dL 7.9  8.1  8.5      Imaging studies:   CT Abdomen/Pelvis (06/24/2022) personally reviewed showing improvement in diverticular process, no free air, and radiologist report reviewed below: IMPRESSION: 1. Interval improvement of acute diverticulitis of the descending colon. Small extraluminal collection/abscess along the descending colon is decreased in prominence. 2. New mild inflammation along the second portion duodenum suggest duodenitis. 3. No intraperitoneal free air.    Assessment/Plan: (ICD-10's: K37.92) 62 y.o. female with clinically and radiographically improving diverticulitis with small (<1 cm) abscess            -  I think it is fair to try to advance diet; FLD  - CT reassuring with improvement/resolution in abdominal process   - No need for emergent surgical intervention currently. Continue conservative measures with goal of avoiding need for colostomy             - Continue IV Abx (Cipro + Flagyl) - Monitor abdominal examination; on-going bowel function - Pain control prn; antiemetics prn              - Mobilize; as tolerated  - Monitor leukocytosis; normal x5 days             - Further management per primary service; we will follow   All of the above findings and recommendations were discussed with the patient, and the medical team, and all of patient's questions were answered to her  expressed satisfaction.  -- Lynden Oxford, PA-C [facility] 06/25/2022, 7:39 AM M-F: 7am - 4pm

## 2022-06-25 NOTE — Progress Notes (Signed)
Nutrition Follow Up Note   DOCUMENTATION CODES:   Non-severe (moderate) malnutrition in context of chronic illness  INTERVENTION:   Ensure Enlive po TID, each supplement provides 350 kcal and 20 grams of protein.  MVI po daily   Pt at high refeed risk; recommend monitor potassium, magnesium and phosphorus labs daily until stable  NUTRITION DIAGNOSIS:   Moderate Malnutrition related to chronic illness as evidenced by mild fat depletion, mild muscle depletion, percent weight loss.  GOAL:   Patient will meet greater than or equal to 90% of their needs -progressing   MONITOR:   PO intake, Supplement acceptance, Diet advancement, Labs, Weight trends, I & O's, Skin   ASSESSMENT:   62 y/o female with h/o COPD, GERD and substance abuse who is admitted with acute diverticulitis.  Pt advanced to a full liquid diet today. CT scan from yesterday reports improvement of diverticulitis and new duodenitis. RD will add Ensure to help pt meet her estimated needs. Pt is at high refeed risk; will monitor electrolytes. Per chart, pt appears to be at her UBW.   Medications reviewed and include: lovenox, melatonin, MVI, protonix, ciprofloxacin, LRS @100ml /hr, metronidazole   Labs reviewed: K 3.6 wnl, BUN 6(L)- 6/10  Diet Order:    Diet Order             Diet full liquid Room service appropriate? Yes; Fluid consistency: Thin  Diet effective now                  EDUCATION NEEDS:   Education needs have been addressed  Skin:  Skin Assessment: Reviewed RN Assessment  Last BM:  6/11  Height:   Ht Readings from Last 1 Encounters:  06/19/22 5\' 6"  (1.676 m)    Weight:   Wt Readings from Last 1 Encounters:  06/25/22 68 kg    Ideal Body Weight:  59 kg  BMI:  Body mass index is 24.2 kg/m.  Estimated Nutritional Needs:   Kcal:  1700-1900kcal/day  Protein:  85-95g/day  Fluid:  1.8-2.1L/day  Betsey Holiday MS, RD, LDN Please refer to [facility] for RD and/or RD  on-call/weekend/after hours pager

## 2022-06-25 NOTE — Plan of Care (Signed)

## 2022-06-25 NOTE — Progress Notes (Signed)
  Progress Note   Patient: Stacy Gilmore ZOX:[id number] DOB: 12/09/1960 DOA: 06/19/2022     6 DOS: the patient was seen and examined on 06/25/2022   Brief hospital course: Stacy Gilmore is a 62 y.o. female with medical history significant for GERD asthma and COPD, hospitalized in January 2024 with acute uncomplicated diverticulitis as well as E. coli UTI, presented with left upper and lower quadrant pain. CT abdomen and pelvis concerning for acute diverticulitis with possible abscess seen by surgery team, recommended antibiotics, fluids, antiemetics, medical supportive care, no surgical intervention.  Assessment and Plan:  Principal Problem:   Acute diverticulitis Active Problems:   History of COPD   History of gastroesophageal reflux (GERD)   Cocaine use   Diverticulitis of intestine with abscess, recurrent  Diverticulitis: w/ abscess & recurrent (third episode).  She will be continued on cipro, flagyl.  She does have LLQ tenderness. Abdominal pain, nausea and vomiting improved.  Continue Zofran prn. Morphine prn.  Surgery team follow up appreciated. Repeat CT abdomen and pelvis reassuring. Clears advanced to full liquid diet. Clinically she is responding to treatment, no surgical intervention planned.   GERD: continue on PPI    COPD: w/o exacerbation. Bronchodilators prn       Subjective: Patient is seen and examined to today morning. She is lying in bed, able to tolerate clears. Wishes to try full liquids. No nausea or vomiting. Abdominal discomfort better. Asks for sleeping aide.  Physical Exam: Vitals:   06/24/22 1951 06/25/22 0416 06/25/22 0457 06/25/22 0934  BP: (!) 151/85 (!) 144/76  (!) 153/73  Pulse: (!) 53 (!) 49  (!) 50  Resp: 20 20  18   Temp: (!) 97.5 F (36.4 C) (!) 97.5 F (36.4 C)  97.8 F (36.6 C)  TempSrc: Oral Oral    SpO2: 98% 95%  99%  Weight:   68 kg   Height:       General - Middle aged Caucasian female, no apparent distress HEENT -  PERRLA, EOMI, atraumatic head, non tender sinuses. Lung - Clear, rales, rhonchi, wheezes. Heart - S1, S2 heard, no murmurs, rubs, trace pedal edema. Abdomen - soft, LLQ tenderness, bowel sounds good. Neuro - Alert, awake and oriented x 3, non focal exam. Skin - Warm and dry. Data Reviewed:  Repeat CT abdomen pelvis which is improving.  Family Communication: Patient understand and agree with above care plan.  Disposition: Status is: Inpatient Remains inpatient appropriate because: IV antibiotics, fluids, antiemetics.  Planned Discharge Destination: Home   DVT prophylaxis - Lovenox. Melatonin prn for sleep ordered. Nursing supportive care. CODE STATUS- FULL  Time spent: 43 minutes  Author: Marcelino Duster, MD 06/25/2022 4:55 PM  For on call review [email or web address].

## 2022-06-26 ENCOUNTER — Other Ambulatory Visit: Payer: Self-pay

## 2022-06-26 DIAGNOSIS — F1490 Cocaine use, unspecified, uncomplicated: Secondary | ICD-10-CM

## 2022-06-26 DIAGNOSIS — K5720 Diverticulitis of large intestine with perforation and abscess without bleeding: Secondary | ICD-10-CM | POA: Diagnosis not present

## 2022-06-26 LAB — PHOSPHORUS: Phosphorus: 4.4 mg/dL (ref 2.5–4.6)

## 2022-06-26 LAB — MAGNESIUM: Magnesium: 1.7 mg/dL (ref 1.7–2.4)

## 2022-06-26 LAB — POTASSIUM: Potassium: 4 mmol/L (ref 3.5–5.1)

## 2022-06-26 MED ORDER — CIPROFLOXACIN HCL 500 MG PO TABS
500.0000 mg | ORAL_TABLET | Freq: Two times a day (BID) | ORAL | Status: DC
  Administered 2022-06-26 – 2022-06-27 (×2): 500 mg via ORAL
  Filled 2022-06-26 (×2): qty 1

## 2022-06-26 MED ORDER — SENNA 8.6 MG PO TABS
1.0000 | ORAL_TABLET | Freq: Every day | ORAL | Status: DC
  Administered 2022-06-26: 8.6 mg via ORAL
  Filled 2022-06-26: qty 1

## 2022-06-26 MED ORDER — DOCUSATE SODIUM 100 MG PO CAPS
100.0000 mg | ORAL_CAPSULE | Freq: Two times a day (BID) | ORAL | Status: DC
  Administered 2022-06-26 – 2022-06-27 (×2): 100 mg via ORAL
  Filled 2022-06-26 (×2): qty 1

## 2022-06-26 MED ORDER — METRONIDAZOLE 500 MG PO TABS
500.0000 mg | ORAL_TABLET | Freq: Two times a day (BID) | ORAL | Status: DC
  Administered 2022-06-26 – 2022-06-27 (×2): 500 mg via ORAL
  Filled 2022-06-26 (×3): qty 1

## 2022-06-26 NOTE — Progress Notes (Signed)
[facility] SURGICAL PROGRESS NOTE (cpt [phone])  Hospital Day(s): 7.   Interval History: Patient seen and examined, no acute events or new complaints overnight. Patient reports she is doing okay this morning. She continues to note LLQ soreness but with distraction is non-tender. No fever, chills, nausea. Labs this morning are reassuring. Again, CT Abdomen/Pelvis is reassuring.  She continues on Cipro & Flagyl. She is on FLD; tolerating. She is "dying for a cheeseburger."  Review of Systems:  Constitutional: denies fever, chills  HEENT: denies cough or congestion  Respiratory: denies any shortness of breath  Cardiovascular: denies chest pain or palpitations  Gastrointestinal: + Abdominal pain; denied N/V Genitourinary: denies burning with urination or urinary frequency Musculoskeletal: denies pain, decreased motor or sensation  Vital signs in last 24 hours: [min-max] current  Temp:  [97.5 F (36.4 C)-98.5 F (36.9 C)] 98.5 F (36.9 C) (06/12 0446) Pulse Rate:  [50-58] 52 (06/12 0446) Resp:  [18] 18 (06/12 0446) BP: (104-153)/(63-80) 134/63 (06/12 0446) SpO2:  [97 %-100 %] 97 % (06/12 0446)     Height: 5\' 6"  (167.6 cm) Weight: 68 kg BMI (Calculated): 24.21   Intake/Output last 2 shifts:  06/11 0701 - 06/12 0700 In: 1100 [P.O.:1100] Out: -    Physical Exam:  Constitutional: alert, cooperative. Patient initially up at side of the bed; able to lay down without assistance  HENT: normocephalic without obvious abnormality  Eyes: PERRL, EOM's grossly intact and symmetric  Respiratory: breathing non-labored at rest  Cardiovascular: regular rate and sinus rhythm  Gastrointestinal: Abdomen is soft, LLQ soreness but when distracted with without pain, non-distended, no rebound/guarding. She is overtly without peritonitis Musculoskeletal: no edema or wounds, motor and sensation grossly intact, NT    Labs:     Latest Ref Rng & Units 06/24/2022    4:23 AM 06/23/2022    4:29  AM 06/22/2022    4:16 AM  CBC  WBC 4.0 - 10.5 K/uL 5.0  5.2  3.8   Hemoglobin 12.0 - 15.0 g/dL 60.4  54.0  98.1   Hematocrit 36.0 - 46.0 % 35.5  39.3  35.1   Platelets 150 - 400 K/uL 197  185  191       Latest Ref Rng & Units 06/26/2022    5:53 AM 06/24/2022    4:23 AM 06/23/2022    4:29 AM  CMP  Glucose 70 - 99 mg/dL  191  75   BUN 8 - 23 mg/dL  6  6   Creatinine 4.78 - 1.00 mg/dL  2.95  6.21   Sodium 308 - 145 mmol/L  137  135   Potassium 3.5 - 5.1 mmol/L 4.0  3.6  4.2   Chloride 98 - 111 mmol/L  102  103   CO2 22 - 32 mmol/L  26  24   Calcium 8.9 - 10.3 mg/dL  7.9  8.1      Imaging studies:  No new imaging   Assessment/Plan: (ICD-10's: K57.92) 62 y.o. female with clinically and radiographically improving diverticulitis with small (<1 cm) abscess            - Soft diet   - CT reassuring with improvement/resolution in abdominal process   - No need for emergent surgical intervention currently. Continue conservative measures with goal of avoiding need for colostomy             - Continue IV Abx (Cipro + Flagyl) - Monitor abdominal examination; on-going bowel function - Pain control prn; antiemetics  prn              - Mobilize; as tolerated  - Monitor leukocytosis; normal x5 days             - Further management per primary service   - Discharge Planning; Okay for DC from surgical perspective in next 24 hours. She will need Abx for home to complete at least 10 days (IV+PO). We will follow up with her in 3-4 weeks.   All of the above findings and recommendations were discussed with the patient, and the medical team, and all of patient's questions were answered to her expressed satisfaction.  -- Lynden Oxford, PA-C [facility] 06/26/2022, 7:29 AM M-F: 7am - 4pm

## 2022-06-26 NOTE — TOC Progression Note (Signed)
Transition of Care [facility]) - Progression Note    Patient Details  Name: Stacy Gilmore MRN: [id number] Date of Birth: [birth year]  Transition of Care [facility]) CM/SW Contact  Chapman Fitch, RN Phone Number: 06/26/2022, 5:09 PM  Clinical Narrative:      Please consult TOC if home IV antibiotics needed at discharge       Expected Discharge Plan and Services                                               Social Determinants of Health (SDOH) Interventions SDOH Screenings   Food Insecurity: Food Insecurity Present (06/20/2022)  Housing: Low Risk  (06/20/2022)  Transportation Needs: No Transportation Needs (06/20/2022)  Utilities: Not At Risk (06/20/2022)  Alcohol Screen: Low Risk  (03/19/2022)  Depression (PHQ2-9): High Risk (03/19/2022)  Financial Resource Strain: High Risk (03/19/2022)  Physical Activity: Inactive (03/19/2022)  Social Connections: Socially Isolated (03/19/2022)  Stress: Stress Concern Present (03/19/2022)  Tobacco Use: High Risk (06/24/2022)    Readmission Risk Interventions     No data to display

## 2022-06-26 NOTE — Plan of Care (Signed)

## 2022-06-26 NOTE — Progress Notes (Signed)
  Progress Note   Patient: Stacy Gilmore:[id number] DOB: 01/15/1960 DOA: 06/19/2022     7 DOS: the patient was seen and examined on 06/26/2022   Brief hospital course: KADEN DUNKEL is a 62 y.o. female with medical history significant for GERD asthma and COPD, hospitalized in January 2024 with acute uncomplicated diverticulitis as well as E. coli UTI, presented with left upper and lower quadrant pain. CT abdomen and pelvis concerning for acute diverticulitis with possible abscess seen by surgery team, recommended antibiotics, fluids, antiemetics, medical supportive care, no surgical intervention. Repeat CT abdomen is reassuring, able to tolerate diet.  Assessment and Plan:  Principal Problem:   Acute diverticulitis Active Problems:   History of COPD   History of gastroesophageal reflux (GERD)   Cocaine use   Diverticulitis of intestine with abscess, recurrent  Diverticulitis: w/ abscess & recurrent (third episode).  She will be continued on cipro, flagyl for total 14 days. She does have LLQ tenderness but improving. Continue Zofran prn. Morphine prn.  Surgery team follow up appreciated. Outpatient follow up suggested.. Liquid diet advanced to soft diet. Clinically she is responding to treatment, no surgical intervention planned.   GERD: continue on PPI    COPD: w/o exacerbation. Bronchodilators prn   Constipation regimen ordered.      Subjective: Patient is seen and examined to today morning. She is lying in bed, able to tolerate liquid diet. Has constipation, no more diarrhea. Asks for stool softeners. No other issues.  Physical Exam: Vitals:   06/25/22 1656 06/25/22 1900 06/26/22 0446 06/26/22 1532  BP: 104/80 104/79 134/63 134/76  Pulse: (!) 54 (!) 58 (!) 52 (!) 55  Resp: 18 18 18 18   Temp: (!) 97.5 F (36.4 C) 97.8 F (36.6 C) 98.5 F (36.9 C) 97.7 F (36.5 C)  TempSrc:    Oral  SpO2: 100% 100% 97% 96%  Weight:      Height:       General - Middle  aged Caucasian female, no apparent distress HEENT - PERRLA, EOMI, atraumatic head, non tender sinuses. Lung - Clear, rales, rhonchi, wheezes. Heart - S1, S2 heard, no murmurs, rubs, trace pedal edema. Abdomen - soft, LLQ tenderness, bowel sounds good. Neuro - Alert, awake and oriented x 3, non focal exam. Skin - Warm and dry. Data Reviewed:  Potassium, mag, phos levels.  Family Communication: Patient understand and agree with above care plan.  Disposition: Status is: Inpatient Remains inpatient appropriate because: advancing diet, need for abx, antiemetics.  Planned Discharge Destination: Home   DVT prophylaxis - Lovenox. Melatonin prn for sleep ordered. Nursing supportive care. CODE STATUS- FULL  Time spent: 41 minutes  Author: Marcelino Duster, MD 06/26/2022 5:28 PM  For on call review [email or web address].

## 2022-06-27 ENCOUNTER — Other Ambulatory Visit: Payer: Self-pay

## 2022-06-27 DIAGNOSIS — I10 Essential (primary) hypertension: Secondary | ICD-10-CM

## 2022-06-27 DIAGNOSIS — K219 Gastro-esophageal reflux disease without esophagitis: Secondary | ICD-10-CM

## 2022-06-27 DIAGNOSIS — E440 Moderate protein-calorie malnutrition: Secondary | ICD-10-CM

## 2022-06-27 MED ORDER — ONDANSETRON HCL 4 MG PO TABS
4.0000 mg | ORAL_TABLET | Freq: Four times a day (QID) | ORAL | 0 refills | Status: DC | PRN
  Filled 2022-06-27: qty 20, 5d supply, fill #0

## 2022-06-27 MED ORDER — OMEPRAZOLE 20 MG PO CPDR
20.0000 mg | DELAYED_RELEASE_CAPSULE | Freq: Every day | ORAL | 3 refills | Status: AC
Start: 2022-06-27 — End: ?
  Filled 2022-06-27: qty 90, 90d supply, fill #0
  Filled 2022-09-02: qty 90, 90d supply, fill #1

## 2022-06-27 MED ORDER — POLYETHYLENE GLYCOL 3350 17 GM/SCOOP PO POWD
17.0000 g | Freq: Every day | ORAL | 1 refills | Status: DC
Start: 2022-06-27 — End: 2022-10-04
  Filled 2022-06-27: qty 238, 14d supply, fill #0
  Filled 2022-09-02 – 2022-09-03 (×2): qty 238, 14d supply, fill #1

## 2022-06-27 MED ORDER — HYDROCODONE-ACETAMINOPHEN 5-325 MG PO TABS
1.0000 | ORAL_TABLET | Freq: Four times a day (QID) | ORAL | 0 refills | Status: DC | PRN
  Filled 2022-06-27: qty 10, 3d supply, fill #0

## 2022-06-27 MED ORDER — ENSURE ENLIVE PO LIQD
237.0000 mL | Freq: Three times a day (TID) | ORAL | 3 refills | Status: DC
  Filled 2022-06-27: qty 237, 1d supply, fill #0

## 2022-06-27 MED ORDER — SENNOSIDES-DOCUSATE SODIUM 8.6-50 MG PO TABS
1.0000 | ORAL_TABLET | Freq: Every evening | ORAL | 1 refills | Status: AC | PRN
  Filled 2022-06-27 – 2023-02-28 (×2): qty 30, 30d supply, fill #0
  Filled 2023-04-23: qty 30, 30d supply, fill #1

## 2022-06-27 MED ORDER — METRONIDAZOLE 500 MG PO TABS
500.0000 mg | ORAL_TABLET | Freq: Two times a day (BID) | ORAL | 0 refills | Status: AC
  Filled 2022-06-27: qty 10, 5d supply, fill #0

## 2022-06-27 MED ORDER — ADULT MULTIVITAMIN W/MINERALS CH
1.0000 | ORAL_TABLET | Freq: Every day | ORAL | 3 refills | Status: DC
  Filled 2022-06-27: qty 30, 30d supply, fill #0

## 2022-06-27 MED ORDER — CIPROFLOXACIN HCL 500 MG PO TABS
500.0000 mg | ORAL_TABLET | Freq: Two times a day (BID) | ORAL | 0 refills | Status: AC
  Filled 2022-06-27: qty 10, 5d supply, fill #0

## 2022-06-27 NOTE — Discharge Summary (Signed)
Physician Discharge Summary   Patient: Stacy Gilmore MRN: [id number] DOB: 1960-07-16  Admit date:     06/19/2022  Discharge date: 06/27/22  Discharge Physician: Marcelino Duster   PCP: No primary care provider on file.   Recommendations at discharge:    PCP follow up in 1week. Complete antibiotic regimen. Follow up with Surgery clinic as scheduled.  Discharge Diagnoses: Principal Problem:   Acute diverticulitis Active Problems:   History of COPD   History of gastroesophageal reflux (GERD)   Cocaine use   Diverticulitis of intestine with abscess, recurrent   Malnutrition of moderate degree  Resolved Problems:   * No resolved hospital problems. *  Hospital Course: Stacy Gilmore is a 62 y.o. female with medical history significant for GERD asthma and COPD, hospitalized in January 2024 with acute uncomplicated diverticulitis as well as E. coli UTI, presented with left upper and lower quadrant pain. CT abdomen and pelvis concerning for acute diverticulitis with possible abscess seen by surgery team, recommended antibiotics, fluids, antiemetics, medical supportive care, no surgical intervention. Repeat CT abdomen is reassuring, with interval improvement of diverticulitis, abscess collection. Her symptoms gradually improved, started on clears and gradually advanced diet. No more diarrhea, does have constipation which is usual for her. Surgery team advised to complete 14 days of antibiotic treatment. Today she is able to tolerate regular diet and is hemodynamically stable to be discharged home. New scripts sent to pharmacy. I advised PCP follow up in 1 week and surgery follow up as scheduled. She understands and agrees.      Pain control - [employer] Controlled Substance Reporting System database was reviewed. and patient was instructed, not to drive, operate heavy machinery, perform activities at heights, swimming or participation in water activities or provide baby-sitting  services while on Pain, Sleep and Anxiety Medications; until their outpatient Physician has advised to do so again. Also recommended to not to take more than prescribed Pain, Sleep and Anxiety Medications.  Consultants: Surgery Procedures performed: none  Disposition: Home Diet recommendation:  Discharge Diet Orders (From admission, onward)     Start     Ordered   06/27/22 0000  Diet - low sodium heart healthy        06/27/22 1010           Cardiac diet DISCHARGE MEDICATION: Allergies as of 06/27/2022       Reactions   Bee Venom Swelling   Penicillins Hives, Swelling   Armoracia Rusticana Ext (horseradish) Rash   Blisters the mouth   Other Rash   Ragu spaghetti sauce        Medication List     STOP taking these medications    naproxen 500 MG tablet Commonly known as: NAPROSYN       TAKE these medications    Breo Ellipta 100-25 MCG/ACT Aepb Generic drug: fluticasone furoate-vilanterol Inhale 1 puff into the lungs daily. Rinse and brush thoroughly after administration   ciprofloxacin 500 MG tablet Commonly known as: CIPRO Take 1 tablet (500 mg total) by mouth 2 (two) times daily for 5 days.   feeding supplement Liqd Take 237 mLs by mouth 3 (three) times daily between meals.   HYDROcodone-acetaminophen 5-325 MG tablet Commonly known as: NORCO/VICODIN Take 1 tablet by mouth every 6 (six) hours as needed for moderate pain.   metroNIDAZOLE 500 MG tablet Commonly known as: FLAGYL Take 1 tablet (500 mg total) by mouth every 12 (twelve) hours for 5 days.   multivitamin with minerals Tabs tablet  Take 1 tablet by mouth daily.   omeprazole 20 MG capsule Commonly known as: PRILOSEC Take 1 capsule (20 mg total) by mouth daily.   ondansetron 4 MG tablet Commonly known as: ZOFRAN Take 1 tablet (4 mg total) by mouth every 6 (six) hours as needed for nausea.   polyethylene glycol powder 17 GM/SCOOP powder Commonly known as: MiraLax Take 17 g by mouth daily.    ProAir HFA 108 (90 Base) MCG/ACT inhaler Generic drug: albuterol Inhale 2 puffs into the lungs every 4 (four) hours as needed for wheezing or shortness of breath.   senna-docusate 8.6-50 MG tablet Commonly known as: Senokot-S Take 1 tablet by mouth at bedtime as needed for mild constipation.        Follow-up Information     [name], MD. Schedule an appointment as soon as possible for a visit in 4 week(s).   Specialty: General Surgery Why: hospital follow up; diverticulitis Contact information: [address] [place] [address] [phone]                Discharge Exam: Filed Weights   06/19/22 1314 06/25/22 0457 06/27/22 0500  Weight: 67.4 kg 68 kg 67.4 kg  Blood pressure (!) 140/84, pulse (!) 51, temperature 97.9 F (36.6 C), temperature source Oral, resp. rate 18, height 5\' 6"  (1.676 m), weight 67.4 kg, SpO2 95 %.  General - Middle aged Caucasian female, no apparent distress HEENT - PERRLA, EOMI, atraumatic head, non tender sinuses. Lung - Clear, rales, rhonchi, wheezes. Heart - S1, S2 heard, no murmurs, rubs, trace pedal edema. Abdomen - soft, LLQ tenderness improved, bowel sounds good. Neuro - Alert, awake and oriented x 3, non focal exam. Skin - Warm and dry.  Condition at discharge: stable  The results of significant diagnostics from this hospitalization (including imaging, microbiology, ancillary and laboratory) are listed below for reference.   Imaging Studies: CT ABDOMEN PELVIS W CONTRAST  Result Date: 06/24/2022 CLINICAL DATA:  Abdominal pain.  Acute nonlocalized. EXAM: CT ABDOMEN AND PELVIS WITH CONTRAST TECHNIQUE: Multidetector CT imaging of the abdomen and pelvis was performed using the standard protocol following bolus administration of intravenous contrast. RADIATION DOSE REDUCTION: This exam was performed according to the departmental dose-optimization program which includes automated exposure control, adjustment of the mA  and/or kV according to patient size and/or use of iterative reconstruction technique. CONTRAST:  OMNIPAQUE IOHEXOL 300 MG/ML  SOLN COMPARISON:  None Available. FINDINGS: Lower chest: Mild linear basilar atelectasis. No effusion or infiltrate. Hepatobiliary: No focal hepatic lesion. Postcholecystectomy. No biliary dilatation. Pancreas: Pancreas is normal. No ductal dilatation. No pancreatic inflammation. Spleen: Normal spleen Adrenals/urinary tract: Adrenal glands are thickened suggesting hyperplasia. Kidneys, ureters and bladder normal. Stomach/Bowel: Stomach is normal. There is subtle inflammation within the fat adjacent to the C-loop of the duodenum position between the liver and duodenum (image 29/series 2). Third portion duodenum normal. Normal small bowel. Appendix normal. There is inflammation involving the descending colon similar comparison exam. The small extraluminal collection along the anti mesenteric border of the descending colon is less prominent (image 55/2). Distal sigmoid colon rectum normal. Vascular/Lymphatic: Abdominal aorta is normal caliber. No periportal or retroperitoneal adenopathy. No pelvic adenopathy. Reproductive: Uterus and adnexa unremarkable. Other: No free fluid. Musculoskeletal: No aggressive osseous lesion. IMPRESSION: 1. Interval improvement of acute diverticulitis of the descending colon. Small extraluminal collection/abscess along the descending colon is decreased in prominence. 2. New mild inflammation along the second portion duodenum suggest duodenitis. 3. No intraperitoneal free  air. Electronically Signed   By: Genevive Bi M.D.   On: 06/24/2022 17:16   DG ABD ACUTE 2+V W 1V CHEST  Result Date: 06/22/2022 CLINICAL DATA:  Ileus.  Left inguinal pain. EXAM: DG ABDOMEN ACUTE WITH 1 VIEW CHEST COMPARISON:  CT abdomen/pelvis 06/19/2022. FINDINGS: Chest: The cardiomediastinal silhouette is normal. There is no focal consolidation or pulmonary edema. There is no pleural  effusion or pneumothorax. Abdomen: There is a nonobstructive bowel gas pattern. There is no free intraperitoneal air. There is no gross organomegaly or abnormal soft tissue calcification. Right upper quadrant surgical clips are noted. There is no acute osseous abnormality. Cervical spine fusion hardware is partially imaged. IMPRESSION: No acute finding in the chest or abdomen. Electronically Signed   By: Lesia Hausen M.D.   On: 06/22/2022 11:06   CT ABDOMEN PELVIS W CONTRAST  Result Date: 06/19/2022 CLINICAL DATA:  Left lower quadrant pain EXAM: CT ABDOMEN AND PELVIS WITH CONTRAST TECHNIQUE: Multidetector CT imaging of the abdomen and pelvis was performed using the standard protocol following bolus administration of intravenous contrast. RADIATION DOSE REDUCTION: This exam was performed according to the departmental dose-optimization program which includes automated exposure control, adjustment of the mA and/or kV according to patient size and/or use of iterative reconstruction technique. CONTRAST:  OMNIPAQUE IOHEXOL 300 MG/ML  SOLN COMPARISON:  CT abdomen and pelvis 02/06/2022 FINDINGS: Lower chest: No acute abnormality. There is a new 4 mm left lower lobe ground-glass nodule image 4/16. Hepatobiliary: No focal liver abnormality is seen. Status post cholecystectomy. No biliary dilatation. Pancreas: Unremarkable. No pancreatic ductal dilatation or surrounding inflammatory changes. Spleen: Normal in size without focal abnormality. Adrenals/Urinary Tract: Adrenal glands are unremarkable. Kidneys are normal, without renal calculi, focal lesion, or hydronephrosis. Bladder is unremarkable. Stomach/Bowel: There is descending and sigmoid colon diverticulosis. There is wall thickening and mild inflammation of the distal descending colon most compatible with acute diverticulitis. There is a extraluminal enhancing fluid collection adjacent to the distal descending colon which is thick walled measuring 9 by 9 by 12  mm. This collection contains a small focus of air best seen on coronal image 5/28 and 2/52. There is no bowel obstruction. The appendix, small bowel and stomach are within normal limits. Vascular/Lymphatic: Aortic atherosclerosis. No enlarged abdominal or pelvic lymph nodes. Reproductive: Uterus and bilateral adnexa are unremarkable. Other: Small fat containing umbilical hernia.  No ascites. Musculoskeletal: No acute or significant osseous findings. IMPRESSION: 1. Acute diverticulitis of the distal descending colon. There is a small extraluminal enhancing fluid collection adjacent to the distal descending colon measuring 9 x 9 x 12 mm compatible with small abscess. 2. Left ground-glass pulmonary nodule measuring 4 mm. Per Fleischner Society Guidelines, no routine follow-up imaging is recommended. These guidelines do not apply to immunocompromised patients and patients with cancer. Follow up in patients with significant comorbidities as clinically warranted. For lung cancer screening, adhere to Lung-RADS guidelines. Reference: Radiology. 2017; 284(1):228-43. Aortic Atherosclerosis (ICD10-I70.0). Electronically Signed   By: Darliss Cheney M.D.   On: 06/19/2022 17:23    Microbiology: Results for orders placed or performed during the hospital encounter of 02/06/22  Urine Culture (for pregnant, neutropenic or urologic patients or patients with an indwelling urinary catheter)     Status: Abnormal   Collection Time: 02/06/22 11:45 AM   Specimen: Urine, Clean Catch  Result Value Ref Range Status   Specimen Description   Final    URINE, CLEAN CATCH Performed at [facility], [address] [place],  [place] [address]    Special Requests   Final    Normal Performed at [facility], [address] [place], [place] [address]    Culture >=100,000 COLONIES/mL ESCHERICHIA COLI (A)  Final   Report Status 02/09/2022 FINAL  Final   Organism ID, Bacteria ESCHERICHIA COLI (A)  Final       Susceptibility   Escherichia coli - MIC*    AMPICILLIN 4 SENSITIVE Sensitive     CEFAZOLIN <=4 SENSITIVE Sensitive     CEFEPIME <=0.12 SENSITIVE Sensitive     CEFTRIAXONE <=0.25 SENSITIVE Sensitive     CIPROFLOXACIN <=0.25 SENSITIVE Sensitive     GENTAMICIN <=1 SENSITIVE Sensitive     IMIPENEM <=0.25 SENSITIVE Sensitive     NITROFURANTOIN <=16 SENSITIVE Sensitive     TRIMETH/SULFA <=20 SENSITIVE Sensitive     AMPICILLIN/SULBACTAM <=2 SENSITIVE Sensitive     PIP/TAZO <=4 SENSITIVE Sensitive     * >=100,000 COLONIES/mL ESCHERICHIA COLI    Labs: CBC: Recent Labs  Lab 06/21/22 0440 06/22/22 0416 06/23/22 0429 06/24/22 0423  WBC 4.2 3.8* 5.2 5.0  NEUTROABS 2.2  --   --   --   HGB 11.2* 10.9* 12.0 11.3*  HCT 36.1 35.1* 39.3 35.5*  MCV 86.6 86.0 86.8 84.5  PLT 189 191 185 197   Basic Metabolic Panel: Recent Labs  Lab 06/21/22 0440 06/22/22 0416 06/23/22 0429 06/24/22 0423 06/26/22 0553  NA 138 139 135 137  --   K 3.9 4.4 4.2 3.6 4.0  CL 103 102 103 102  --   CO2 30 30 24 26   --   GLUCOSE 96 96 75 106*  --   BUN 6* 6* 6* 6*  --   CREATININE 0.62 0.67 0.69 0.55  --   CALCIUM 8.4* 8.5* 8.1* 7.9*  --   MG  --   --   --   --  1.7  PHOS  --   --   --   --  4.4   Liver Function Tests: Recent Labs  Lab 06/21/22 0440  AST 55*  ALT 64*  ALKPHOS 76  BILITOT 0.5  PROT 5.6*  ALBUMIN 2.9*   CBG: No results for input(s): "GLUCAP" in the last 168 hours.  Discharge time spent: greater than 30 minutes.  Signed: Marcelino Duster, MD [facility] 06/27/2022

## 2022-06-27 NOTE — Progress Notes (Signed)
Stacy Gilmore to be D/C'd Home per MD order.  Discussed prescriptions and follow up appointments with the patient. Prescriptions given to patient, medication list explained in detail. Pt verbalized understanding.  Allergies as of 06/27/2022       Reactions   Bee Venom Swelling   Penicillins Hives, Swelling   Armoracia Rusticana Ext (horseradish) Rash   Blisters the mouth   Other Rash   Ragu spaghetti sauce        Medication List     STOP taking these medications    naproxen 500 MG tablet Commonly known as: NAPROSYN       TAKE these medications    Breo Ellipta 100-25 MCG/ACT Aepb Generic drug: fluticasone furoate-vilanterol Inhale 1 puff into the lungs daily. Rinse and brush thoroughly after administration   ciprofloxacin 500 MG tablet Commonly known as: CIPRO Take 1 tablet (500 mg total) by mouth 2 (two) times daily for 5 days.   feeding supplement Liqd Take 237 mLs by mouth 3 (three) times daily between meals.   HYDROcodone-acetaminophen 5-325 MG tablet Commonly known as: NORCO/VICODIN Take 1 tablet by mouth every 6 (six) hours as needed for moderate pain.   metroNIDAZOLE 500 MG tablet Commonly known as: FLAGYL Take 1 tablet (500 mg total) by mouth every 12 (twelve) hours for 5 days.   multivitamin with minerals Tabs tablet Take 1 tablet by mouth daily.   omeprazole 20 MG capsule Commonly known as: PRILOSEC Take 1 capsule (20 mg total) by mouth daily.   ondansetron 4 MG tablet Commonly known as: ZOFRAN Take 1 tablet (4 mg total) by mouth every 6 (six) hours as needed for nausea.   polyethylene glycol powder 17 GM/SCOOP powder Commonly known as: MiraLax Take 17 g by mouth daily.   senna-docusate 8.6-50 MG tablet Commonly known as: Senokot-S Take 1 tablet by mouth at bedtime as needed for mild constipation.   Ventolin HFA 108 (90 Base) MCG/ACT inhaler Generic drug: albuterol Inhale 2 puffs into the lungs every 4 (four) hours as needed for  wheezing or shortness of breath.        Vitals:   06/27/22 0415 06/27/22 0741  BP: 125/86 (!) 140/84  Pulse: 60 (!) 51  Resp: 16 18  Temp: 97.8 F (36.6 C) 97.9 F (36.6 C)  SpO2: 95%     Skin clean, dry and intact without evidence of skin break down, no evidence of skin tears noted. IV catheter discontinued intact. Site without signs and symptoms of complications. Dressing and pressure applied. Pt denies pain at this time. No complaints noted.  An After Visit Summary was printed and given to the patient. Patient escorted via WC, and D/C home via private auto.  Nazanin Kinner C. Jilda Roche

## 2022-06-27 NOTE — TOC Progression Note (Signed)
Transition of Care [facility]) - Progression Note    Patient Details  Name: Stacy Gilmore MRN: [id number] Date of Birth: [birth year]  Transition of Care [facility]) CM/SW Contact  Chapman Fitch, RN Phone Number: 06/27/2022, 11:31 AM  Clinical Narrative:    Notified that patient requested Medications to be delivered to bedside Meds filled at [facility] pharmacy and delivered by pharmacist    Patient requesting cab voucher.  Patient states she is unable to take the bus, does not have the means to pay for a cab, and has no one available to transport.  Cab voucher provided.  Bedside RN to call. Previous rider waiver on chart       Expected Discharge Plan and Services         Expected Discharge Date: 06/27/22                                     Social Determinants of Health (SDOH) Interventions SDOH Screenings   Food Insecurity: Food Insecurity Present (06/20/2022)  Housing: Low Risk  (06/20/2022)  Transportation Needs: No Transportation Needs (06/20/2022)  Utilities: Not At Risk (06/20/2022)  Alcohol Screen: Low Risk  (03/19/2022)  Depression (PHQ2-9): High Risk (03/19/2022)  Financial Resource Strain: High Risk (03/19/2022)  Physical Activity: Inactive (03/19/2022)  Social Connections: Socially Isolated (03/19/2022)  Stress: Stress Concern Present (03/19/2022)  Tobacco Use: High Risk (06/24/2022)    Readmission Risk Interventions     No data to display

## 2022-06-27 NOTE — TOC Transition Note (Signed)
Transition of Care [facility]) - CM/SW Discharge Note   Patient Details  Name: Stacy Gilmore MRN: [id number] Date of Birth: Jul 10, 1960  Transition of Care [facility]) CM/SW Contact:  Chapman Fitch, RN Phone Number: 06/27/2022, 10:21 AM   Clinical Narrative:     MD confirmed discharge antibiotics to be oral  No TOC needs identified for discharge         Patient Goals and CMS Choice      Discharge Placement                         Discharge Plan and Services Additional resources added to the After Visit Summary for                                       Social Determinants of Health (SDOH) Interventions SDOH Screenings   Food Insecurity: Food Insecurity Present (06/20/2022)  Housing: Low Risk  (06/20/2022)  Transportation Needs: No Transportation Needs (06/20/2022)  Utilities: Not At Risk (06/20/2022)  Alcohol Screen: Low Risk  (03/19/2022)  Depression (PHQ2-9): High Risk (03/19/2022)  Financial Resource Strain: High Risk (03/19/2022)  Physical Activity: Inactive (03/19/2022)  Social Connections: Socially Isolated (03/19/2022)  Stress: Stress Concern Present (03/19/2022)  Tobacco Use: High Risk (06/24/2022)     Readmission Risk Interventions     No data to display

## 2022-07-25 ENCOUNTER — Other Ambulatory Visit: Payer: Self-pay

## 2022-07-25 ENCOUNTER — Ambulatory Visit (INDEPENDENT_AMBULATORY_CARE_PROVIDER_SITE_OTHER): Payer: Medicaid Other | Admitting: Surgery

## 2022-07-25 ENCOUNTER — Other Ambulatory Visit
Admission: RE | Admit: 2022-07-25 | Discharge: 2022-07-25 | Disposition: A | Discharge status: Home / Self Care | Payer: Medicaid Other | Source: Ambulatory Visit | Attending: Surgery | Admitting: Surgery

## 2022-07-25 ENCOUNTER — Encounter: Payer: Self-pay | Admitting: Surgery

## 2022-07-25 VITALS — BP 112/71 | HR 79 | Temp 98.0°F | Ht 66.5 in | Wt 147.0 lb

## 2022-07-25 DIAGNOSIS — K5732 Diverticulitis of large intestine without perforation or abscess without bleeding: Secondary | ICD-10-CM | POA: Diagnosis not present

## 2022-07-25 DIAGNOSIS — R1084 Generalized abdominal pain: Secondary | ICD-10-CM | POA: Insufficient documentation

## 2022-07-25 DIAGNOSIS — R1032 Left lower quadrant pain: Secondary | ICD-10-CM

## 2022-07-25 DIAGNOSIS — R197 Diarrhea, unspecified: Secondary | ICD-10-CM

## 2022-07-25 DIAGNOSIS — R300 Dysuria: Secondary | ICD-10-CM | POA: Insufficient documentation

## 2022-07-25 HISTORY — DX: Diverticulitis of large intestine without perforation or abscess without bleeding: K57.32

## 2022-07-25 LAB — COMPREHENSIVE METABOLIC PANEL
ALT: 51 U/L — ABNORMAL HIGH (ref 0–44)
AST: 51 U/L — ABNORMAL HIGH (ref 15–41)
Albumin: 4.1 g/dL (ref 3.5–5.0)
Alkaline Phosphatase: 70 U/L (ref 38–126)
Anion gap: 8 (ref 5–15)
BUN: 11 mg/dL (ref 8–23)
CO2: 26 mmol/L (ref 22–32)
Calcium: 9.3 mg/dL (ref 8.9–10.3)
Chloride: 104 mmol/L (ref 98–111)
Creatinine, Ser: 0.52 mg/dL (ref 0.44–1.00)
GFR, Estimated: >60 mL/min (ref >60)
Glucose, Bld: 97 mg/dL (ref 70–99)
Potassium: 3.6 mmol/L (ref 3.5–5.1)
Sodium: 138 mmol/L (ref 135–145)
Total Bilirubin: 0.8 mg/dL (ref 0.3–1.2)
Total Protein: 7.4 g/dL (ref 6.5–8.1)

## 2022-07-25 LAB — CBC WITH DIFFERENTIAL/PLATELET
Abs Immature Granulocytes: 0.01 10*3/uL (ref 0.00–0.07)
Basophils Absolute: 0 10*3/uL (ref 0.0–0.1)
Basophils Relative: 1 %
Eosinophils Absolute: 0.1 10*3/uL (ref 0.0–0.5)
Eosinophils Relative: 2 %
HCT: 41.7 % (ref 36.0–46.0)
Hemoglobin: 13.7 g/dL (ref 12.0–15.0)
Immature Granulocytes: 0 %
Lymphocytes Relative: 31 %
Lymphs Abs: 1.9 10*3/uL (ref 0.7–4.0)
MCH: 27 pg (ref 26.0–34.0)
MCHC: 32.9 g/dL (ref 30.0–36.0)
MCV: 82.1 fL (ref 80.0–100.0)
Monocytes Absolute: 0.7 10*3/uL (ref 0.1–1.0)
Monocytes Relative: 12 %
Neutro Abs: 3.3 10*3/uL (ref 1.7–7.7)
Neutrophils Relative %: 54 %
Platelets: 264 10*3/uL (ref 150–400)
RBC: 5.08 MIL/uL (ref 3.87–5.11)
RDW: 14.5 % (ref 11.5–15.5)
WBC: 6 10*3/uL (ref 4.0–10.5)
nRBC: 0 % (ref 0.0–0.2)

## 2022-07-25 LAB — URINALYSIS, ROUTINE W REFLEX MICROSCOPIC
Bilirubin Urine: NEGATIVE
Glucose, UA: NEGATIVE mg/dL
Hgb urine dipstick: NEGATIVE
Ketones, ur: NEGATIVE mg/dL
Nitrite: NEGATIVE
Protein, ur: NEGATIVE mg/dL
Specific Gravity, Urine: 1.016 (ref 1.005–1.030)
pH: 5 (ref 5.0–8.0)

## 2022-07-25 MED ORDER — ONDANSETRON HCL 4 MG PO TABS
4.0000 mg | ORAL_TABLET | Freq: Four times a day (QID) | ORAL | 0 refills | Status: DC | PRN
  Filled 2022-07-25: qty 20, 5d supply, fill #0

## 2022-07-25 NOTE — Progress Notes (Signed)
Patient ID: Stacy Gilmore, female   DOB: Apr 27, 1960, 62 y.o.   MRN: [id number]  Chief Complaint: Follow-up for complicated diverticulitis  History of Present Illness Stacy Gilmore is a 62 y.o. female with well localized left lower quadrant pain with small abscess, noted on CT to be readily adjacent to the parietal peritoneum.. CT Abdomen/Pelvis which was concerning for diverticulitis with small abscess. She was admitted to medicine service.  Discharged on Cipro/Flagyl.  She reports completing her course 2 weeks ago. She reports she still having frequent liquid bowel movements about 5 to 6/day.  She denies fevers and chills.  She reports she still has her actually has recurrence of her left lower quadrant pain about 2 days ago.  She expresses that it feels like a lump like she has a hernia there. She reports nausea denies vomiting.  Denies hematochezia or melena. She denies bright red blood per rectum, but has noted some intermittent see in her voiding.  She denies pneumaturia, however notes that she has to change positions and to apply some pressure to help her void.  She has had urinary tract infections in the past.  He has not had a hysterectomy.  Past Medical History Past Medical History:  Diagnosis Date   Asthma    COPD (chronic obstructive pulmonary disease) (HCC)    GERD (gastroesophageal reflux disease)    History of diverticulitis       Past Surgical History:  Procedure Laterality Date   CERVICAL SPINE SURGERY     CHOLECYSTECTOMY     TUBAL LIGATION      Allergies  Allergen Reactions   Bee Venom Swelling   Penicillins Hives and Swelling   Armoracia Rusticana Ext (Horseradish) Rash    Blisters the mouth   Other Rash    Ragu spaghetti sauce    Current Outpatient Medications  Medication Sig Dispense Refill   albuterol (VENTOLIN HFA) 108 (90 Base) MCG/ACT inhaler Inhale 2 puffs into the lungs every 4 (four) hours as needed for wheezing or shortness of breath. 8.5 g  5   feeding supplement (ENSURE ENLIVE / ENSURE PLUS) LIQD Take 237 mLs by mouth 3 (three) times daily between meals. 237 mL 3   fluticasone furoate-vilanterol (BREO ELLIPTA) 100-25 MCG/ACT AEPB Inhale 1 puff into the lungs daily. Rinse and brush thoroughly after administration 180 each 3   HYDROcodone-acetaminophen (NORCO/VICODIN) 5-325 MG tablet Take 1 tablet by mouth every 6 (six) hours as needed for moderate pain. 10 tablet 0   Multiple Vitamin (MULTIVITAMIN WITH MINERALS) TABS tablet Take 1 tablet by mouth daily. 30 tablet 3   omeprazole (PRILOSEC) 20 MG capsule Take 1 capsule (20 mg total) by mouth daily. 90 capsule 3   polyethylene glycol powder (MIRALAX) 17 GM/SCOOP powder Take 17 g by mouth daily. 238 g 1   senna-docusate (SENOKOT-S) 8.6-50 MG tablet Take 1 tablet by mouth at bedtime as needed for mild constipation. 30 tablet 1   ondansetron (ZOFRAN) 4 MG tablet Take 1 tablet (4 mg total) by mouth every 6 (six) hours as needed for nausea. 20 tablet 0   No current facility-administered medications for this visit.    Family History Family History  Problem Relation Age of Onset   Anxiety disorder Mother    Lung cancer Mother    Thyroid disease Mother    Epilepsy Mother    Other Father        unknown medical history   Breast cancer Sister    Cervical cancer Sister  Epilepsy Brother    Other Maternal Grandmother        unknown medical history   Other Maternal Grandfather        unknown medical history   Diabetes Paternal Grandmother    Other Paternal Grandfather        unknown medical history      Social History Social History   Tobacco Use   Smoking status: Every Day    Current packs/day: 0.25    Average packs/day: 0.3 packs/day for 40.0 years (10.0 ttl pk-yrs)    Types: Cigarettes    Passive exposure: Past   Smokeless tobacco: Never   Tobacco comments:    [place] Quit info given, patient down to 2 cigarettes per day  Vaping Use   Vaping status: Never Used  Substance  Use Topics   Alcohol use: Yes    Alcohol/week: 8.0 standard drinks of alcohol    Types: 8 Cans of beer per week    Comment: 06/19/22 drank a 40oz beer a few days ago, 1/2 of a 15 pack of beer weekly, last use ~ 02/04/22   Drug use: Not Currently    Types: "Crack" cocaine, Cocaine, Methamphetamines, Marijuana    Comment: smokes Marijuana once per week-last use 01/2022, last use crack cocaine 12/08/21        Review of Systems  Constitutional: Negative.   HENT: Negative.    Eyes: Negative.   Cardiovascular:  Negative for chest pain, palpitations and leg swelling.  Gastrointestinal:  Positive for constipation, diarrhea, heartburn and nausea.  Genitourinary:  Positive for dysuria.  Skin: Negative.   Neurological:  Positive for dizziness, tingling and headaches.  Psychiatric/Behavioral: Negative.       Physical Exam Blood pressure 112/71, pulse 79, temperature 98 F (36.7 C), height 5' 6.5" (1.689 m), weight 147 lb (66.7 kg), SpO2 97%. Last Weight  Most recent update: 07/25/2022  9:09 AM    Weight  66.7 kg (147 lb)             CONSTITUTIONAL: Well developed, and nourished, appropriately responsive and aware without distress.   EYES: Sclera non-icteric.   EARS, NOSE, MOUTH AND THROAT:  The oropharynx is clear. Oral mucosa is pink and moist.    Hearing is intact to voice.  NECK: Trachea is midline, and there is no jugular venous distension.  LYMPH NODES:  Lymph nodes in the neck are not appreciated. RESPIRATORY:  Lungs are clear, and breath sounds are equal bilaterally.  Normal respiratory effort without pathologic use of accessory muscles. CARDIOVASCULAR: Heart is regular in rate and rhythm.   Well perfused.  GI: As before she has some objective tenderness for which she can be distracted.  The left lower quadrant seems to still be her focus with focal tenderness.  Otherwise the abdomen is  soft, nontender, and nondistended. There were no palpable masses.  I did not appreciate  hepatosplenomegaly.  GU: Caryl Lyn present as chaperone, she is tender in the left lower quadrant I tried to assess for hernia but was limited by her tenderness there. MUSCULOSKELETAL:  Symmetrical muscle tone appreciated in all four extremities.    SKIN: Skin turgor is normal. No pathologic skin lesions appreciated.  NEUROLOGIC:  Motor and sensation appear grossly normal.  Cranial nerves are grossly without defect. PSYCH:  Alert and oriented to person, place and time. Affect is appropriate for situation.  Data Reviewed I have personally reviewed what is currently available of the patient's imaging, recent labs and medical records.  Labs:     Latest Ref Rng & Units 07/25/2022   10:28 AM 06/24/2022    4:23 AM 06/23/2022    4:29 AM  CBC  WBC 4.0 - 10.5 K/uL 6.0  5.0  5.2   Hemoglobin 12.0 - 15.0 g/dL 16.1  09.6  04.5   Hematocrit 36.0 - 46.0 % 41.7  35.5  39.3   Platelets 150 - 400 K/uL 264  197  185       Latest Ref Rng & Units 07/25/2022   10:28 AM 06/26/2022    5:53 AM 06/24/2022    4:23 AM  CMP  Glucose 70 - 99 mg/dL 97   409   BUN 8 - 23 mg/dL 11   6   Creatinine 8.11 - 1.00 mg/dL 9.14   7.82   Sodium 956 - 145 mmol/L 138   137   Potassium 3.5 - 5.1 mmol/L 3.6  4.0  3.6   Chloride 98 - 111 mmol/L 104   102   CO2 22 - 32 mmol/L 26   26   Calcium 8.9 - 10.3 mg/dL 9.3   7.9   Total Protein 6.5 - 8.1 g/dL 7.4     Total Bilirubin 0.3 - 1.2 mg/dL 0.8     Alkaline Phos 38 - 126 U/L 70     AST 15 - 41 U/L 51     ALT 0 - 44 U/L 51         Imaging: Radiological images reviewed:  We reviewed the prior images from her previous hospitalization dating back to mid June.  I have ordered repeat CT scan examination for follow-up.  New lab results already recorded above. Within last 24 hrs: No results found.  Assessment     History of complicated diverticulitis, with additional features of nausea, and associated dysuria or urinary frequency as well as lingering diarrhea. Patient  Active Problem List   Diagnosis Date Noted   Gastroesophageal reflux disease without esophagitis 06/27/2022   Essential hypertension 06/27/2022   Protein-calorie malnutrition, moderate (HCC) 06/27/2022   Malnutrition of moderate degree 06/24/2022   Dysuria 06/19/2022   Abdominal pain 06/19/2022   Diverticulitis of intestine with abscess, recurrent 06/19/2022   Acute diverticulitis 06/19/2022   Coronary atherosclerosis 02/07/2022   Aortic atherosclerosis (HCC) 02/07/2022   Diverticulitis 02/06/2022   Diarrhea 02/06/2022   Cocaine use 02/06/2022   Generalized joint pain 01/17/2022   Health care maintenance 01/17/2022   Encounter to establish care 12/11/2021   History of asthma 12/11/2021   History of COPD 12/11/2021   History of gastroesophageal reflux (GERD) 12/11/2021   Smoking 12/11/2021    Plan    Prescription for Zofran as requested.  Labs as noted above.  Repeat CT scan pending.  I do not see a role to resume antibiotics at this time, I have not evaluated her for possible cystocele that might be causing her urinary frequency or her difficulty voiding.  She may be warranting a urological evaluation.  Will defer this until after her CT scan exam is completed.  Face-to-face time spent with the patient and accompanying care providers(if present) was 45 minutes, with more than 50% of the time spent counseling, educating, and coordinating care of the patient.    These notes generated with voice recognition software. I apologize for typographical errors.  [name] M.D., FACS 07/25/2022, 1:04 PM

## 2022-07-25 NOTE — Patient Instructions (Addendum)
We would like for you to have lab work today. You may go to [facility] entrance to have this done.   We will schedule you for a CT scan.  You are scheduled for a CT scan at [facility] on Tuesday July 16th. You will need to arrive at the [facility] entrance at 2:30 pm.  We will call you with your results and next steps.

## 2022-07-29 ENCOUNTER — Other Ambulatory Visit: Payer: Self-pay

## 2022-07-30 ENCOUNTER — Ambulatory Visit
Admission: RE | Admit: 2022-07-30 | Discharge: 2022-07-30 | Disposition: A | Discharge status: Home / Self Care | Payer: Medicaid Other | Source: Ambulatory Visit | Attending: Surgery | Admitting: Surgery

## 2022-07-30 ENCOUNTER — Ambulatory Visit: Payer: Medicaid Other

## 2022-07-30 DIAGNOSIS — K5732 Diverticulitis of large intestine without perforation or abscess without bleeding: Secondary | ICD-10-CM | POA: Insufficient documentation

## 2022-07-30 DIAGNOSIS — R1032 Left lower quadrant pain: Secondary | ICD-10-CM | POA: Diagnosis not present

## 2022-07-30 MED ORDER — IOHEXOL 300 MG/ML  SOLN
100.0000 mL | Freq: Once | INTRAMUSCULAR | Status: AC | PRN
  Administered 2022-07-30: 100 mL via INTRAVENOUS

## 2022-08-01 ENCOUNTER — Encounter: Payer: Self-pay | Admitting: Gastroenterology

## 2022-08-01 ENCOUNTER — Other Ambulatory Visit: Payer: Self-pay

## 2022-08-01 ENCOUNTER — Telehealth: Payer: Self-pay

## 2022-08-01 ENCOUNTER — Ambulatory Visit: Payer: Medicaid Other | Admitting: Gastroenterology

## 2022-08-01 ENCOUNTER — Ambulatory Visit (INDEPENDENT_AMBULATORY_CARE_PROVIDER_SITE_OTHER): Payer: Medicaid Other | Admitting: Gastroenterology

## 2022-08-01 VITALS — BP 132/83 | HR 86 | Temp 97.8°F | Ht 66.5 in | Wt 148.0 lb

## 2022-08-01 DIAGNOSIS — R1032 Left lower quadrant pain: Secondary | ICD-10-CM | POA: Diagnosis not present

## 2022-08-01 DIAGNOSIS — Z8719 Personal history of other diseases of the digestive system: Secondary | ICD-10-CM

## 2022-08-01 MED ORDER — CIPROFLOXACIN HCL 500 MG PO TABS
500.0000 mg | ORAL_TABLET | Freq: Two times a day (BID) | ORAL | 0 refills | Status: DC
  Filled 2022-08-01: qty 20, 10d supply, fill #0

## 2022-08-01 MED ORDER — METRONIDAZOLE 500 MG PO TABS
500.0000 mg | ORAL_TABLET | Freq: Two times a day (BID) | ORAL | 0 refills | Status: DC
  Filled 2022-08-01: qty 20, 10d supply, fill #0

## 2022-08-01 NOTE — Progress Notes (Unsigned)
[name] MD, MRCP(U.K) [address]  [address]  [place], [place] [address]  Main: [phone]  Fax: [phone]   Gastroenterology Consultation  Referring Provider:     Rolm Gala, NP Primary Care Physician:  Corky Downs, MD Primary Gastroenterologist:  Dr. [name]  Reason for Consultation: Diverticulitis        HPI:   Stacy Gilmore is a 62 y.o. y/o female referred for diverticulitis.  Present to the emergency room in June 2024 left lower quadrant pain abscess noted on CT scan, was admitted and discharged on 06/27/2022 in addition to concerns for diverticulitis of the descending colon treated with antibiotics for 2 weeks.  Seen by Dr. Claudine Mouton plan for repeat CT scan in which was performed in July 2024 not yet reported.  No recent colonoscopy on epic.   She has had recurrence of her left lower quadrant pain.  Seems that the pain is very similar to the pain she has had from diverticulitis denies any fevers denies any rectal bleeding denies any diarrhea feels like she requires some antibiotics been going on for over 7 days with no improvement. Past Medical History:  Diagnosis Date   Acute diverticulitis 06/19/2022   Asthma    COPD (chronic obstructive pulmonary disease) (HCC)    Diverticulitis of colon 07/25/2022   GERD (gastroesophageal reflux disease)    History of diverticulitis     Past Surgical History:  Procedure Laterality Date   CERVICAL SPINE SURGERY     CHOLECYSTECTOMY     TUBAL LIGATION      Prior to Admission medications   Medication Sig Start Date End Date Taking? Authorizing Provider  albuterol (VENTOLIN HFA) 108 (90 Base) MCG/ACT inhaler Inhale 2 puffs into the lungs every 4 (four) hours as needed for wheezing or shortness of breath. 02/14/22   Tukov-Yual, Alroy Bailiff, NP  feeding supplement (ENSURE ENLIVE / ENSURE PLUS) LIQD Take 237 mLs by mouth 3 (three) times daily between meals. 06/27/22   Marcelino Duster, MD  fluticasone  furoate-vilanterol (BREO ELLIPTA) 100-25 MCG/ACT AEPB Inhale 1 puff into the lungs daily. Rinse and brush thoroughly after administration 02/14/22   Tukov-Yual, Alroy Bailiff, NP  HYDROcodone-acetaminophen (NORCO/VICODIN) 5-325 MG tablet Take 1 tablet by mouth every 6 (six) hours as needed for moderate pain. 06/27/22   Marcelino Duster, MD  Multiple Vitamin (MULTIVITAMIN WITH MINERALS) TABS tablet Take 1 tablet by mouth daily. 06/27/22   Marcelino Duster, MD  omeprazole (PRILOSEC) 20 MG capsule Take 1 capsule (20 mg total) by mouth daily. 06/27/22   Marcelino Duster, MD  ondansetron (ZOFRAN) 4 MG tablet Take 1 tablet (4 mg total) by mouth every 6 (six) hours as needed for nausea. 07/25/22   [name], MD  polyethylene glycol powder (MIRALAX) 17 GM/SCOOP powder Take 17 g by mouth daily. 06/27/22   Marcelino Duster, MD  senna-docusate (SENOKOT-S) 8.6-50 MG tablet Take 1 tablet by mouth at bedtime as needed for mild constipation. 06/27/22   Marcelino Duster, MD    Family History  Problem Relation Age of Onset   Anxiety disorder Mother    Lung cancer Mother    Thyroid disease Mother    Epilepsy Mother    Other Father        unknown medical history   Breast cancer Sister    Cervical cancer Sister    Epilepsy Brother    Other Maternal Grandmother        unknown medical history   Other Maternal Grandfather  unknown medical history   Diabetes Paternal Grandmother    Other Paternal Grandfather        unknown medical history     Social History   Tobacco Use   Smoking status: Every Day    Current packs/day: 0.25    Average packs/day: 0.3 packs/day for 40.0 years (10.0 ttl pk-yrs)    Types: Cigarettes    Passive exposure: Past   Smokeless tobacco: Never   Tobacco comments:    [place] Quit info given, patient down to 2 cigarettes per day  Vaping Use   Vaping status: Never Used  Substance Use Topics   Alcohol use: Yes    Alcohol/week: 8.0 standard drinks of  alcohol    Types: 8 Cans of beer per week    Comment: 06/19/22 drank a 40oz beer a few days ago, 1/2 of a 15 pack of beer weekly, last use ~ 02/04/22   Drug use: Not Currently    Types: "Crack" cocaine, Cocaine, Methamphetamines, Marijuana    Comment: smokes Marijuana once per week-last use 01/2022, last use crack cocaine 12/08/21    Allergies as of 08/01/2022 - Review Complete 08/01/2022  Allergen Reaction Noted   Bee venom Swelling 04/23/2020   Penicillins Hives and Swelling 04/23/2020   Armoracia rusticana ext (horseradish) Rash 04/23/2020   Other Rash 01/17/2022    Review of Systems:    All systems reviewed and negative except where noted in HPI.   Physical Exam:  BP 132/83   Pulse 86   Temp 97.8 F (36.6 C) (Oral)   Ht 5' 6.5" (1.689 m)   Wt 148 lb (67.1 kg)   BMI 23.53 kg/m  No LMP recorded. Patient is postmenopausal. Psych:  Alert and cooperative. Normal mood and affect. General:   Alert,  Well-developed, well-nourished, pleasant and cooperative in NAD Head:  Normocephalic and atraumatic. Eyes:  Sclera clear, no icterus.   Conjunctiva pink. Ears:  Normal auditory acuity. Abdomen:  Normal bowel sounds.  No bruits.  Soft, left lower quadrant mild tenderness and non-distended without masses, hepatosplenomegaly or hernias noted.  No guarding or rebound tenderness.    Neurologic:  Alert and oriented x3;  grossly normal neurologically. Psych:  Alert and cooperative. Normal mood and affect.  Imaging Studies: No results found.  Assessment and Plan:   Stacy Gilmore is a 62 y.o. y/o female has been referred for diverticulitis.  Recent admission in June 2024 for complicated diverticulitis with abscess.  Follows with Dr. Claudine Mouton.  The plan from the surgical point of view to obtain a repeat CT scan of the abdomen which was performed on 07/30/2022 which has not yet been reported.  From the GI point of view the patient will require a colonoscopy once the abscess has healed and at  least 6 weeks from the episode of acute diverticulitis.  Presently she is having some mild to moderate tenderness in the left lower quadrant no fevers no diarrhea.  I will give her another course of ciprofloxacin and Flagyl for the diverticulitis we will call radiology to find out the results of her CT scan.  Will have a video visit with her on Monday I will CC my chart to her doctor as well as Dr. Claudine Mouton to follow-up as well  Follow up in next week video visit  Dr [name] MD,MRCP(U.K)

## 2022-08-01 NOTE — Patient Instructions (Signed)
Please start taking your antibiotics as soon as possible.   If you feel that you are getting worse, please go to the Emergency Room.

## 2022-08-01 NOTE — Telephone Encounter (Signed)
Called radiology department to be transferred to CT Department and ask if they could check for a CT scan report. I was told that since it was just done 2 days ago 07/30/2022 and not considered STAT, then it will take about 7-10 days to get it read. I was told that they currently have a radiologist shortage, therefore, we will need to wait for results. Dr. Tobi Bastos was notified.

## 2022-08-02 NOTE — Telephone Encounter (Signed)
Patient called back I explain that the nurse call to inform her that we are still waiting on the CT scan report it will take 7-10 days before Dr. Altamese Cabal can get it.

## 2022-08-05 ENCOUNTER — Ambulatory Visit: Payer: Medicaid Other | Admitting: Gastroenterology

## 2022-08-05 NOTE — Progress Notes (Deleted)
Stacy Mood MD, MRCP(U.K) [address]  [address]  [place], [place] [address]  Main: [phone]  Fax: [phone]   Primary Care Physician: Corky Downs, MD  Primary Gastroenterologist:  Dr. Wyline Gilmore   No chief complaint on file.   HPI: Stacy Gilmore is a 62 y.o. female   Summary of history : Initially referred and seen on 08/01/2022 for diverticulitis.Presented to the emergency room in June 2024 left lower quadrant pain abscess noted on CT scan, was admitted and discharged on 06/27/2022 in addition to concerns for diverticulitis of the descending colon treated with antibiotics for 2 weeks.  Seen by Dr. Claudine Mouton plan for repeat CT scan in which was performed on 07/30/2022.  On her initial visit I do not have the results of the scan hence could not discuss results with her.  At her initial visit she had recurrence of her left lower quadrant pain very similar to the episode she had with diverticulitis repeat prescription of antibiotics for 7 days was given     Interval history   08/01/2022-08/05/2022  07/30/2022: CT scan of the abdomen pelvis demonstrates persistent small area of focal soft tissue located along the anterior mesenteric border of the descending colon unchanged when compared to prior exam.  ***   Current Outpatient Medications  Medication Sig Dispense Refill   albuterol (VENTOLIN HFA) 108 (90 Base) MCG/ACT inhaler Inhale 2 puffs into the lungs every 4 (four) hours as needed for wheezing or shortness of breath. 8.5 g 5   ciprofloxacin (CIPRO) 500 MG tablet Take 1 tablet (500 mg total) by mouth 2 (two) times daily. 20 tablet 0   feeding supplement (ENSURE ENLIVE / ENSURE PLUS) LIQD Take 237 mLs by mouth 3 (three) times daily between meals. 237 mL 3   fluticasone furoate-vilanterol (BREO ELLIPTA) 100-25 MCG/ACT AEPB Inhale 1 puff into the lungs daily. Rinse and brush thoroughly after administration 180 each 3   HYDROcodone-acetaminophen (NORCO/VICODIN) 5-325  MG tablet Take 1 tablet by mouth every 6 (six) hours as needed for moderate pain. 10 tablet 0   metroNIDAZOLE (FLAGYL) 500 MG tablet Take 1 tablet (500 mg total) by mouth 2 (two) times daily. 20 tablet 0   Multiple Vitamin (MULTIVITAMIN WITH MINERALS) TABS tablet Take 1 tablet by mouth daily. 30 tablet 3   omeprazole (PRILOSEC) 20 MG capsule Take 1 capsule (20 mg total) by mouth daily. 90 capsule 3   ondansetron (ZOFRAN) 4 MG tablet Take 1 tablet (4 mg total) by mouth every 6 (six) hours as needed for nausea. 20 tablet 0   polyethylene glycol powder (MIRALAX) 17 GM/SCOOP powder Take 17 g by mouth daily. 238 g 1   senna-docusate (SENOKOT-S) 8.6-50 MG tablet Take 1 tablet by mouth at bedtime as needed for mild constipation. (Patient not taking: Reported on 08/01/2022) 30 tablet 1   No current facility-administered medications for this visit.    Allergies as of 08/05/2022 - Review Complete 08/01/2022  Allergen Reaction Noted   Bee venom Swelling 04/23/2020   Penicillins Hives and Swelling 04/23/2020   Armoracia rusticana ext (horseradish) Rash 04/23/2020   Other Rash 01/17/2022       Interval history   ***/***/202*   ***/***/2024   ROS:  General: Negative for anorexia, weight loss, fever, chills, fatigue, weakness. ENT: Negative for hoarseness, difficulty swallowing , nasal congestion. CV: Negative for chest pain, angina, palpitations, dyspnea on exertion, peripheral edema.  Respiratory: Negative for dyspnea at rest, dyspnea on exertion, cough, sputum, wheezing.  GI: See history of present illness. GU:  Negative for dysuria, hematuria, urinary incontinence, urinary frequency, nocturnal urination.  Endo: Negative for unusual weight change.    Physical Examination:   There were no vitals taken for this visit.  General: Well-nourished, well-developed in no acute distress.  Eyes: No icterus. Conjunctivae pink. Mouth: Oropharyngeal mucosa moist and pink , no lesions erythema or  exudate. Lungs: Clear to auscultation bilaterally. Non-labored. Heart: Regular rate and rhythm, no murmurs rubs or gallops.  Abdomen: Bowel sounds are normal, nontender, nondistended, no hepatosplenomegaly or masses, no abdominal bruits or hernia , no rebound or guarding.   Extremities: No lower extremity edema. No clubbing or deformities. Neuro: Alert and oriented x 3.  Grossly intact. Skin: Warm and dry, no jaundice.   Psych: Alert and cooperative, normal Gilmore and affect.   Imaging Studies: CT Abdomen Pelvis W Contrast  Result Date: 08/05/2022 CLINICAL DATA:  Left lower quadrant abdominal pain EXAM: CT ABDOMEN AND PELVIS WITH CONTRAST TECHNIQUE: Multidetector CT imaging of the abdomen and pelvis was performed using the standard protocol following bolus administration of intravenous contrast. RADIATION DOSE REDUCTION: This exam was performed according to the departmental dose-optimization program which includes automated exposure control, adjustment of the mA and/or kV according to patient size and/or use of iterative reconstruction technique. CONTRAST:  OMNIPAQUE IOHEXOL 300 MG/ML  SOLN COMPARISON:  Multiple priors, most recent CT abdomen and pelvis dated June 24, 2022 FINDINGS: Lower chest: No acute abnormality. Hepatobiliary: No focal liver abnormality is seen. Status post cholecystectomy. No biliary dilatation. Pancreas: Unremarkable. No pancreatic ductal dilatation or surrounding inflammatory changes. Spleen: No splenic injury or perisplenic hematoma. Adrenals/Urinary Tract: Bilateral adrenal glands are unremarkable. No hydronephrosis or nephrolithiasis. Bladder is unremarkable. Stomach/Bowel: Persistent small area of focal soft tissue located along the anterior mesenteric border of the descending colon on series 2, image 57 measuring 2.1 x 0.8 cm, unchanged when compared with the prior exam. Normal appearance of the stomach. Vascular/Lymphatic: Aortic atherosclerosis. Enlarged periportal  lymph nodes, unchanged when compared with the prior exam. Reference node measuring 13 mm in short axis on series 2, image 27. Reproductive: Uterus and bilateral adnexa are unremarkable. Other: No abdominal wall hernia or abnormality. No abdominopelvic ascites. Musculoskeletal: No acute or significant osseous findings. IMPRESSION: 1. Decreased wall thickening of the descending colon when compared with most recent prior. Unchanged persistent small area of focal soft tissue located along the anterior mesenteric border of the descending colon, likely a small area of scarring. 2. Mildly enlarged periportal lymph nodes, unchanged when compared with multiple prior exams. 3. Aortic Atherosclerosis (ICD10-I70.0). Electronically Signed   By: Allegra Lai M.D.   On: 08/05/2022 09:39    Assessment and Plan:   Stacy Gilmore is a 62 y.o. y/o female who to follow-up for diverticulitis.Recent admission in June 2024 for complicated diverticulitis with abscess. Follows with Dr. Claudine Mouton. The plan from the surgical point of view to obtain a repeat CT scan of the abdomen which was performed on 07/30/2022 that shows a persistent area of thickening and focal soft tissue along the anterior mesenteric border of the descending colon unchanged from prior.  Likely small area of scarring.  Mildly enlarged periportal lymph nodes.  Last week since she had symptoms very similar to her diverticulitis I gave her another 7 to 10 days of antibiotics assuming this was a recurrence of the diverticulitis.    Dr Stacy Mood  MD,MRCP [facility]) Follow up in ***  BP check ***

## 2022-09-02 ENCOUNTER — Other Ambulatory Visit: Payer: Self-pay

## 2022-09-02 ENCOUNTER — Other Ambulatory Visit (HOSPITAL_COMMUNITY): Payer: Self-pay

## 2022-09-03 ENCOUNTER — Other Ambulatory Visit: Payer: Self-pay

## 2022-09-09 ENCOUNTER — Other Ambulatory Visit: Payer: Self-pay

## 2022-09-25 NOTE — Congregational Nurse Program (Signed)
  Dept: [phone]   Congregational Nurse Program Note  Date of Encounter: 09/25/2022 Client to [facility] day center reporting she had been "kicked out of her house". She reports he husband was put in jail for trespassing. They had been paying "rent" through his work with the owner. Emotional support given. Client also requested assistance with getting set up with a new PCP. [facility], but now [facility]. RN contacted [facility], verified they took her Medicaid and a new patient apt was set for 10/21 at 1:20 pm with Dr.Sarah Pardue DO. Apt card written for client. Rn to assist with medicaid transportation closer to the apt time. Client voiced understanding and appreciation for assistance. Francesco Runner BSN, RN Past Medical History: Past Medical History:  Diagnosis Date   Acute diverticulitis 06/19/2022   Asthma    COPD (chronic obstructive pulmonary disease) (HCC)    Diverticulitis of colon 07/25/2022   GERD (gastroesophageal reflux disease)    History of diverticulitis     Encounter Details:  CNP Questionnaire - 09/25/22 1035       Questionnaire   Ask client: Do you give verbal consent for me to treat you today? Yes    Student Assistance N/A    Location Patient Served  [facility]    Visit Setting with Client Organization    Patient Status Unhoused   client reports she was "kicked out of" her rental house. is currently staying at the [employer]   Client now has [place] [employer] Medicaid   Insurance/Financial Assistance Referral N/A    Medication N/A   medications provided through [facility] Provider No   New patient apt made at [facility] family practice   Screening Referrals Made N/A    Medical Referrals Made [facility]   [facility]   Medical Appointment Made [facility] PCP[facility]clinic   New patient apt made at [facility]  practice for 10/21 at 1:20 pm   Recently w/o PCP, now 1st time PCP visit completed due to CNs referral or appointment made N/A    Food N/A   has food stamps, but reports her food was in the refrigerator in the house she "got kicked out of". Will have meals provided at the [employer] N/A   will have Medicaid trnasportation for medical appointments. RN to assit with setting up when needed   Housing/Utilities N/A    Interpersonal Safety N/A    Interventions Advocate/Support;Navigate Healthcare System;Educate    Abnormal to Normal Screening Since Last CN Visit N/A    Screenings CN Performed N/A    Sent Client to Lab for: N/A    Did client attend any of the following based off CNs referral or appointments made? N/A    ED Visit Averted N/A   no ED visit needed   Life-Saving Intervention Made N/A

## 2022-09-28 ENCOUNTER — Inpatient Hospital Stay
Admission: EM | Admit: 2022-09-28 | Discharge: 2022-10-11 | DRG: 329 | Disposition: A | Discharge status: Home / Self Care | Payer: Medicaid Other | Attending: Internal Medicine | Admitting: Internal Medicine

## 2022-09-28 ENCOUNTER — Emergency Department: Payer: Medicaid Other

## 2022-09-28 ENCOUNTER — Encounter: Payer: Self-pay | Admitting: Emergency Medicine

## 2022-09-28 ENCOUNTER — Other Ambulatory Visit: Payer: Self-pay

## 2022-09-28 DIAGNOSIS — K5732 Diverticulitis of large intestine without perforation or abscess without bleeding: Secondary | ICD-10-CM | POA: Diagnosis not present

## 2022-09-28 DIAGNOSIS — J449 Chronic obstructive pulmonary disease, unspecified: Secondary | ICD-10-CM

## 2022-09-28 DIAGNOSIS — Z79899 Other long term (current) drug therapy: Secondary | ICD-10-CM | POA: Diagnosis not present

## 2022-09-28 DIAGNOSIS — Z8049 Family history of malignant neoplasm of other genital organs: Secondary | ICD-10-CM

## 2022-09-28 DIAGNOSIS — Z833 Family history of diabetes mellitus: Secondary | ICD-10-CM | POA: Diagnosis not present

## 2022-09-28 DIAGNOSIS — Z803 Family history of malignant neoplasm of breast: Secondary | ICD-10-CM

## 2022-09-28 DIAGNOSIS — J439 Emphysema, unspecified: Secondary | ICD-10-CM | POA: Diagnosis not present

## 2022-09-28 DIAGNOSIS — D72819 Decreased white blood cell count, unspecified: Secondary | ICD-10-CM | POA: Diagnosis not present

## 2022-09-28 DIAGNOSIS — J4489 Other specified chronic obstructive pulmonary disease: Secondary | ICD-10-CM | POA: Diagnosis present

## 2022-09-28 DIAGNOSIS — K5780 Diverticulitis of intestine, part unspecified, with perforation and abscess without bleeding: Secondary | ICD-10-CM | POA: Diagnosis present

## 2022-09-28 DIAGNOSIS — Z5900 Homelessness unspecified: Secondary | ICD-10-CM | POA: Diagnosis not present

## 2022-09-28 DIAGNOSIS — R1032 Left lower quadrant pain: Secondary | ICD-10-CM | POA: Diagnosis present

## 2022-09-28 DIAGNOSIS — Z5901 Sheltered homelessness: Secondary | ICD-10-CM

## 2022-09-28 DIAGNOSIS — Z9049 Acquired absence of other specified parts of digestive tract: Secondary | ICD-10-CM

## 2022-09-28 DIAGNOSIS — Z880 Allergy status to penicillin: Secondary | ICD-10-CM

## 2022-09-28 DIAGNOSIS — E876 Hypokalemia: Secondary | ICD-10-CM | POA: Diagnosis present

## 2022-09-28 DIAGNOSIS — N390 Urinary tract infection, site not specified: Secondary | ICD-10-CM | POA: Diagnosis present

## 2022-09-28 DIAGNOSIS — K632 Fistula of intestine: Secondary | ICD-10-CM | POA: Diagnosis present

## 2022-09-28 DIAGNOSIS — F17210 Nicotine dependence, cigarettes, uncomplicated: Secondary | ICD-10-CM | POA: Diagnosis present

## 2022-09-28 DIAGNOSIS — K219 Gastro-esophageal reflux disease without esophagitis: Secondary | ICD-10-CM | POA: Diagnosis present

## 2022-09-28 DIAGNOSIS — Z820 Family history of epilepsy and other diseases of the nervous system: Secondary | ICD-10-CM

## 2022-09-28 DIAGNOSIS — K659 Peritonitis, unspecified: Secondary | ICD-10-CM | POA: Diagnosis present

## 2022-09-28 DIAGNOSIS — Z8349 Family history of other endocrine, nutritional and metabolic diseases: Secondary | ICD-10-CM | POA: Diagnosis not present

## 2022-09-28 DIAGNOSIS — F419 Anxiety disorder, unspecified: Secondary | ICD-10-CM | POA: Diagnosis present

## 2022-09-28 DIAGNOSIS — Z7951 Long term (current) use of inhaled steroids: Secondary | ICD-10-CM | POA: Diagnosis not present

## 2022-09-28 DIAGNOSIS — E785 Hyperlipidemia, unspecified: Secondary | ICD-10-CM | POA: Diagnosis present

## 2022-09-28 DIAGNOSIS — K5720 Diverticulitis of large intestine with perforation and abscess without bleeding: Secondary | ICD-10-CM | POA: Diagnosis present

## 2022-09-28 DIAGNOSIS — Z801 Family history of malignant neoplasm of trachea, bronchus and lung: Secondary | ICD-10-CM | POA: Diagnosis not present

## 2022-09-28 DIAGNOSIS — Z818 Family history of other mental and behavioral disorders: Secondary | ICD-10-CM | POA: Diagnosis not present

## 2022-09-28 DIAGNOSIS — Z8719 Personal history of other diseases of the digestive system: Secondary | ICD-10-CM

## 2022-09-28 DIAGNOSIS — Z8709 Personal history of other diseases of the respiratory system: Secondary | ICD-10-CM

## 2022-09-28 HISTORY — DX: Diverticulitis of large intestine without perforation or abscess without bleeding: K57.32

## 2022-09-28 LAB — CBC
HCT: 43.9 % (ref 36.0–46.0)
Hemoglobin: 14.3 g/dL (ref 12.0–15.0)
MCH: 27.6 pg (ref 26.0–34.0)
MCHC: 32.6 g/dL (ref 30.0–36.0)
MCV: 84.7 fL (ref 80.0–100.0)
Platelets: 247 K/uL (ref 150–400)
RBC: 5.18 MIL/uL — ABNORMAL HIGH (ref 3.87–5.11)
RDW: 13.3 % (ref 11.5–15.5)
WBC: 4.2 K/uL (ref 4.0–10.5)
nRBC: 0 % (ref 0.0–0.2)

## 2022-09-28 LAB — COMPREHENSIVE METABOLIC PANEL
ALT: 54 U/L — ABNORMAL HIGH (ref 0–44)
AST: 56 U/L — ABNORMAL HIGH (ref 15–41)
Albumin: 4 g/dL (ref 3.5–5.0)
Alkaline Phosphatase: 70 U/L (ref 38–126)
Anion gap: 11 (ref 5–15)
BUN: 9 mg/dL (ref 8–23)
CO2: 24 mmol/L (ref 22–32)
Calcium: 9 mg/dL (ref 8.9–10.3)
Chloride: 101 mmol/L (ref 98–111)
Creatinine, Ser: 0.59 mg/dL (ref 0.44–1.00)
GFR, Estimated: >60 mL/min (ref >60)
Glucose, Bld: 116 mg/dL — ABNORMAL HIGH (ref 70–99)
Potassium: 3.2 mmol/L — ABNORMAL LOW (ref 3.5–5.1)
Sodium: 136 mmol/L (ref 135–145)
Total Bilirubin: 0.6 mg/dL (ref 0.3–1.2)
Total Protein: 7.4 g/dL (ref 6.5–8.1)

## 2022-09-28 LAB — LACTIC ACID, PLASMA: Lactic Acid, Venous: 0.7 mmol/L (ref 0.5–1.9)

## 2022-09-28 LAB — LIPASE, BLOOD: Lipase: 55 U/L — ABNORMAL HIGH (ref 11–51)

## 2022-09-28 MED ORDER — SODIUM CHLORIDE 0.9 % IV BOLUS
500.0000 mL | Freq: Once | INTRAVENOUS | Status: AC
  Administered 2022-09-28: 500 mL via INTRAVENOUS

## 2022-09-28 MED ORDER — ONDANSETRON HCL 4 MG/2ML IJ SOLN
4.0000 mg | INTRAMUSCULAR | Status: AC
  Administered 2022-09-28: 4 mg via INTRAVENOUS
  Filled 2022-09-28: qty 2

## 2022-09-28 MED ORDER — ADULT MULTIVITAMIN W/MINERALS CH
1.0000 | ORAL_TABLET | Freq: Every day | ORAL | Status: DC
  Administered 2022-09-28 – 2022-10-11 (×14): 1 via ORAL
  Filled 2022-09-28 (×14): qty 1

## 2022-09-28 MED ORDER — LORAZEPAM 1 MG PO TABS: 1.0000 mg | ORAL_TABLET | ORAL | Status: AC | PRN

## 2022-09-28 MED ORDER — ENOXAPARIN SODIUM 40 MG/0.4ML IJ SOSY
40.0000 mg | PREFILLED_SYRINGE | INTRAMUSCULAR | Status: DC
  Administered 2022-09-28 – 2022-10-10 (×13): 40 mg via SUBCUTANEOUS
  Filled 2022-09-28 (×13): qty 0.4

## 2022-09-28 MED ORDER — METRONIDAZOLE 500 MG/100ML IV SOLN
500.0000 mg | Freq: Once | INTRAVENOUS | Status: AC
  Administered 2022-09-28: 500 mg via INTRAVENOUS
  Filled 2022-09-28: qty 100

## 2022-09-28 MED ORDER — THIAMINE MONONITRATE 100 MG PO TABS
100.0000 mg | ORAL_TABLET | Freq: Every day | ORAL | Status: DC
  Administered 2022-09-28 – 2022-10-11 (×13): 100 mg via ORAL
  Filled 2022-09-28 (×13): qty 1

## 2022-09-28 MED ORDER — PANTOPRAZOLE SODIUM 40 MG PO TBEC
40.0000 mg | DELAYED_RELEASE_TABLET | Freq: Every day | ORAL | Status: DC
  Administered 2022-09-28 – 2022-10-11 (×14): 40 mg via ORAL
  Filled 2022-09-28 (×15): qty 1

## 2022-09-28 MED ORDER — IOHEXOL 300 MG/ML  SOLN
80.0000 mL | Freq: Once | INTRAMUSCULAR | Status: AC | PRN
  Administered 2022-09-28: 80 mL via INTRAVENOUS

## 2022-09-28 MED ORDER — POTASSIUM CHLORIDE CRYS ER 20 MEQ PO TBCR
40.0000 meq | EXTENDED_RELEASE_TABLET | Freq: Once | ORAL | Status: AC
  Administered 2022-09-28: 40 meq via ORAL
  Filled 2022-09-28: qty 2

## 2022-09-28 MED ORDER — INFLUENZA VIRUS VACC SPLIT PF (FLUZONE) 0.5 ML IM SUSY: 0.5000 mL | PREFILLED_SYRINGE | INTRAMUSCULAR | Status: DC

## 2022-09-28 MED ORDER — SENNOSIDES-DOCUSATE SODIUM 8.6-50 MG PO TABS: 1.0000 | ORAL_TABLET | Freq: Every evening | ORAL | Status: DC | PRN

## 2022-09-28 MED ORDER — LORAZEPAM 2 MG/ML IJ SOLN: 1.0000 mg | INTRAMUSCULAR | Status: AC | PRN

## 2022-09-28 MED ORDER — CIPROFLOXACIN IN D5W 400 MG/200ML IV SOLN
400.0000 mg | Freq: Once | INTRAVENOUS | Status: AC
  Administered 2022-09-28: 400 mg via INTRAVENOUS
  Filled 2022-09-28: qty 200

## 2022-09-28 MED ORDER — MORPHINE SULFATE (PF) 4 MG/ML IV SOLN
4.0000 mg | Freq: Once | INTRAVENOUS | Status: AC
  Administered 2022-09-28: 4 mg via INTRAVENOUS
  Filled 2022-09-28: qty 1

## 2022-09-28 MED ORDER — ONDANSETRON HCL 4 MG PO TABS
4.0000 mg | ORAL_TABLET | Freq: Four times a day (QID) | ORAL | Status: DC | PRN
  Administered 2022-10-04 – 2022-10-07 (×2): 4 mg via ORAL
  Filled 2022-09-28 (×2): qty 1

## 2022-09-28 MED ORDER — ALBUTEROL SULFATE (2.5 MG/3ML) 0.083% IN NEBU: 3.0000 mL | INHALATION_SOLUTION | RESPIRATORY_TRACT | Status: DC | PRN

## 2022-09-28 MED ORDER — METOCLOPRAMIDE HCL 5 MG/ML IJ SOLN
5.0000 mg | Freq: Once | INTRAMUSCULAR | Status: AC
  Administered 2022-09-28: 5 mg via INTRAVENOUS
  Filled 2022-09-28: qty 2

## 2022-09-28 MED ORDER — HYDROMORPHONE HCL 1 MG/ML IJ SOLN
0.5000 mg | INTRAMUSCULAR | Status: DC | PRN
  Administered 2022-09-28 – 2022-10-11 (×60): 1 mg via INTRAVENOUS
  Filled 2022-09-28 (×60): qty 1

## 2022-09-28 MED ORDER — ACETAMINOPHEN 325 MG PO TABS
650.0000 mg | ORAL_TABLET | Freq: Four times a day (QID) | ORAL | Status: DC | PRN
  Administered 2022-09-28 – 2022-10-11 (×7): 650 mg via ORAL
  Filled 2022-09-28 (×6): qty 2

## 2022-09-28 MED ORDER — METRONIDAZOLE 500 MG/100ML IV SOLN
500.0000 mg | Freq: Two times a day (BID) | INTRAVENOUS | Status: DC
  Administered 2022-09-28 – 2022-10-03 (×10): 500 mg via INTRAVENOUS
  Filled 2022-09-28 (×10): qty 100

## 2022-09-28 MED ORDER — ONDANSETRON HCL 4 MG/2ML IJ SOLN: 4.0000 mg | Freq: Once | INTRAMUSCULAR | Status: DC | PRN

## 2022-09-28 MED ORDER — ONDANSETRON HCL 4 MG/2ML IJ SOLN
4.0000 mg | Freq: Four times a day (QID) | INTRAMUSCULAR | Status: DC | PRN
  Administered 2022-09-28 – 2022-10-11 (×24): 4 mg via INTRAVENOUS
  Filled 2022-09-28 (×25): qty 2

## 2022-09-28 MED ORDER — SODIUM CHLORIDE 0.9 % IV SOLN: INTRAVENOUS | Status: AC

## 2022-09-28 MED ORDER — POLYETHYLENE GLYCOL 3350 17 G PO PACK
17.0000 g | PACK | Freq: Every day | ORAL | Status: DC
  Administered 2022-10-03: 17 g via ORAL
  Filled 2022-09-28 (×5): qty 1

## 2022-09-28 MED ORDER — FOLIC ACID 1 MG PO TABS
1.0000 mg | ORAL_TABLET | Freq: Every day | ORAL | Status: DC
  Administered 2022-09-28 – 2022-10-11 (×14): 1 mg via ORAL
  Filled 2022-09-28 (×14): qty 1

## 2022-09-28 MED ORDER — THIAMINE HCL 100 MG/ML IJ SOLN
100.0000 mg | Freq: Every day | INTRAMUSCULAR | Status: DC
  Administered 2022-10-04: 100 mg via INTRAVENOUS
  Filled 2022-09-28 (×4): qty 2

## 2022-09-28 MED ORDER — SODIUM CHLORIDE 0.9 % IV SOLN
2.0000 g | INTRAVENOUS | Status: DC
  Administered 2022-09-28 – 2022-10-05 (×8): 2 g via INTRAVENOUS
  Filled 2022-09-28 (×8): qty 20

## 2022-09-28 MED ORDER — FLUTICASONE FUROATE-VILANTEROL 100-25 MCG/ACT IN AEPB
1.0000 | INHALATION_SPRAY | Freq: Every day | RESPIRATORY_TRACT | Status: DC
  Administered 2022-09-28 – 2022-10-11 (×14): 1 via RESPIRATORY_TRACT
  Filled 2022-09-28: qty 28

## 2022-09-28 MED ORDER — ACETAMINOPHEN 650 MG RE SUPP: 650.0000 mg | Freq: Four times a day (QID) | RECTAL | Status: DC | PRN

## 2022-09-28 NOTE — ED Triage Notes (Signed)
Pt via [facility] from home. Pt c/o intermittent LLQ abd pain that stared in January. States that the past 2 days the pain has worsen. Report dark tarry stool and NV this morning. Denies any urinary symptoms. Denies blood thinners. Pt is A&Ox4 and NAD

## 2022-09-28 NOTE — H&P (Signed)
History and Physical    Stacy RESTA Gilmore:[id number] DOB: [birth year] DOA: 09/28/2022  PCP: Sherlyn Hay, DO (Confirm with patient/family/NH records and if not entered, this has to be entered at [facility] point of entry) Patient coming from: Home  I have personally briefly reviewed patient's old medical records in [facility]  Chief Complaint: Belly hurts  HPI: Stacy Gilmore is a 62 y.o. female with medical history significant of recurrent diverticulitis, asthma/COPD, GERD, presented with worsening of LLQ pain.  Patient has a history of recurrent diverticulitis, first and second episode in January and May this year.  About 5 days ago patient started to have similar LLQ pain, episodic, subsided on its own.  Patient did not feel the pain significantly affected her daily life and did not come to seek medical advice.  This morning patient woke up with 10/10 sharp like pain in the lower left lower quadrant associated with 2 loose bowel movement and nausea but no vomiting she also had chills but no fever.  ED Course: Temperature 98.3 blood pressure 107/92, CT abdomen pelvis showed complicated diverticulitis with communication between distal descending colon and adjacent small bowel likely fistula formation and/or interloop abscess.  WBC 4.2, hemoglobin 14  K3.2 AST 56, ALT 54 patient was given IV bolus, morphine, Zofran, and Cipro and Flagyl in the ED  Review of Systems: As per HPI otherwise 14 point review of systems negative.    Past Medical History:  Diagnosis Date   Acute diverticulitis 06/19/2022   Asthma    COPD (chronic obstructive pulmonary disease) (HCC)    Diverticulitis of colon 07/25/2022   GERD (gastroesophageal reflux disease)    History of diverticulitis     Past Surgical History:  Procedure Laterality Date   CERVICAL SPINE SURGERY     CHOLECYSTECTOMY     TUBAL LIGATION       reports that she has been smoking cigarettes. She has a 10 pack-year smoking  history. She has been exposed to tobacco smoke. She has never used smokeless tobacco. She reports current alcohol use of about 8.0 standard drinks of alcohol per week. She reports that she does not currently use drugs after having used the following drugs: "Crack" cocaine, Cocaine, Methamphetamines, and Marijuana.  Allergies  Allergen Reactions   Bee Venom Swelling   Penicillins Hives and Swelling   Armoracia Rusticana Ext (Horseradish) Rash    Blisters the mouth   Other Rash    Ragu spaghetti sauce    Family History  Problem Relation Age of Onset   Anxiety disorder Mother    Lung cancer Mother    Thyroid disease Mother    Epilepsy Mother    Other Father        unknown medical history   Breast cancer Sister    Cervical cancer Sister    Epilepsy Brother    Other Maternal Grandmother        unknown medical history   Other Maternal Grandfather        unknown medical history   Diabetes Paternal Grandmother    Other Paternal Grandfather        unknown medical history     Prior to Admission medications   Medication Sig Start Date End Date Taking? Authorizing Provider  albuterol (VENTOLIN HFA) 108 (90 Base) MCG/ACT inhaler Inhale 2 puffs into the lungs every 4 (four) hours as needed for wheezing or shortness of breath. 02/14/22   Tukov-Yual, Alroy Bailiff, NP  ciprofloxacin (CIPRO) 500 MG tablet  Take 1 tablet (500 mg total) by mouth 2 (two) times daily. 08/01/22   [name], MD  feeding supplement (ENSURE ENLIVE / ENSURE PLUS) LIQD Take 237 mLs by mouth 3 (three) times daily between meals. 06/27/22   Marcelino Duster, MD  fluticasone furoate-vilanterol (BREO ELLIPTA) 100-25 MCG/ACT AEPB Inhale 1 puff into the lungs daily. Rinse and brush thoroughly after administration 02/14/22   Tukov-Yual, Alroy Bailiff, NP  HYDROcodone-acetaminophen (NORCO/VICODIN) 5-325 MG tablet Take 1 tablet by mouth every 6 (six) hours as needed for moderate pain. 06/27/22   Marcelino Duster, MD   metroNIDAZOLE (FLAGYL) 500 MG tablet Take 1 tablet (500 mg total) by mouth 2 (two) times daily. 08/01/22   [name], MD  Multiple Vitamin (MULTIVITAMIN WITH MINERALS) TABS tablet Take 1 tablet by mouth daily. 06/27/22   Marcelino Duster, MD  omeprazole (PRILOSEC) 20 MG capsule Take 1 capsule (20 mg total) by mouth daily. 06/27/22   Marcelino Duster, MD  ondansetron (ZOFRAN) 4 MG tablet Take 1 tablet (4 mg total) by mouth every 6 (six) hours as needed for nausea. 07/25/22   [name], MD  polyethylene glycol powder (MIRALAX) 17 GM/SCOOP powder Take 17 g by mouth daily. Mix as directed. 06/27/22   Marcelino Duster, MD  senna-docusate (SENOKOT-S) 8.6-50 MG tablet Take 1 tablet by mouth at bedtime as needed for mild constipation. Patient not taking: Reported on 08/01/2022 06/27/22   Marcelino Duster, MD    Physical Exam: Vitals:   09/28/22 0811 09/28/22 0812 09/28/22 0817  BP:  (!) 107/92   Pulse:  93   Resp:   20  Temp:   98.3 F (36.8 C)  TempSrc:   Oral  SpO2:  98%   Weight: 61.2 kg    Height: 5' 6.5" (1.689 m)      Constitutional: NAD, calm, comfortable Vitals:   09/28/22 0811 09/28/22 0812 09/28/22 0817  BP:  (!) 107/92   Pulse:  93   Resp:   20  Temp:   98.3 F (36.8 C)  TempSrc:   Oral  SpO2:  98%   Weight: 61.2 kg    Height: 5' 6.5" (1.689 m)     Eyes: PERRL, lids and conjunctivae normal ENMT: Mucous membranes are moist. Posterior pharynx clear of any exudate or lesions.Normal dentition.  Neck: normal, supple, no masses, no thyromegaly Respiratory: clear to auscultation bilaterally, no wheezing, no crackles. Normal respiratory effort. No accessory muscle use.  Cardiovascular: Regular rate and rhythm, no murmurs / rubs / gallops. No extremity edema. 2+ pedal pulses. No carotid bruits.  Abdomen: Severe tenderness on LLQ with some guarding but no rebound, no masses palpated. No hepatosplenomegaly. Bowel sounds positive.  Musculoskeletal: no clubbing  / cyanosis. No joint deformity upper and lower extremities. Good ROM, no contractures. Normal muscle tone.  Skin: no rashes, lesions, ulcers. No induration Neurologic: CN 2-12 grossly intact. Sensation intact, DTR normal. Strength 5/5 in all 4.  Psychiatric: Normal judgment and insight. Alert and oriented x 3. Normal mood.    Labs on Admission: I have personally reviewed following labs and imaging studies  CBC: Recent Labs  Lab 09/28/22 0815  WBC 4.2  HGB 14.3  HCT 43.9  MCV 84.7  PLT 247   Basic Metabolic Panel: Recent Labs  Lab 09/28/22 0815  NA 136  K 3.2*  CL 101  CO2 24  GLUCOSE 116*  BUN 9  CREATININE 0.59  CALCIUM 9.0   GFR: Estimated Creatinine Clearance: 70.5 mL/min (by C-G formula based on  SCr of 0.59 mg/dL). Liver Function Tests: Recent Labs  Lab 09/28/22 0815  AST 56*  ALT 54*  ALKPHOS 70  BILITOT 0.6  PROT 7.4  ALBUMIN 4.0   Recent Labs  Lab 09/28/22 0815  LIPASE 55*   No results for input(s): "AMMONIA" in the last 168 hours. Coagulation Profile: No results for input(s): "INR", "PROTIME" in the last 168 hours. Cardiac Enzymes: No results for input(s): "CKTOTAL", "CKMB", "CKMBINDEX", "TROPONINI" in the last 168 hours. BNP (last 3 results) No results for input(s): "PROBNP" in the last 8760 hours. HbA1C: No results for input(s): "HGBA1C" in the last 72 hours. CBG: No results for input(s): "GLUCAP" in the last 168 hours. Lipid Profile: No results for input(s): "CHOL", "HDL", "LDLCALC", "TRIG", "CHOLHDL", "LDLDIRECT" in the last 72 hours. Thyroid Function Tests: No results for input(s): "TSH", "T4TOTAL", "FREET4", "T3FREE", "THYROIDAB" in the last 72 hours. Anemia Panel: No results for input(s): "VITAMINB12", "FOLATE", "FERRITIN", "TIBC", "IRON", "RETICCTPCT" in the last 72 hours. Urine analysis:    Component Value Date/Time   COLORURINE YELLOW (A) 07/25/2022 1028   APPEARANCEUR HAZY (A) 07/25/2022 1028   APPEARANCEUR Cloudy (A)  01/17/2022 1348   LABSPEC 1.016 07/25/2022 1028   LABSPEC 1.009 04/16/2013 0735   PHURINE 5.0 07/25/2022 1028   GLUCOSEU NEGATIVE 07/25/2022 1028   GLUCOSEU Negative 04/16/2013 0735   HGBUR NEGATIVE 07/25/2022 1028   BILIRUBINUR NEGATIVE 07/25/2022 1028   BILIRUBINUR negative 06/19/2022 1225   BILIRUBINUR Negative 01/17/2022 1348   BILIRUBINUR Negative 04/16/2013 0735   KETONESUR NEGATIVE 07/25/2022 1028   PROTEINUR NEGATIVE 07/25/2022 1028   UROBILINOGEN 0.2 06/19/2022 1225   NITRITE NEGATIVE 07/25/2022 1028   LEUKOCYTESUR TRACE (A) 07/25/2022 1028   LEUKOCYTESUR 2+ 04/16/2013 0735    Radiological Exams on Admission: CT ABDOMEN PELVIS W CONTRAST  Result Date: 09/28/2022 CLINICAL DATA:  Evaluate for diverticulitis. Complains of intermittent left lower quadrant abdominal pain. EXAM: CT ABDOMEN AND PELVIS WITH CONTRAST TECHNIQUE: Multidetector CT imaging of the abdomen and pelvis was performed using the standard protocol following bolus administration of intravenous contrast. RADIATION DOSE REDUCTION: This exam was performed according to the departmental dose-optimization program which includes automated exposure control, adjustment of the mA and/or kV according to patient size and/or use of iterative reconstruction technique. CONTRAST:  80mL OMNIPAQUE IOHEXOL 300 MG/ML  SOLN COMPARISON:  07/30/2022 FINDINGS: Lower chest: No acute abnormality. Hepatobiliary: No focal liver abnormality. Hypertrophy of the lateral segment of left hepatic lobe, squaring of the caudate lobe, and widening of the falciform ligament noted. Status post cholecystectomy. Mild intrahepatic bile duct dilatation and common bile duct dilatation appears similar to previous imaging. The CBD measures 1 cm on today's exam, image 36/5. Stable from 06/19/2022. No calcified common bile duct stones noted Pancreas: Unremarkable. No pancreatic ductal dilatation or surrounding inflammatory changes. Spleen: Spleen measures 13.7 cm, image  49/5. Unchanged from previous exam. No focal splenic abnormality. Adrenals/Urinary Tract: Adrenal glands are unremarkable. Kidneys are normal, without renal calculi, focal lesion, or hydronephrosis. Bladder is unremarkable. Stomach/Bowel: Stomach appears normal. The appendix is visualized and is within normal limits. The colon is largely decompressed with mild diffuse low attenuation wall thickening and intramural fatty deposition. Mild inflammatory fat stranding is noted in the left lower quadrant of the abdomen. At the site of the previous distal descending there is signs of fistulous communication the colon to the adjacent small bowel, image 34/5 and sagittal image 86/6 and axial image 58/2. There is a low-attenuation, peripherally enhancing fluid collection involving the affected  adjacent small bowel which measures 2 cm, image 55/2. Vascular/Lymphatic: Aortic atherosclerosis. No aneurysm. Prominent upper abdominal lymph nodes are identified. Porta patent node measures 9 mm, image 24/2. No retroperitoneal adenopathy. No pelvic or inguinal adenopathy. Reproductive: Increased, bilateral parametrial vascularity is similar to previous exam. No uterine or adnexal mass. Other: No free fluid or fluid collections identified within the abdomen or pelvis. No signs of pneumoperitoneum. Musculoskeletal: No acute or significant osseous findings. IMPRESSION: 1. Persistent, mild inflammatory fat stranding within the left lower quadrant of the abdomen at the site of the previous distal descending colon diverticulitis. Signs of fistulous communication between the distal descending colon and the adjacent small bowel is noted with a low-attenuation, peripherally enhancing fluid collection involving the affected adjacent small bowel which measures 2 cm. 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 2. Mild diffuse low  attenuation wall thickening and intramural fatty deposition throughout the colon. Findings are nonspecific but can be seen in the setting of chronic inflammatory bowel disease. 3. Stable mild intrahepatic and common bile duct dilatation status post cholecystectomy. No calcified common bile duct stones noted. 4. Stable mild splenomegaly. 5. Increased, bilateral parametrial vascularity is similar to previous exam. This is a nonspecific finding but can be seen in the setting of pelvic congestion syndrome. 6.  Aortic Atherosclerosis (ICD10-I70.0). Electronically Signed   By: Signa Kell M.D.   On: 09/28/2022 09:32    EKG: None  Assessment/Plan Principal Problem:   Diverticulitis large intestine Active Problems:   Diverticulitis of intestine with abscess, recurrent  (please populate well all problems here in Problem List. (For example, if patient is on BP meds at home and you resume or decide to hold them, it is a problem that needs to be her. Same for CAD, COPD, HLD and so on)  Acute complicated diverticulitis with bowel perforation abscess formation and colon/intestine fistula formation Acute peritonitis secondary to above -General Surgeon Dr. Maia Plan consulted recommended medical management for now and the surgeon will see the patient this evening. -N.p.o., IV fluid, IV pain meds -Continue antibiotics, ceftriaxone and Flagyl -Outpatient GI follow-up for colonoscopy in 6 weeks -Other Ddx, appears that CT chest also reported signs of inflammatory bowel disease, cannot differentiate in acute phase, will treat diverticulitis first then outpatient follow-up for colonoscopy and other workup to rule out IBD.  Hypokalemia -P.o. replacement, repeat level tomorrow  COPD -No acute concerns  GERD -Stable, continue PPI     DVT prophylaxis: Lovenox Code Status: Full code Family Communication: None at bedside Disposition Plan: Patient is sick with perforated diverticulitis requiring IV  antibiotics and inpatient surgical consultation, expect more than 2 midnight hospital stay Consults called: Surgery Admission status: MedSurg admission   Emeline General MD [facility] Pager [phone]  09/28/2022, 12:25 PM

## 2022-09-28 NOTE — Code Documentation (Signed)
CODE SEPSIS - PHARMACY COMMUNICATION  **Broad Spectrum Antibiotics should be administered within 1 hour of Sepsis diagnosis**  Time Code Sepsis Called/Page Received: 6045  Antibiotics Ordered: ciprofloxacin, metronidazole  Time of 1st antibiotic administration: 1035  Additional action taken by pharmacy: none required  If necessary, Name of Provider/Nurse Contacted: N/A    Lowella Bandy ,PharmD Clinical Pharmacist  09/28/2022  9:52 AM

## 2022-09-28 NOTE — Consult Note (Signed)
SURGICAL CONSULTATION NOTE   HISTORY OF PRESENT ILLNESS (HPI):  62 y.o. female presented to [facility] ED for evaluation of pain. Patient reports she has been having left lower quadrant pain since January 2024.  She does endorses that the pain has been getting worse the last 2 days.  Pain localized to the left lower quadrant.  No pain with vision.  Pain aggravated by applying pressure.  No alleviating factors.  Patient denies any fever.  At the ED she was found with stable vital signs.  There was tenderness to palpation in the left lower quadrant.  Labs are unremarkable with white blood cell count of 4.2, no significant electrolyte disturbance, normal lactic acid.  She had a CT scan of the abdomen and pelvis that shows persistent stranding in the sigmoid colon.  This time there is a small suspected fistulous tract connecting with the small bowel with a small intraluminal abscess.  No free air or free fluid.  Upon chart review patient was admitted on June 2024.  She was evaluated by Dr. Claudine Mouton and was treated with antibiotic therapy.  After improvement of symptoms patient was discharged home.  Patient was again evaluated by Dr. Claudine Mouton on 07/25/2022.  At that time he started workup for future surgical intervention for diverticulitis including CT scan of the abdomen and pelvis and referral to GI for colonoscopy.  She was evaluated by Dr. Tobi Bastos on 08/01/2022 referred from Dr. Claudine Mouton.  Dr. Claudine Mouton also wanted the patient to see urology due to suspected cystocele.   Surgery is consulted by Dr. Chipper Herb in this context for evaluation and management of diverticulitis.  PAST MEDICAL HISTORY (PMH):  Past Medical History:  Diagnosis Date   Acute diverticulitis 06/19/2022   Asthma    COPD (chronic obstructive pulmonary disease) (HCC)    Diverticulitis of colon 07/25/2022   GERD (gastroesophageal reflux disease)    History of diverticulitis      PAST SURGICAL HISTORY (PSH):  Past Surgical History:   Procedure Laterality Date   CERVICAL SPINE SURGERY     CHOLECYSTECTOMY     TUBAL LIGATION       MEDICATIONS:  Prior to Admission medications   Medication Sig Start Date End Date Taking? Authorizing Provider  albuterol (VENTOLIN HFA) 108 (90 Base) MCG/ACT inhaler Inhale 2 puffs into the lungs every 4 (four) hours as needed for wheezing or shortness of breath. 02/14/22   Tukov-Yual, Alroy Bailiff, NP  ciprofloxacin (CIPRO) 500 MG tablet Take 1 tablet (500 mg total) by mouth 2 (two) times daily. 08/01/22   [name], MD  feeding supplement (ENSURE ENLIVE / ENSURE PLUS) LIQD Take 237 mLs by mouth 3 (three) times daily between meals. 06/27/22   Marcelino Duster, MD  fluticasone furoate-vilanterol (BREO ELLIPTA) 100-25 MCG/ACT AEPB Inhale 1 puff into the lungs daily. Rinse and brush thoroughly after administration 02/14/22   Tukov-Yual, Alroy Bailiff, NP  HYDROcodone-acetaminophen (NORCO/VICODIN) 5-325 MG tablet Take 1 tablet by mouth every 6 (six) hours as needed for moderate pain. 06/27/22   Marcelino Duster, MD  metroNIDAZOLE (FLAGYL) 500 MG tablet Take 1 tablet (500 mg total) by mouth 2 (two) times daily. 08/01/22   [name], MD  Multiple Vitamin (MULTIVITAMIN WITH MINERALS) TABS tablet Take 1 tablet by mouth daily. 06/27/22   Marcelino Duster, MD  omeprazole (PRILOSEC) 20 MG capsule Take 1 capsule (20 mg total) by mouth daily. 06/27/22   Marcelino Duster, MD  ondansetron (ZOFRAN) 4 MG tablet Take 1 tablet (4 mg total) by mouth every  6 (six) hours as needed for nausea. 07/25/22   [name], MD  polyethylene glycol powder (MIRALAX) 17 GM/SCOOP powder Take 17 g by mouth daily. Mix as directed. 06/27/22   Marcelino Duster, MD  senna-docusate (SENOKOT-S) 8.6-50 MG tablet Take 1 tablet by mouth at bedtime as needed for mild constipation. Patient not taking: Reported on 08/01/2022 06/27/22   Marcelino Duster, MD     ALLERGIES:  Allergies  Allergen Reactions   Bee Venom  Swelling   Penicillins Hives and Swelling   Armoracia Rusticana Ext (Horseradish) Rash    Blisters the mouth   Other Rash    Ragu spaghetti sauce     SOCIAL HISTORY:  Social History   Socioeconomic History   Marital status: Unknown    Spouse name: Not on file   Number of children: Not on file   Years of education: Not on file   Highest education level: Not on file  Occupational History   Not on file  Tobacco Use   Smoking status: Every Day    Current packs/day: 0.25    Average packs/day: 0.3 packs/day for 40.0 years (10.0 ttl pk-yrs)    Types: Cigarettes    Passive exposure: Past   Smokeless tobacco: Never   Tobacco comments:    [place] Quit info given, patient down to 2 cigarettes per day  Vaping Use   Vaping status: Never Used  Substance and Sexual Activity   Alcohol use: Yes    Alcohol/week: 8.0 standard drinks of alcohol    Types: 8 Cans of beer per week    Comment: 06/19/22 drank a 40oz beer a few days ago, 1/2 of a 15 pack of beer weekly, last use ~ 02/04/22   Drug use: Not Currently    Types: "Crack" cocaine, Cocaine, Methamphetamines, Marijuana    Comment: smokes Marijuana once per week-last use 01/2022, last use crack cocaine 12/08/21   Sexual activity: Not Currently  Other Topics Concern   Not on file  Social History Narrative   Not on file   Social Determinants of Health   Financial Resource Strain: High Risk (03/19/2022)   Overall Financial Resource Strain (CARDIA)    Difficulty of Paying Living Expenses: Very hard  Food Insecurity: Food Insecurity Present (06/20/2022)   Hunger Vital Sign    Worried About Running Out of Food in the Last Year: Sometimes true    Ran Out of Food in the Last Year: Sometimes true  Transportation Needs: No Transportation Needs (06/20/2022)   PRAPARE - Administrator, Civil Service (Medical): No    Lack of Transportation (Non-Medical): No  Physical Activity: Inactive (03/19/2022)   Exercise Vital Sign    Days of Exercise per  Week: 0 days    Minutes of Exercise per Session: 0 min  Stress: Stress Concern Present (03/19/2022)   [employer] of Occupational Health - Occupational Stress Questionnaire    Feeling of Stress : Very much  Social Connections: Socially Isolated (03/19/2022)   Social Connection and Isolation Panel [NHANES]    Frequency of Communication with Friends and Family: Never    Frequency of Social Gatherings with Friends and Family: Never    Attends Religious Services: Never    Database administrator or Organizations: No    Attends Banker Meetings: Not on file    Marital Status: Never married  Intimate Partner Violence: Not At Risk (06/20/2022)   Humiliation, Afraid, Rape, and Kick questionnaire    Fear of Current or  Ex-Partner: No    Emotionally Abused: No    Physically Abused: No    Sexually Abused: No      FAMILY HISTORY:  Family History  Problem Relation Age of Onset   Anxiety disorder Mother    Lung cancer Mother    Thyroid disease Mother    Epilepsy Mother    Other Father        unknown medical history   Breast cancer Sister    Cervical cancer Sister    Epilepsy Brother    Other Maternal Grandmother        unknown medical history   Other Maternal Grandfather        unknown medical history   Diabetes Paternal Grandmother    Other Paternal Grandfather        unknown medical history     REVIEW OF SYSTEMS:  Constitutional: denies weight loss, fever, chills, or sweats  Eyes: denies any other vision changes, history of eye injury  ENT: denies sore throat, hearing problems  Respiratory: denies shortness of breath, wheezing  Cardiovascular: denies chest pain, palpitations  Gastrointestinal: Positive abdominal pain, nausea and vomiting Genitourinary: denies burning with urination or urinary frequency Musculoskeletal: denies any other joint pains or cramps  Skin: denies any other rashes or skin discolorations  Neurological: denies any other headache, dizziness,  weakness  Psychiatric: denies any other depression, anxiety   All other review of systems were negative   VITAL SIGNS:  Temp:  [98 F (36.7 C)-98.3 F (36.8 C)] 98 F (36.7 C) (09/14 1300) Pulse Rate:  [93] 93 (09/14 0812) Resp:  [20] 20 (09/14 0817) BP: (107)/(92) 107/92 (09/14 0812) SpO2:  [98 %] 98 % (09/14 0812) Weight:  [61.2 kg] 61.2 kg (09/14 0811)     Height: 5' 6.5" (168.9 cm) Weight: 61.2 kg BMI (Calculated): 21.47   INTAKE/OUTPUT:  This shift: Total I/O In: 700 [IV Piggyback:700] Out: -   Last 2 shifts: @IOLAST2SHIFTS @   PHYSICAL EXAM:  Constitutional:  -- Normal body habitus  -- Awake, alert, and oriented x3  Eyes:  -- Pupils equally round and reactive to light  -- No scleral icterus  Ear, nose, and throat:  -- No jugular venous distension  Pulmonary:  -- No crackles  -- Equal breath sounds bilaterally -- Breathing non-labored at rest Cardiovascular:  -- S1, S2 present  -- No pericardial rubs Gastrointestinal:  -- Abdomen soft, tender to palpation in the left lower quadrant, non-distended, no guarding or rebound tenderness -- No abdominal masses appreciated, pulsatile or otherwise  Musculoskeletal and Integumentary:  -- Wounds: None appreciated -- Extremities: B/L UE and LE FROM, hands and feet warm, no edema  Neurologic:  -- Motor function: intact and symmetric -- Sensation: intact and symmetric   Labs:     Latest Ref Rng & Units 09/28/2022    8:15 AM 07/25/2022   10:28 AM 06/24/2022    4:23 AM  CBC  WBC 4.0 - 10.5 K/uL 4.2  6.0  5.0   Hemoglobin 12.0 - 15.0 g/dL 66.4  40.3  47.4   Hematocrit 36.0 - 46.0 % 43.9  41.7  35.5   Platelets 150 - 400 K/uL 247  264  197       Latest Ref Rng & Units 09/28/2022    8:15 AM 07/25/2022   10:28 AM 06/26/2022    5:53 AM  CMP  Glucose 70 - 99 mg/dL 259  97    BUN 8 - 23 mg/dL 9  11  Creatinine 0.44 - 1.00 mg/dL 6.96  2.95    Sodium 284 - 145 mmol/L 136  138    Potassium 3.5 - 5.1 mmol/L 3.2  3.6  4.0    Chloride 98 - 111 mmol/L 101  104    CO2 22 - 32 mmol/L 24  26    Calcium 8.9 - 10.3 mg/dL 9.0  9.3    Total Protein 6.5 - 8.1 g/dL 7.4  7.4    Total Bilirubin 0.3 - 1.2 mg/dL 0.6  0.8    Alkaline Phos 38 - 126 U/L 70  70    AST 15 - 41 U/L 56  51    ALT 0 - 44 U/L 54  51       Imaging studies: I personally evaluated the images of the CT scan.  EXAM: CT ABDOMEN AND PELVIS WITH CONTRAST   TECHNIQUE: Multidetector CT imaging of the abdomen and pelvis was performed using the standard protocol following bolus administration of intravenous contrast.   RADIATION DOSE REDUCTION: This exam was performed according to the departmental dose-optimization program which includes automated exposure control, adjustment of the mA and/or kV according to patient size and/or use of iterative reconstruction technique.   CONTRAST:  80mL OMNIPAQUE IOHEXOL 300 MG/ML  SOLN   COMPARISON:  07/30/2022   FINDINGS: Lower chest: No acute abnormality.   Hepatobiliary: No focal liver abnormality. Hypertrophy of the lateral segment of left hepatic lobe, squaring of the caudate lobe, and widening of the falciform ligament noted. Status post cholecystectomy. Mild intrahepatic bile duct dilatation and common bile duct dilatation appears similar to previous imaging. The CBD measures 1 cm on today's exam, image 36/5. Stable from 06/19/2022. No calcified common bile duct stones noted   Pancreas: Unremarkable. No pancreatic ductal dilatation or surrounding inflammatory changes.   Spleen: Spleen measures 13.7 cm, image 49/5. Unchanged from previous exam. No focal splenic abnormality.   Adrenals/Urinary Tract: Adrenal glands are unremarkable. Kidneys are normal, without renal calculi, focal lesion, or hydronephrosis. Bladder is unremarkable.   Stomach/Bowel: Stomach appears normal. The appendix is visualized and is within normal limits. The colon is largely decompressed with mild diffuse low attenuation  wall thickening and intramural fatty deposition.   Mild inflammatory fat stranding is noted in the left lower quadrant of the abdomen. At the site of the previous distal descending there is signs of fistulous communication the colon to the adjacent small bowel, image 34/5 and sagittal image 86/6 and axial image 58/2. There is a low-attenuation, peripherally enhancing fluid collection involving the affected adjacent small bowel which measures 2 cm, image 55/2.   Vascular/Lymphatic: Aortic atherosclerosis. No aneurysm. Prominent upper abdominal lymph nodes are identified. Porta patent node measures 9 mm, image 24/2. No retroperitoneal adenopathy. No pelvic or inguinal adenopathy.   Reproductive: Increased, bilateral parametrial vascularity is similar to previous exam. No uterine or adnexal mass.   Other: No free fluid or fluid collections identified within the abdomen or pelvis. No signs of pneumoperitoneum.   Musculoskeletal: No acute or significant osseous findings.   IMPRESSION: 1. Persistent, mild inflammatory fat stranding within the left lower quadrant of the abdomen at the site of the previous distal descending colon diverticulitis. Signs of fistulous communication between the distal descending colon and the adjacent small bowel is noted with a low-attenuation, peripherally enhancing fluid collection involving the affected adjacent small bowel which measures 2 cm. 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 2. Mild diffuse low attenuation wall thickening and intramural fatty deposition throughout the colon. Findings are nonspecific but can be seen in the setting of chronic inflammatory bowel disease. 3. Stable mild intrahepatic and common bile duct dilatation status post cholecystectomy. No calcified common bile duct stones noted. 4. Stable mild  splenomegaly. 5. Increased, bilateral parametrial vascularity is similar to previous exam. This is a nonspecific finding but can be seen in the setting of pelvic congestion syndrome. 6.  Aortic Atherosclerosis (ICD10-I70.0).     Electronically Signed   By: Signa Kell M.D.   On: 09/28/2022 09:32  Assessment/Plan:  62 y.o. female with acute diverticulitis, complicated by pertinent comorbidities including asthma, COPD, GERD.  Patient recurrent diverticulitis.  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  There is normal white blood cell count, normal lactic acid, normal electrolytes.  No acute abdomen.  I recommend to continue conservative management of diverticulitis with IV antibiotic therapy and bowel rest.  Hopefully this episode can also resolve like the previous 1 to be able to complete the workup for elective sigmoid colectomy as planned by Dr. Claudine Mouton, instead of needing a partial colectomy with end colostomy creation during this admission.  -Will continue to follow closely.  Gae Gallop, MD

## 2022-09-28 NOTE — Progress Notes (Signed)
Mobility Specialist - Progress Note     09/28/22 1700  Mobility  Activity Ambulated with assistance to bathroom  Assistive Device None  Distance Ambulated (ft) 10 ft  Range of Motion/Exercises Active  Activity Response Tolerated well  Mobility Referral Yes  $Mobility charge 1 Mobility   Pt resting in bed on RA upon entry. Pt STS and ambulates to bathroom SBA with no RW. Pt returned to bed and left with needs in reach.   Johnathan Hausen Mobility Specialist 09/28/22, 5:45 PM

## 2022-09-28 NOTE — Progress Notes (Signed)
[facility] following for sepsis protocol

## 2022-09-28 NOTE — ED Notes (Signed)
Pt assisted to restroom.

## 2022-09-28 NOTE — ED Notes (Signed)
Ambulated pt to bathroom. Voided 200 ml. Pt was assisted back to bed, call light within reach.

## 2022-09-28 NOTE — ED Notes (Signed)
The pt advised after her CT her left arm where she has her IV has become swollen and warm to the touch. The left lower arm was swollen and warm to the touch upon assessment. The IV was removed and the pt was offered an ice pack for the swelling.

## 2022-09-28 NOTE — ED Provider Notes (Signed)
[facility] Provider Note    Event Date/Time   First MD Initiated Contact with Patient 09/28/22 [phone]     (approximate)   History   Abdominal Pain   HPI  Stacy Gilmore is a 62 y.o. female with a history of left lower quadrant abscess and diverticulitis previously treated with antibiotics   2 days of severe acute left lower abdominal pain.  Worsening.  Now having vomiting.  Vomiting up green emesis  Reports pain severe in the left lower abdomen, reports she has had the same a couple of times due to an abscess and diverticulitis.  Following with Dr. Tobi Bastos.  Symptoms suddenly worsened about 2 days ago  No fevers no chest pain.  Loose stool including dark greenish stool for the last couple days as well  Pain severe, nausea  Physical Exam   Triage Vital Signs: ED Triage Vitals [09/28/22 0811]  Encounter Vitals Group     BP      Systolic BP Percentile      Diastolic BP Percentile      Pulse      Resp      Temp      Temp src      SpO2      Weight 135 lb (61.2 kg)     Height 5' 6.5" (1.689 m)     Head Circumference      Peak Flow      Pain Score 10     Pain Loc      Pain Education      Exclude from Growth Chart     Most recent vital signs: Vitals:   09/28/22 0812 09/28/22 0817  BP: (!) 107/92   Pulse: 93   Resp:  20  Temp:  98.3 F (36.8 C)  SpO2: 98%      General: Awake, no distress except sitting up active emesis of about 100 mL of bilious emesis CV:  Good peripheral perfusion.  Normal tones Resp:  Normal effort.  Clear bilateral Abd:  No distention.  She has notable tenderness with peritonitis present and guarding of the left lower quadrant mild discomfort throughout other areas but the left lower quadrant is severely tender Other:     ED Results / Procedures / Treatments   Labs (all labs ordered are listed, but only abnormal results are displayed) Labs Reviewed  CBC - Abnormal; Notable for the following components:       Result Value   RBC 5.18 (*)    All other components within normal limits  COMPREHENSIVE METABOLIC PANEL - Abnormal; Notable for the following components:   Potassium 3.2 (*)    Glucose, Bld 116 (*)    AST 56 (*)    ALT 54 (*)    All other components within normal limits  LIPASE, BLOOD - Abnormal; Notable for the following components:   Lipase 55 (*)    All other components within normal limits  CULTURE, BLOOD (ROUTINE X 2)  CULTURE, BLOOD (ROUTINE X 2)  GASTROINTESTINAL PANEL BY PCR, STOOL (REPLACES STOOL CULTURE)  LACTIC ACID, PLASMA     EKG     RADIOLOGY  CT ABDOMEN PELVIS W CONTRAST  Result Date: 09/28/2022 CLINICAL DATA:  Evaluate for diverticulitis. Complains of intermittent left lower quadrant abdominal pain. EXAM: CT ABDOMEN AND PELVIS WITH CONTRAST TECHNIQUE: Multidetector CT imaging of the abdomen and pelvis was performed using the standard protocol following bolus administration of intravenous contrast. RADIATION DOSE REDUCTION: This exam was  performed according to the departmental dose-optimization program which includes automated exposure control, adjustment of the mA and/or kV according to patient size and/or use of iterative reconstruction technique. CONTRAST:  80mL OMNIPAQUE IOHEXOL 300 MG/ML  SOLN COMPARISON:  07/30/2022 FINDINGS: Lower chest: No acute abnormality. Hepatobiliary: No focal liver abnormality. Hypertrophy of the lateral segment of left hepatic lobe, squaring of the caudate lobe, and widening of the falciform ligament noted. Status post cholecystectomy. Mild intrahepatic bile duct dilatation and common bile duct dilatation appears similar to previous imaging. The CBD measures 1 cm on today's exam, image 36/5. Stable from 06/19/2022. No calcified common bile duct stones noted Pancreas: Unremarkable. No pancreatic ductal dilatation or surrounding inflammatory changes. Spleen: Spleen measures 13.7 cm, image 49/5. Unchanged from previous exam. No focal splenic  abnormality. Adrenals/Urinary Tract: Adrenal glands are unremarkable. Kidneys are normal, without renal calculi, focal lesion, or hydronephrosis. Bladder is unremarkable. Stomach/Bowel: Stomach appears normal. The appendix is visualized and is within normal limits. The colon is largely decompressed with mild diffuse low attenuation wall thickening and intramural fatty deposition. Mild inflammatory fat stranding is noted in the left lower quadrant of the abdomen. At the site of the previous distal descending there is signs of fistulous communication the colon to the adjacent small bowel, image 34/5 and sagittal image 86/6 and axial image 58/2. There is a low-attenuation, peripherally enhancing fluid collection involving the affected adjacent small bowel which measures 2 cm, image 55/2. Vascular/Lymphatic: Aortic atherosclerosis. No aneurysm. Prominent upper abdominal lymph nodes are identified. Porta patent node measures 9 mm, image 24/2. No retroperitoneal adenopathy. No pelvic or inguinal adenopathy. Reproductive: Increased, bilateral parametrial vascularity is similar to previous exam. No uterine or adnexal mass. Other: No free fluid or fluid collections identified within the abdomen or pelvis. No signs of pneumoperitoneum. Musculoskeletal: No acute or significant osseous findings. IMPRESSION: 1. Persistent, mild inflammatory fat stranding within the left lower quadrant of the abdomen at the site of the previous distal descending colon diverticulitis. Signs of fistulous communication between the distal descending colon and the adjacent small bowel is noted with a low-attenuation, peripherally enhancing fluid collection involving the affected adjacent small bowel which measures 2 cm. 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 2. Mild diffuse low attenuation wall thickening and intramural fatty  deposition throughout the colon. Findings are nonspecific but can be seen in the setting of chronic inflammatory bowel disease. 3. Stable mild intrahepatic and common bile duct dilatation status post cholecystectomy. No calcified common bile duct stones noted. 4. Stable mild splenomegaly. 5. Increased, bilateral parametrial vascularity is similar to previous exam. This is a nonspecific finding but can be seen in the setting of pelvic congestion syndrome. 6.  Aortic Atherosclerosis (ICD10-I70.0). Electronically Signed   By: Signa Kell M.D.   On: 09/28/2022 09:32      PROCEDURES:  Critical Care performed: No  Procedures   MEDICATIONS ORDERED IN ED: Medications  morphine (PF) 4 MG/ML injection 4 mg (has no administration in time range)  ondansetron (ZOFRAN) injection 4 mg (has no administration in time range)  ciprofloxacin (CIPRO) IVPB 400 mg (has no administration in time range)  metroNIDAZOLE (FLAGYL) IVPB 500 mg (has no administration in time range)  sodium chloride 0.9 % bolus 500 mL (500 mLs Intravenous New Bag/Given 09/28/22 0831)  morphine (PF) 4 MG/ML injection 4 mg (4 mg Intravenous Given 09/28/22 0832)  ondansetron (ZOFRAN) injection 4 mg (4 mg  Intravenous Given 09/28/22 0832)  iohexol (OMNIPAQUE) 300 MG/ML solution 80 mL (80 mLs Intravenous Contrast Given 09/28/22 0858)     IMPRESSION / MDM / ASSESSMENT AND PLAN / ED COURSE  I reviewed the triage vital signs and the nursing notes.                              Differential diagnosis includes but is not limited to, abdominal perforation, aortic dissection, cholecystitis, appendicitis, diverticulitis, colitis, esophagitis/gastritis, kidney stone, pyelonephritis, urinary tract infection, aortic aneurysm. All are considered in decision and treatment plan. Based upon the patient's presentation and risk factors, I am quite concerned about potential acute perforation, abscess, recurrent diverticulitis or other acute left lower quadrant  pathology that is causing her to have evidence of peritonitis by exam.  She is actively vomiting as well and reports dark stool for 2 days  She has a complicated history of diverticulitis.  Morphine, antiemetics fluids ordered.  Awaiting imaging   Patient's presentation is most consistent with acute complicated illness / injury requiring diagnostic workup.   The patient is on the cardiac monitor to evaluate for evidence of arrhythmia and/or significant heart rate changes.   Clinical Course as of 09/28/22 1030  Sat Sep 28, 2022  8546 Patient's symptoms had improved quite a bit after initial morphine and Zofran, but now again having active dry heaving recurrent left lower quadrant pain with emesis small amount [MQ]    Clinical Course User Index [MQ] Sharyn Creamer, MD   CT imaging concerning for complicated diverticulitis possible fistula tract etc.  I have consulted Dr. Maia Plan, and his request is that the patient be admitted to the hospitalist service and surgery to see and evaluate today.  Discussed in consult with Dr. Chipper Herb of the hospitalist service will admit the patient.  Patient understanding of plan for admission IV antibiotics and further consultation  FINAL CLINICAL IMPRESSION(S) / ED DIAGNOSES   Final diagnoses:  LLQ pain     Rx / DC Orders   ED Discharge Orders     None        Note:  This document was prepared using Dragon voice recognition software and may include unintentional dictation errors.   Sharyn Creamer, MD 09/28/22 1030

## 2022-09-29 DIAGNOSIS — K5720 Diverticulitis of large intestine with perforation and abscess without bleeding: Secondary | ICD-10-CM | POA: Diagnosis not present

## 2022-09-29 LAB — CBC
HCT: 37.2 % (ref 36.0–46.0)
Hemoglobin: 11.9 g/dL — ABNORMAL LOW (ref 12.0–15.0)
MCH: 27.4 pg (ref 26.0–34.0)
MCHC: 32 g/dL (ref 30.0–36.0)
MCV: 85.7 fL (ref 80.0–100.0)
Platelets: 157 10*3/uL (ref 150–400)
RBC: 4.34 MIL/uL (ref 3.87–5.11)
RDW: 13.6 % (ref 11.5–15.5)
WBC: 4.1 10*3/uL (ref 4.0–10.5)
nRBC: 0 % (ref 0.0–0.2)

## 2022-09-29 LAB — COMPREHENSIVE METABOLIC PANEL
ALT: 53 U/L — ABNORMAL HIGH (ref 0–44)
AST: 56 U/L — ABNORMAL HIGH (ref 15–41)
Albumin: 3.1 g/dL — ABNORMAL LOW (ref 3.5–5.0)
Alkaline Phosphatase: 74 U/L (ref 38–126)
Anion gap: 9 (ref 5–15)
BUN: 6 mg/dL — ABNORMAL LOW (ref 8–23)
CO2: 25 mmol/L (ref 22–32)
Calcium: 7.8 mg/dL — ABNORMAL LOW (ref 8.9–10.3)
Chloride: 105 mmol/L (ref 98–111)
Creatinine, Ser: 0.57 mg/dL (ref 0.44–1.00)
GFR, Estimated: >60 mL/min (ref >60)
Glucose, Bld: 94 mg/dL (ref 70–99)
Potassium: 3.1 mmol/L — ABNORMAL LOW (ref 3.5–5.1)
Sodium: 139 mmol/L (ref 135–145)
Total Bilirubin: 0.4 mg/dL (ref 0.3–1.2)
Total Protein: 5.9 g/dL — ABNORMAL LOW (ref 6.5–8.1)

## 2022-09-29 LAB — PHOSPHORUS: Phosphorus: 3.2 mg/dL (ref 2.5–4.6)

## 2022-09-29 LAB — MAGNESIUM: Magnesium: 1.8 mg/dL (ref 1.7–2.4)

## 2022-09-29 LAB — HIV ANTIBODY (ROUTINE TESTING W REFLEX): HIV Screen 4th Generation wRfx: NONREACTIVE

## 2022-09-29 MED ORDER — POTASSIUM CHLORIDE 20 MEQ PO PACK
40.0000 meq | PACK | Freq: Once | ORAL | Status: AC
  Administered 2022-09-29: 40 meq via ORAL
  Filled 2022-09-29: qty 2

## 2022-09-29 MED ORDER — POTASSIUM CHLORIDE 10 MEQ/100ML IV SOLN
10.0000 meq | INTRAVENOUS | Status: AC
  Administered 2022-09-29 (×3): 10 meq via INTRAVENOUS
  Filled 2022-09-29 (×2): qty 100

## 2022-09-29 NOTE — Progress Notes (Signed)
Patient ID: WINDI MCFAUL, female   DOB: 06/09/1960, 62 y.o.   MRN: [id number]     SURGICAL PROGRESS NOTE   Hospital Day(s): 1.   Interval History: Patient seen and examined, no acute events or new complaints overnight. Patient reports continued having pain in the left lower quadrant.  Patient seems to be very anxious sitting down in the bed.  She was sitting down and shaking back-and-forth.  Denies any nausea or vomiting.  Endorses pain localized to the left upper quadrant.  No pain radiation.  Patient can identify any alleviating or aggravating factors.  Denies any fever.  Vital signs in last 24 hours: [min-max] current  Temp:  [97.8 F (36.6 C)-98.6 F (37 C)] 98.6 F (37 C) (09/15 0323) Pulse Rate:  [58-81] 81 (09/15 0323) Resp:  [18-21] 18 (09/15 0323) BP: (98-115)/(53-72) 109/53 (09/15 0323) SpO2:  [91 %-96 %] 91 % (09/15 0323)     Height: 5' 6.5" (168.9 cm) Weight: 61.2 kg BMI (Calculated): 21.47   Physical Exam:  Constitutional: alert, cooperative and no distress  Respiratory: breathing non-labored at rest  Cardiovascular: regular rate and sinus rhythm  Gastrointestinal: soft, tender in the left lower quadrant, and non-distended  Labs:     Latest Ref Rng & Units 09/29/2022    4:18 AM 09/28/2022    8:15 AM 07/25/2022   10:28 AM  CBC  WBC 4.0 - 10.5 K/uL 4.1  4.2  6.0   Hemoglobin 12.0 - 15.0 g/dL 46.9  62.9  52.8   Hematocrit 36.0 - 46.0 % 37.2  43.9  41.7   Platelets 150 - 400 K/uL 157  247  264       Latest Ref Rng & Units 09/29/2022    4:18 AM 09/28/2022    8:15 AM 07/25/2022   10:28 AM  CMP  Glucose 70 - 99 mg/dL 94  413  97   BUN 8 - 23 mg/dL 6  9  11    Creatinine 0.44 - 1.00 mg/dL 2.44  0.10  2.72   Sodium 135 - 145 mmol/L 139  136  138   Potassium 3.5 - 5.1 mmol/L 3.1  3.2  3.6   Chloride 98 - 111 mmol/L 105  101  104   CO2 22 - 32 mmol/L 25  24  26    Calcium 8.9 - 10.3 mg/dL 7.8  9.0  9.3   Total Protein 6.5 - 8.1 g/dL 5.9  7.4  7.4   Total Bilirubin  0.3 - 1.2 mg/dL 0.4  0.6  0.8   Alkaline Phos 38 - 126 U/L 74  70  70   AST 15 - 41 U/L 56  56  51   ALT 0 - 44 U/L 53  54  51     Imaging studies: No new pertinent imaging studies   Assessment/Plan:  62 y.o. female with acute diverticulitis, complicated by pertinent comorbidities including asthma, COPD, GERD.   -Difficult to read if this patient is improving.  The expression of the pain does not correlate with the CT scan, labs, vital signs -I think that there is an anxiety component -I discussed with patient that if she continue with severe pain in the left lower quadrant without improvement she will need to get a partial colectomy with colostomy creation -Upon discussion of the colostomy patient endorses that the pain is a little bit better -I will recommend to continue bowel rest, IV antibiotic therapy -White blood cell count is 4.1.  There has been  no fever.  No tachycardia.  Physical exam without acute abdomen but still tender to palpation in the left lower quadrant. -Will continue to follow closely.  Gae Gallop, MD

## 2022-09-29 NOTE — TOC Initial Note (Signed)
Transition of Care [facility]) - Initial/Assessment Note    Patient Details  Name: Stacy Gilmore MRN: [id number] Date of Birth: Apr 01, 1960  Transition of Care [facility]) CM/SW Contact:    Darolyn Rua, LCSW Phone Number: 09/29/2022, 1:51 PM  Clinical Narrative:                  [facility] consult for SA resources, CSW met with patient at bedside, introduced role. Patient at first declined SA resources but was agreeable for CSW to leave packet. Reports no dc needs, has PCP Jacquenette Shone and goes to [employer] she [facility].   Expected Discharge Plan: Home/Self Care Barriers to Discharge: Continued Medical Work up   Patient Goals and CMS Choice Patient states their goals for this hospitalization and ongoing recovery are:: to go home CMS Medicare.gov Compare Post Acute Care list provided to:: Patient Choice offered to / list presented to : Patient      Expected Discharge Plan and Services       Living arrangements for the past 2 months: Single Family Home                                      Prior Living Arrangements/Services Living arrangements for the past 2 months: Single Family Home Lives with:: Self                   Activities of Daily Living Home Assistive Devices/Equipment: None ADL Screening (condition at time of admission) Patient's cognitive ability adequate to safely complete daily activities?: Yes Is the patient deaf or have difficulty hearing?: No Does the patient have difficulty seeing, even when wearing glasses/contacts?: No Does the patient have difficulty concentrating, remembering, or making decisions?: No Patient able to express need for assistance with ADLs?: Yes Does the patient have difficulty dressing or bathing?: No Independently performs ADLs?: Yes (appropriate for developmental age) Does the patient have difficulty walking or climbing stairs?: No Weakness of Legs: None Weakness of Arms/Hands: None  Permission  Sought/Granted                  Emotional Assessment              Admission diagnosis:  LLQ pain [R10.32] Diverticulitis large intestine [K57.32] Patient Active Problem List   Diagnosis Date Noted   Diverticulitis large intestine 09/28/2022   Gastroesophageal reflux disease without esophagitis 06/27/2022   Essential hypertension 06/27/2022   Protein-calorie malnutrition, moderate (HCC) 06/27/2022   Malnutrition of moderate degree 06/24/2022   Dysuria 06/19/2022   Abdominal pain 06/19/2022   Diverticulitis of intestine with abscess, recurrent 06/19/2022   Coronary atherosclerosis 02/07/2022   Aortic atherosclerosis (HCC) 02/07/2022   Diarrhea 02/06/2022   Cocaine use 02/06/2022   Generalized joint pain 01/17/2022   Health care maintenance 01/17/2022   Encounter to establish care 12/11/2021   History of asthma 12/11/2021   History of COPD 12/11/2021   History of gastroesophageal reflux (GERD) 12/11/2021   Smoking 12/11/2021   PCP:  Sherlyn Hay, DO Pharmacy:   [facility] [address] [place] [address] Phone: [phone] Fax: [phone]     Social Determinants of Health (SDOH) Social History: SDOH Screenings   Food Insecurity: Food Insecurity Present (09/28/2022)  Housing: High Risk (09/28/2022)  Transportation Needs: Unmet Transportation Needs (09/28/2022)  Utilities: At Risk (09/28/2022)  Alcohol Screen: Low Risk  (  03/19/2022)  Depression (PHQ2-9): High Risk (03/19/2022)  Financial Resource Strain: High Risk (03/19/2022)  Physical Activity: Inactive (03/19/2022)  Social Connections: Socially Isolated (03/19/2022)  Stress: Stress Concern Present (03/19/2022)  Tobacco Use: High Risk (09/28/2022)   SDOH Interventions:     Readmission Risk Interventions     No data to display

## 2022-09-29 NOTE — Progress Notes (Signed)
[facility] Progress Note  Patient: Stacy Gilmore    GUY:[id number]  DOA: 09/28/2022     Date of Service: the patient was seen and examined on 09/29/2022  Chief Complaint  Patient presents with   Abdominal Pain   Brief hospital course: Stacy Gilmore is a 62 y.o. female with medical history significant of recurrent diverticulitis, asthma/COPD, GERD, presented with worsening of LLQ pain.   Patient has a history of recurrent diverticulitis, first and second episode in January and May this year.  About 5 days ago patient started to have similar LLQ pain, episodic, subsided on its own.  Patient did not feel the pain significantly affected her daily life and did not come to seek medical advice.  This morning patient woke up with 10/10 sharp like pain in the lower left lower quadrant associated with 2 loose bowel movement and nausea but no vomiting she also had chills but no fever.   ED Course: Temperature 98.3 blood pressure 107/92, CT abdomen pelvis showed complicated diverticulitis with communication between distal descending colon and adjacent small bowel likely fistula formation and/or interloop abscess.  WBC 4.2, hemoglobin 14   K3.2 AST 56, ALT 54 patient was given IV bolus, morphine, Zofran, and Cipro and Flagyl in the ED  Assessment and Plan: Acute complicated diverticulitis with bowel perforation abscess formation and colon/intestine fistula formation Acute peritonitis secondary to above -N.p.o., IV fluid, IV pain meds -Continue antibiotics, ceftriaxone and Flagyl -Outpatient GI follow-up for colonoscopy in 6 weeks -Other Ddx, appears that CT chest also reported signs of inflammatory bowel disease, cannot differentiate in acute phase, will treat diverticulitis first then outpatient follow-up for colonoscopy and other workup to rule out IBD. -General Surgeon Dr. Maia Plan consulted, Rec continue antibiotics, conservative management, no surgical intervention at this time.  Will  plan for elective sigmoid resection as an outpatient.    Hypokalemia -P.o. replacement,  repeat level tomorrow   COPD -No acute concerns   GERD -Stable, continue PPI  Body mass index is 21.46 kg/m.  Interventions:  Diet: NPO DVT Prophylaxis: Subcutaneous Lovenox   Advance goals of care discussion: Full code  Family Communication: family was present at bedside, at the time of interview.  The pt provided permission to discuss medical plan with the family. Opportunity was given to ask question and all questions were answered satisfactorily.   Disposition:  Pt is from Home, admitted with complicated diverticulitis with abscess, still on IV Abx and NPO, which precludes a safe discharge. Discharge to Home, when stable and cleared by general surgery.  Subjective: No significant events overnight, patient is having abdominal pain in the left lower quadrant 7/10, no nausea vomiting or diarrhea.  Has not had BM yet.  Denies any chest pain or palpitation, no shortness of breath.  Physical Exam: General: NAD, lying comfortably Appear in no distress, affect appropriate Eyes: PERRLA ENT: Oral Mucosa Clear, moist  Neck: no JVD,  Cardiovascular: S1 and S2 Present, no Murmur,  Respiratory: good respiratory effort, Bilateral Air entry equal and Decreased, no Crackles, no wheezes Abdomen: Bowel Sound present, Soft and LLQ tenderness,  Skin: no rashes Extremities: no Pedal edema, no calf tenderness Neurologic: without any new focal findings Gait not checked due to patient safety concerns  Vitals:   09/28/22 1644 09/28/22 2038 09/29/22 0323 09/29/22 0957  BP: 101/64 (!) 98/59 (!) 109/53 111/62  Pulse: 61 66 81 77  Resp: 18 20 18 18   Temp: 97.8 F (36.6 C) 97.8 F (36.6 C) 98.6  F (37 C) 98.4 F (36.9 C)  TempSrc:   Oral   SpO2: 95% 96% 91% 92%  Weight:      Height:        Intake/Output Summary (Last 24 hours) at 09/29/2022 1111 Last data filed at 09/28/2022 1600 Gross per 24  hour  Intake 1151.86 ml  Output --  Net 1151.86 ml   Filed Weights   09/28/22 0811  Weight: 61.2 kg    Data Reviewed: I have personally reviewed and interpreted daily labs, tele strips, imagings as discussed above. I reviewed all nursing notes, pharmacy notes, vitals, pertinent old records I have discussed plan of care as described above with RN and patient/family.  CBC: Recent Labs  Lab 09/28/22 0815 09/29/22 0418  WBC 4.2 4.1  HGB 14.3 11.9*  HCT 43.9 37.2  MCV 84.7 85.7  PLT 247 157   Basic Metabolic Panel: Recent Labs  Lab 09/28/22 0815 09/29/22 0418  NA 136 139  K 3.2* 3.1*  CL 101 105  CO2 24 25  GLUCOSE 116* 94  BUN 9 6*  CREATININE 0.59 0.57  CALCIUM 9.0 7.8*  MG  --  1.8  PHOS  --  3.2    Studies: No results found.  Scheduled Meds:  enoxaparin (LOVENOX) injection  40 mg Subcutaneous Q24H   fluticasone furoate-vilanterol  1 puff Inhalation Daily   folic acid  1 mg Oral Daily   influenza vac split trivalent PF  0.5 mL Intramuscular Tomorrow-1000   multivitamin with minerals  1 tablet Oral Daily   pantoprazole  40 mg Oral Daily   polyethylene glycol  17 g Oral Daily   thiamine  100 mg Oral Daily   Or   thiamine  100 mg Intravenous Daily   Continuous Infusions:  sodium chloride 125 mL/hr at 09/29/22 0035   cefTRIAXone (ROCEPHIN)  IV Stopped (09/28/22 2229)   metronidazole 500 mg (09/29/22 0730)   potassium chloride 10 mEq (09/29/22 1006)   PRN Meds: acetaminophen **OR** acetaminophen, albuterol, HYDROmorphone (DILAUDID) injection, LORazepam **OR** LORazepam, ondansetron **OR** ondansetron (ZOFRAN) IV, senna-docusate  Time spent: 35 minutes  Author: Gillis Santa. MD [facility] 09/29/2022 11:11 AM  To reach On-call, see care teams to locate the attending and reach out to them via [email or web address]. If 7PM-7AM, please contact night-coverage If you still have difficulty reaching the attending provider, please page the [facility] (Director on Call)  for [facility] on amion for assistance.

## 2022-09-30 DIAGNOSIS — K5720 Diverticulitis of large intestine with perforation and abscess without bleeding: Secondary | ICD-10-CM | POA: Diagnosis not present

## 2022-09-30 LAB — BASIC METABOLIC PANEL
Anion gap: 9 (ref 5–15)
BUN: 6 mg/dL — ABNORMAL LOW (ref 8–23)
CO2: 25 mmol/L (ref 22–32)
Calcium: 8 mg/dL — ABNORMAL LOW (ref 8.9–10.3)
Chloride: 101 mmol/L (ref 98–111)
Creatinine, Ser: 0.44 mg/dL (ref 0.44–1.00)
GFR, Estimated: >60 mL/min (ref >60)
Glucose, Bld: 76 mg/dL (ref 70–99)
Potassium: 3.4 mmol/L — ABNORMAL LOW (ref 3.5–5.1)
Sodium: 135 mmol/L (ref 135–145)

## 2022-09-30 LAB — CBC
HCT: 35.7 % — ABNORMAL LOW (ref 36.0–46.0)
Hemoglobin: 11.5 g/dL — ABNORMAL LOW (ref 12.0–15.0)
MCH: 27.5 pg (ref 26.0–34.0)
MCHC: 32.2 g/dL (ref 30.0–36.0)
MCV: 85.4 fL (ref 80.0–100.0)
Platelets: 166 10*3/uL (ref 150–400)
RBC: 4.18 MIL/uL (ref 3.87–5.11)
RDW: 13.4 % (ref 11.5–15.5)
WBC: 4.7 10*3/uL (ref 4.0–10.5)
nRBC: 0 % (ref 0.0–0.2)

## 2022-09-30 LAB — MAGNESIUM: Magnesium: 1.9 mg/dL (ref 1.7–2.4)

## 2022-09-30 LAB — PHOSPHORUS: Phosphorus: 3.1 mg/dL (ref 2.5–4.6)

## 2022-09-30 MED ORDER — BOOST / RESOURCE BREEZE PO LIQD CUSTOM
1.0000 | Freq: Three times a day (TID) | ORAL | Status: DC
  Administered 2022-09-30 – 2022-10-07 (×12): 1 via ORAL

## 2022-09-30 MED ORDER — POTASSIUM CHLORIDE 20 MEQ PO PACK
40.0000 meq | PACK | Freq: Once | ORAL | Status: AC
  Administered 2022-09-30: 40 meq via ORAL
  Filled 2022-09-30: qty 2

## 2022-09-30 MED ORDER — NICOTINE 21 MG/24HR TD PT24
21.0000 mg | MEDICATED_PATCH | Freq: Every day | TRANSDERMAL | Status: DC
  Administered 2022-09-30 – 2022-10-11 (×12): 21 mg via TRANSDERMAL
  Filled 2022-09-30 (×12): qty 1

## 2022-09-30 NOTE — Progress Notes (Signed)
[facility] Progress Note  Patient: Stacy Gilmore    MVH:[id number]  DOA: 09/28/2022     Date of Service: the patient was seen and examined on 09/30/2022  Chief Complaint  Patient presents with   Abdominal Pain   Brief hospital course: Stacy Gilmore is a 62 y.o. female with medical history significant of recurrent diverticulitis, asthma/COPD, GERD, presented with worsening of LLQ pain.   Patient has a history of recurrent diverticulitis, first and second episode in January and May this year.  About 5 days ago patient started to have similar LLQ pain, episodic, subsided on its own.  Patient did not feel the pain significantly affected her daily life and did not come to seek medical advice.  This morning patient woke up with 10/10 sharp like pain in the lower left lower quadrant associated with 2 loose bowel movement and nausea but no vomiting she also had chills but no fever.   ED Course: Temperature 98.3 blood pressure 107/92, CT abdomen pelvis showed complicated diverticulitis with communication between distal descending colon and adjacent small bowel likely fistula formation and/or interloop abscess.  WBC 4.2, hemoglobin 14   K3.2 AST 56, ALT 54 patient was given IV bolus, morphine, Zofran, and Cipro and Flagyl in the ED  Assessment and Plan: Acute complicated diverticulitis with bowel perforation abscess formation and colon/intestine fistula formation Acute peritonitis secondary to above - s/p IV fluid, continue IV pain meds -Continue antibiotics, ceftriaxone and Flagyl -Outpatient GI follow-up for colonoscopy in 6 weeks -Other Ddx, appears that CT chest also reported signs of inflammatory bowel disease, cannot differentiate in acute phase, will treat diverticulitis first then outpatient follow-up for colonoscopy and other workup to rule out IBD. -General Surgeon Dr. Maia Plan consulted, Rec continue antibiotics, conservative management, no surgical intervention at this time.   Will plan for elective sigmoid resection as an outpatient.   9/16 started clinical diet as per general surgery, follow for further recommendation  Hypokalemia -P.o. replacement,  repeat level tomorrow   COPD -No acute concerns   GERD -Stable, continue PPI  Body mass index is 21.46 kg/m.  Interventions:  Diet: Clear liquid diet DVT Prophylaxis: Subcutaneous Lovenox   Advance goals of care discussion: Full code  Family Communication: family was present at bedside, at the time of interview.  The pt provided permission to discuss medical plan with the family. Opportunity was given to ask question and all questions were answered satisfactorily.   Disposition:  Pt is from Home, admitted with complicated diverticulitis with abscess, still on IV Abx and NPO, which precludes a safe discharge. Discharge to Home, when stable and cleared by general surgery.  Subjective: No significant events overnight, patient still had abdominal pain 6/10, passing gas and had 1 BM which is dry, no bleeding.  Denied any nausea vomiting.  Physical Exam: General: NAD, lying comfortably Appear in no distress, affect appropriate Eyes: PERRLA ENT: Oral Mucosa Clear, moist  Neck: no JVD,  Cardiovascular: S1 and S2 Present, no Murmur,  Respiratory: good respiratory effort, Bilateral Air entry equal and Decreased, no Crackles, no wheezes Abdomen: Bowel Sound present, Soft and LLQ tenderness,  Skin: no rashes Extremities: no Pedal edema, no calf tenderness Neurologic: without any new focal findings Gait not checked due to patient safety concerns  Vitals:   09/29/22 2023 09/30/22 0349 09/30/22 0408 09/30/22 0841  BP: 102/64 99/66 (!) 97/53 100/69  Pulse: 73 67 67 71  Resp: 17 18 18 18   Temp: 98.5 F (36.9 C) 97.6  F (36.4 C) 97.7 F (36.5 C) 97.7 F (36.5 C)  TempSrc: Oral  Oral Oral  SpO2: 91% 93% 98% 92%  Weight:      Height:       No intake or output data in the 24 hours ending 09/30/22  1532  Filed Weights   09/28/22 0811  Weight: 61.2 kg    Data Reviewed: I have personally reviewed and interpreted daily labs, tele strips, imagings as discussed above. I reviewed all nursing notes, pharmacy notes, vitals, pertinent old records I have discussed plan of care as described above with RN and patient/family.  CBC: Recent Labs  Lab 09/28/22 0815 09/29/22 0418 09/30/22 0418  WBC 4.2 4.1 4.7  HGB 14.3 11.9* 11.5*  HCT 43.9 37.2 35.7*  MCV 84.7 85.7 85.4  PLT 247 157 166   Basic Metabolic Panel: Recent Labs  Lab 09/28/22 0815 09/29/22 0418 09/30/22 0418  NA 136 139 135  K 3.2* 3.1* 3.4*  CL 101 105 101  CO2 24 25 25   GLUCOSE 116* 94 76  BUN 9 6* 6*  CREATININE 0.59 0.57 0.44  CALCIUM 9.0 7.8* 8.0*  MG  --  1.8 1.9  PHOS  --  3.2 3.1    Studies: No results found.  Scheduled Meds:  enoxaparin (LOVENOX) injection  40 mg Subcutaneous Q24H   fluticasone furoate-vilanterol  1 puff Inhalation Daily   folic acid  1 mg Oral Daily   influenza vac split trivalent PF  0.5 mL Intramuscular Tomorrow-1000   multivitamin with minerals  1 tablet Oral Daily   nicotine  21 mg Transdermal Daily   pantoprazole  40 mg Oral Daily   polyethylene glycol  17 g Oral Daily   thiamine  100 mg Oral Daily   Or   thiamine  100 mg Intravenous Daily   Continuous Infusions:  cefTRIAXone (ROCEPHIN)  IV Stopped (09/29/22 2316)   metronidazole 500 mg (09/30/22 0802)   PRN Meds: acetaminophen **OR** acetaminophen, albuterol, HYDROmorphone (DILAUDID) injection, LORazepam **OR** LORazepam, ondansetron **OR** ondansetron (ZOFRAN) IV, senna-docusate  Time spent: 35 minutes  Author: Gillis Santa. MD [facility] 09/30/2022 3:32 PM  To reach On-call, see care teams to locate the attending and reach out to them via [email or web address]. If 7PM-7AM, please contact night-coverage If you still have difficulty reaching the attending provider, please page the [facility] (Director on Call) for [facility] on amion for assistance.

## 2022-09-30 NOTE — Progress Notes (Signed)
Patient ID: Stacy Gilmore, female   DOB: [birth year], 61 y.o.   MRN: [id number]     SURGICAL PROGRESS NOTE   Hospital Day(s): 2.   Interval History: Patient seen and examined, no acute events or new complaints overnight. Patient reports feeling better today. Patient endorses that pain has improved. Patient ambulating. No nausea or vomiting.   Vital signs in last 24 hours: [min-max] current  Temp:  [97.6 F (36.4 C)-98.5 F (36.9 C)] 97.7 F (36.5 C) (09/16 0841) Pulse Rate:  [67-73] 71 (09/16 0841) Resp:  [17-18] 18 (09/16 0841) BP: (97-102)/(53-69) 100/69 (09/16 0841) SpO2:  [91 %-98 %] 92 % (09/16 0841)     Height: 5' 6.5" (168.9 cm) Weight: 61.2 kg BMI (Calculated): 21.47   Physical Exam:  Constitutional: alert, cooperative and no distress  Respiratory: breathing non-labored at rest  Cardiovascular: regular rate and sinus rhythm  Gastrointestinal: soft, mild tender on LLQ, and non-distended  Labs:     Latest Ref Rng & Units 09/30/2022    4:18 AM 09/29/2022    4:18 AM 09/28/2022    8:15 AM  CBC  WBC 4.0 - 10.5 K/uL 4.7  4.1  4.2   Hemoglobin 12.0 - 15.0 g/dL 40.9  81.1  91.4   Hematocrit 36.0 - 46.0 % 35.7  37.2  43.9   Platelets 150 - 400 K/uL 166  157  247       Latest Ref Rng & Units 09/30/2022    4:18 AM 09/29/2022    4:18 AM 09/28/2022    8:15 AM  CMP  Glucose 70 - 99 mg/dL 76  94  782   BUN 8 - 23 mg/dL 6  6  9    Creatinine 0.44 - 1.00 mg/dL 9.56  2.13  0.86   Sodium 135 - 145 mmol/L 135  139  136   Potassium 3.5 - 5.1 mmol/L 3.4  3.1  3.2   Chloride 98 - 111 mmol/L 101  105  101   CO2 22 - 32 mmol/L 25  25  24    Calcium 8.9 - 10.3 mg/dL 8.0  7.8  9.0   Total Protein 6.5 - 8.1 g/dL  5.9  7.4   Total Bilirubin 0.3 - 1.2 mg/dL  0.4  0.6   Alkaline Phos 38 - 126 U/L  74  70   AST 15 - 41 U/L  56  56   ALT 0 - 44 U/L  53  54     Imaging studies: No new pertinent imaging studies   Assessment/Plan:  62 y.o. female with acute diverticulitis, complicated by  pertinent comorbidities including asthma, COPD, GERD.   -Patient doing much better today. Pain improved and patient looking more comfortable.  -Will start clear liquid diet and assess for toleration.  -Continue IV abx therapy -Encourage the patient to ambulate  Gae Gallop, MD

## 2022-09-30 NOTE — Progress Notes (Signed)
Initial Nutrition Assessment  DOCUMENTATION CODES:   Not applicable  INTERVENTION:   -Boost Breeze po TID, each supplement provides 250 kcal and 9 grams of protein  -Continue MVI with minerals daily -RD will follow for diet advancement and adjust supplement regimen as appropriate  NUTRITION DIAGNOSIS:   Increased nutrient needs related to acute illness as evidenced by estimated needs.  GOAL:   Patient will meet greater than or equal to 90% of their needs  MONITOR:   PO intake, Supplement acceptance, Diet advancement  REASON FOR ASSESSMENT:   Malnutrition Screening Tool    ASSESSMENT:   Pt with medical history significant of recurrent diverticulitis, asthma/COPD, GERD, presented with worsening of LLQ pain.  Pt admitted with acute complicated diverticulitis with bowel perforation abscess formation and colon/ intestine fistula formation as well as acute peritonitis.   9/16- advanced to clear liquid diet  Reviewed I/O's: +60 ml x 24 hours and +1.2 L since admission  Spoke with pt at bedside, who was pleasant and in good spirits today. Pt shares that she is grateful to be able to eat and consumed tea and broth during visit without difficulty. Pt joking with RD at time of visit, stating how her broth would taste better with crackers.   Pt reports good appetite. Pt shares that she currently is living in a homeless shelter and was eating more prior to living in the shelter as she no longer has a choice in what she is able to eat. Pt has access to 3 meals per day. Noted pt with no teeth, but pt denies any difficulty chewing or swallowing foods and declines offer for need for mechanically altered diet.   Reviewed wt hx; pt has experienced a 9.2% wt loss over the past 3 months, which is not significant for time frame. Pt denies any weight loss, but is unsure of UBW. Suspect some wt loss may be related to dehydration.   Discussed importance of good meal and supplement intake to  promote healing. Pt amenable to supplements.   Medications reviewed and include MVI, folic acid, miralax, and thiamine.   Labs reviewed: K: 3.4.    NUTRITION - FOCUSED PHYSICAL EXAM:  Flowsheet Row Most Recent Value  Orbital Region No depletion  Upper Arm Region No depletion  Thoracic and Lumbar Region No depletion  Buccal Region No depletion  Temple Region No depletion  Clavicle Bone Region No depletion  Clavicle and Acromion Bone Region No depletion  Scapular Bone Region No depletion  Dorsal Hand No depletion  Patellar Region No depletion  Anterior Thigh Region No depletion  Posterior Calf Region No depletion  Edema (RD Assessment) None  Hair Reviewed  Eyes Reviewed  Mouth Reviewed  Skin Reviewed  Nails Reviewed       Diet Order:   Diet Order             Diet clear liquid Fluid consistency: Thin  Diet effective now                   EDUCATION NEEDS:   Education needs have been addressed  Skin:  Skin Assessment: Reviewed RN Assessment  Last BM:  09/30/22  Height:   Ht Readings from Last 1 Encounters:  09/28/22 5' 6.5" (1.689 m)    Weight:   Wt Readings from Last 1 Encounters:  09/28/22 61.2 kg    Ideal Body Weight:  60.2 kg  BMI:  Body mass index is 21.46 kg/m.  Estimated Nutritional Needs:   Kcal:  1600-1800  Protein:  80-95 grams  Fluid:  >1.6 L    Levada Schilling, RD, LDN, CDCES Registered Dietitian II Certified Diabetes Care and Education Specialist Please refer to [facility] for RD and/or RD on-call/weekend/after hours pager

## 2022-10-01 ENCOUNTER — Ambulatory Visit: Payer: Medicaid Other | Admitting: Gastroenterology

## 2022-10-01 DIAGNOSIS — K5732 Diverticulitis of large intestine without perforation or abscess without bleeding: Secondary | ICD-10-CM

## 2022-10-01 DIAGNOSIS — J439 Emphysema, unspecified: Secondary | ICD-10-CM

## 2022-10-01 DIAGNOSIS — E876 Hypokalemia: Secondary | ICD-10-CM

## 2022-10-01 DIAGNOSIS — Z5900 Homelessness unspecified: Secondary | ICD-10-CM | POA: Diagnosis not present

## 2022-10-01 DIAGNOSIS — K5720 Diverticulitis of large intestine with perforation and abscess without bleeding: Secondary | ICD-10-CM | POA: Diagnosis not present

## 2022-10-01 LAB — BASIC METABOLIC PANEL
Anion gap: 7 (ref 5–15)
BUN: <5 mg/dL — ABNORMAL LOW (ref 8–23)
CO2: 29 mmol/L (ref 22–32)
Calcium: 8.4 mg/dL — ABNORMAL LOW (ref 8.9–10.3)
Chloride: 102 mmol/L (ref 98–111)
Creatinine, Ser: 0.44 mg/dL (ref 0.44–1.00)
GFR, Estimated: >60 mL/min (ref >60)
Glucose, Bld: 103 mg/dL — ABNORMAL HIGH (ref 70–99)
Potassium: 4.2 mmol/L (ref 3.5–5.1)
Sodium: 138 mmol/L (ref 135–145)

## 2022-10-01 LAB — CBC
HCT: 37 % (ref 36.0–46.0)
Hemoglobin: 12.1 g/dL (ref 12.0–15.0)
MCH: 27.6 pg (ref 26.0–34.0)
MCHC: 32.7 g/dL (ref 30.0–36.0)
MCV: 84.3 fL (ref 80.0–100.0)
Platelets: 158 10*3/uL (ref 150–400)
RBC: 4.39 MIL/uL (ref 3.87–5.11)
RDW: 13.1 % (ref 11.5–15.5)
WBC: 2.7 10*3/uL — ABNORMAL LOW (ref 4.0–10.5)
nRBC: 0 % (ref 0.0–0.2)

## 2022-10-01 LAB — PHOSPHORUS: Phosphorus: 3 mg/dL (ref 2.5–4.6)

## 2022-10-01 LAB — MAGNESIUM: Magnesium: 2.2 mg/dL (ref 1.7–2.4)

## 2022-10-01 MED ORDER — PROMETHAZINE (PHENERGAN) 6.25MG IN NS 50ML IVPB
6.2500 mg | Freq: Four times a day (QID) | INTRAVENOUS | Status: DC | PRN
  Administered 2022-10-01 – 2022-10-07 (×2): 6.25 mg via INTRAVENOUS
  Filled 2022-10-01 (×2): qty 50
  Filled 2022-10-01: qty 6.25
  Filled 2022-10-01 (×2): qty 50
  Filled 2022-10-01: qty 6.25

## 2022-10-01 NOTE — Progress Notes (Signed)
[facility] SURGICAL PROGRESS NOTE (cpt [phone])  Hospital Day(s): 3.   Interval History: Patient seen and examined, no acute events or new complaints overnight. Patient reports she is not having as good of a morning. She reports having some LLQ pain this AM and nausea. No vomiting. No fever, chills. She is mildly leukopenic this AM to 2.7K. Hgb to 12.1. BMP and electrolytes are pending. She is on CLD; tolerating. She is having bowel function  Review of Systems:  Constitutional: denies fever, chills  HEENT: denies cough or congestion  Respiratory: denies any shortness of breath  Cardiovascular: denies chest pain or palpitations  Gastrointestinal: + abdominal pain (LLQ); + nausea, denied emesis  Genitourinary: denies burning with urination or urinary frequency Musculoskeletal: denies pain, decreased motor or sensation  Vital signs in last 24 hours: [min-max] current  Temp:  [97.7 F (36.5 C)-97.9 F (36.6 C)] 97.9 F (36.6 C) (09/17 0418) Pulse Rate:  [55-71] 55 (09/17 0418) Resp:  [18] 18 (09/17 0418) BP: (100-111)/(58-69) 111/66 (09/17 0418) SpO2:  [92 %-97 %] 97 % (09/17 0418)     Height: 5' 6.5" (168.9 cm) Weight: 61.2 kg BMI (Calculated): 21.47   Intake/Output last 2 shifts:  09/16 0701 - 09/17 0700 In: 60 [P.O.:60] Out: -    Physical Exam:  Constitutional: alert, cooperative and no distress  HENT: normocephalic without obvious abnormality  Eyes: PERRL, EOM's grossly intact and symmetric  Respiratory: breathing non-labored at rest  Cardiovascular: regular rate and sinus rhythm  Gastrointestinal: soft, she has LLQ point tenderness which is similar to previous examination, she is non-distended, no rebound/guarding. She is certainly not peritonitic Musculoskeletal: no edema or wounds, motor and sensation grossly intact, NT    Labs:     Latest Ref Rng & Units 09/30/2022    4:18 AM 09/29/2022    4:18 AM 09/28/2022    8:15 AM  CBC  WBC 4.0 - 10.5 K/uL 4.7   4.1  4.2   Hemoglobin 12.0 - 15.0 g/dL 09.8  11.9  14.7   Hematocrit 36.0 - 46.0 % 35.7  37.2  43.9   Platelets 150 - 400 K/uL 166  157  247       Latest Ref Rng & Units 09/30/2022    4:18 AM 09/29/2022    4:18 AM 09/28/2022    8:15 AM  CMP  Glucose 70 - 99 mg/dL 76  94  829   BUN 8 - 23 mg/dL 6  6  9    Creatinine 0.44 - 1.00 mg/dL 5.62  1.30  8.65   Sodium 135 - 145 mmol/L 135  139  136   Potassium 3.5 - 5.1 mmol/L 3.4  3.1  3.2   Chloride 98 - 111 mmol/L 101  105  101   CO2 22 - 32 mmol/L 25  25  24    Calcium 8.9 - 10.3 mg/dL 8.0  7.8  9.0   Total Protein 6.5 - 8.1 g/dL  5.9  7.4   Total Bilirubin 0.3 - 1.2 mg/dL  0.4  0.6   Alkaline Phos 38 - 126 U/L  74  70   AST 15 - 41 U/L  56  56   ALT 0 - 44 U/L  53  54      Imaging studies: No new pertinent imaging studies   Assessment/Plan: (ICD-10's: K92.92) 62 y.o. 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 I do not see any reason to do so today.  - Monitor abdominal examination  - Pain control prn; antiemetics prn   - Mobilize - Further management per primary service; we will follow  - Discharge Planning; Doing okay, more pain this AM but without fever nor leukocytosis. Will likely still need 48-72 hours pending clinical progression.   All of the above findings and recommendations were discussed with the patient, and the medical team, and all of patient's questions were answered to her expressed satisfaction.  -- Lynden Oxford, PA-C [facility] 10/01/2022, 7:34  AM M-F: 7am - 4pm

## 2022-10-01 NOTE — Progress Notes (Signed)
Progress Note   Patient: Stacy Gilmore ZOX:[id number] DOB: 11-11-1960 DOA: 09/28/2022     3 DOS: the patient was seen and examined on 10/01/2022   Brief hospital course: Stacy Gilmore is a 62 y.o. female with medical history significant of recurrent diverticulitis, asthma/COPD, GERD, presented with worsening of LLQ pain.   Patient has a history of recurrent diverticulitis, first and second episode in January and May this year.  About 5 days ago patient started to have similar LLQ pain, episodic, subsided on its own.  Patient did not feel the pain significantly affected her daily life and did not come to seek medical advice.  This morning patient woke up with 10/10 sharp like pain in the lower left lower quadrant associated with 2 loose bowel movement and nausea but no vomiting she also had chills but no fever.  Assessment and Plan: Acute complicated diverticulitis with bowel perforation abscess formation and colon/intestine fistula formation Acute peritonitis secondary to above continue IV pain meds, IV antiemetics Continue ceftriaxone and Flagyl therapy. Outpatient GI follow-up for colonoscopy in 6 weeks General Surgery team advised to advance diet slowly, continue antibiotics, conservative management, no surgical intervention at this time.  Plan is for elective sigmoid resection as an outpatient.   Due to her pain, will keep her on clears for now.   Hypokalemia Resolved post supplements.   COPD No exacerbation. She is on room air. Continue bronchodilators PRN.   GERD Stable, continue PPI  Homeless TOC for discharge needs.    Subjective: Patient is seen and examined today morning. She is in pain left lower quadrant, tolerating clears, passing gas. Asks for pain meds.  Physical Exam: Vitals:   09/30/22 1654 09/30/22 2019 10/01/22 0418 10/01/22 0945  BP: (!) 101/58 102/66 111/66 118/83  Pulse: 61 64 (!) 55 (!) 54  Resp: 18 18 18 18   Temp: 97.8 F (36.6 C) 97.8 F  (36.6 C) 97.9 F (36.6 C) 98 F (36.7 C)  TempSrc:      SpO2: 95% 95% 97% 97%  Weight:      Height:       General - Middle aged Caucasian female, no apparent distress HEENT - PERRLA, EOMI, atraumatic head, non tender sinuses. Lung - Clear, rales, rhonchi, wheezes. Heart - S1, S2 heard, no murmurs, rubs, trace pedal edema. Abdomen - soft, LLQ tender, bowel sounds good. Neuro - Alert, awake and oriented x 3, non focal exam. Skin - Warm and dry. Data Reviewed:     Latest Ref Rng & Units 10/01/2022    7:03 AM 09/30/2022    4:18 AM 09/29/2022    4:18 AM  CBC  WBC 4.0 - 10.5 K/uL 2.7  4.7  4.1   Hemoglobin 12.0 - 15.0 g/dL 81.1  91.4  78.2   Hematocrit 36.0 - 46.0 % 37.0  35.7  37.2   Platelets 150 - 400 K/uL 158  166  157       Latest Ref Rng & Units 10/01/2022    7:03 AM 09/30/2022    4:18 AM 09/29/2022    4:18 AM  BMP  Glucose 70 - 99 mg/dL 956  76  94   BUN 8 - 23 mg/dL 5  6  6    Creatinine 0.44 - 1.00 mg/dL 2.13  0.86  5.78   Sodium 135 - 145 mmol/L 138  135  139   Potassium 3.5 - 5.1 mmol/L 4.2  3.4  3.1   Chloride 98 - 111 mmol/L 102  101  105   CO2 22 - 32 mmol/L 29  25  25    Calcium 8.9 - 10.3 mg/dL 8.4  8.0  7.8    No results found.   Family Communication: Homeless, understands and agrees with current care plan.  Disposition: Status is: Inpatient Remains inpatient appropriate because: advance diet slowly due ot pain.  Planned Discharge Destination: Barriers to discharge: homeless    Time spent: 40 minutes  Author: Marcelino Duster, MD 10/01/2022 3:41 PM  For on call review [email or web address].

## 2022-10-02 ENCOUNTER — Inpatient Hospital Stay: Payer: Medicaid Other

## 2022-10-02 ENCOUNTER — Other Ambulatory Visit: Payer: Self-pay

## 2022-10-02 DIAGNOSIS — K5732 Diverticulitis of large intestine without perforation or abscess without bleeding: Secondary | ICD-10-CM | POA: Diagnosis not present

## 2022-10-02 DIAGNOSIS — K5720 Diverticulitis of large intestine with perforation and abscess without bleeding: Secondary | ICD-10-CM | POA: Diagnosis not present

## 2022-10-02 DIAGNOSIS — J439 Emphysema, unspecified: Secondary | ICD-10-CM | POA: Diagnosis not present

## 2022-10-02 DIAGNOSIS — E876 Hypokalemia: Secondary | ICD-10-CM | POA: Diagnosis not present

## 2022-10-02 DIAGNOSIS — Z5900 Homelessness unspecified: Secondary | ICD-10-CM | POA: Diagnosis not present

## 2022-10-02 MED ORDER — IOHEXOL 300 MG/ML  SOLN
100.0000 mL | Freq: Once | INTRAMUSCULAR | Status: AC | PRN
  Administered 2022-10-02: 100 mL via INTRAVENOUS

## 2022-10-02 MED ORDER — IOHEXOL 9 MG/ML PO SOLN
500.0000 mL | ORAL | Status: AC
  Administered 2022-10-02 (×2): 500 mL via ORAL

## 2022-10-02 NOTE — Progress Notes (Addendum)
Nutrition Follow-up  DOCUMENTATION CODES:   Not applicable  INTERVENTION:   Recommend TPN if unable to advance pt's diet within the next 24 hrs or if surgery is anticipated.   Continue thiamine, folic acid and MVI daily   Pt at high refeed risk; recommend monitor potassium, magnesium and phosphorus labs daily until stable  Daily weights   Boost Breeze po TID, each supplement provides 250 kcal and 9 grams of protein  NUTRITION DIAGNOSIS:   Inadequate oral intake related to acute illness as evidenced by other (comment) (pt on clear liquid diet). -new diagnosis   GOAL:   Patient will meet greater than or equal to 90% of their needs -not met   MONITOR:   PO intake, Supplement acceptance, Diet advancement, Labs, Weight trends, I & O's, Skin  ASSESSMENT:   62 y/o female with h/o asthma, GERD, substance abuse, COPD, CAD and HTN who is admitted with acute diverticulitis with bowel perforation & abscess complicated by colon/intestine fistula.  Pt has remained on NPO/clear liquid diet since admission and is now without adequate nutrition for 5 days. Pt continues to have abdominal pain and nausea. Pt is having bowel function. No emesis noted. Pt is tolerating clear liquid diet and Boost Breeze. Repeat CT scan from today still pending. Would recommend TPN if unable to advance pt's diet within the next 24 hrs or if surgery is anticipated; this was discussed with PA. No plans for emergent surgical intervention at this time. Pt remains at high refeed risk. No new weight since admission; will request daily weights.   Medications reviewed and include: lovenox, folic acid, MVI, nicotine, protonix, thiamine, ceftriaxone, metronidazole   Labs reviewed: K 3.9 wnl, BUN <5(L), P 3.8 wnl, Mg 2.1 wnl Wbc- 2.3(L)  Diet Order:   Diet Order             Diet clear liquid Room service appropriate? Yes; Fluid consistency: Thin  Diet effective now                  EDUCATION NEEDS:   No  education needs have been identified at this time  Skin:  Skin Assessment: Reviewed RN Assessment  Last BM:  9/18  Height:   Ht Readings from Last 1 Encounters:  09/28/22 5' 6.5" (1.689 m)    Weight:   Wt Readings from Last 1 Encounters:  09/28/22 61.2 kg    Ideal Body Weight:  60.2 kg  BMI:  Body mass index is 21.46 kg/m.  Estimated Nutritional Needs:   Kcal:  1600-1800kcal/day  Protein:  80-90g/day  Fluid:  1.7-2.0L/day  Betsey Holiday MS, RD, LDN Please refer to [facility] for RD and/or RD on-call/weekend/after hours pager

## 2022-10-02 NOTE — Plan of Care (Signed)

## 2022-10-02 NOTE — Progress Notes (Signed)
[facility] SURGICAL PROGRESS NOTE (cpt [id number])  Hospital Day(s): 4.   Interval History: Patient seen and examined, no acute events or new complaints overnight. Patient reports she is "feeling crappy today." Still with LLQ abdominal pain, very localized. Still with nausea; no emesis. No fever, chills. She is mildly leukopenic this AM to 2.3K. Hgb to 12.1. Renal function normal with sCr - 0.50; UO - unmeasured x3. No electrolyte derangements. She is on CLD; tolerating. She is having bowel function  Review of Systems:  Constitutional: denies fever, chills  HEENT: denies cough or congestion  Respiratory: denies any shortness of breath  Cardiovascular: denies chest pain or palpitations  Gastrointestinal: + abdominal pain (LLQ); + nausea, denied emesis  Genitourinary: denies burning with urination or urinary frequency Musculoskeletal: denies pain, decreased motor or sensation  Vital signs in last 24 hours: [min-max] current  Temp:  [97.3 F (36.3 C)-98.2 F (36.8 C)] 97.3 F (36.3 C) (09/18 0426) Pulse Rate:  [53-60] 60 (09/18 0426) Resp:  [18-20] 20 (09/18 0426) BP: (97-130)/(56-83) 130/64 (09/18 0426) SpO2:  [91 %-97 %] 97 % (09/18 0426)     Height: 5' 6.5" (168.9 cm) Weight: 61.2 kg BMI (Calculated): 21.47   Intake/Output last 2 shifts:  No intake/output data recorded.   Physical Exam:  Constitutional: alert, cooperative and no distress  HENT: normocephalic without obvious abnormality  Eyes: PERRL, EOM's grossly intact and symmetric  Respiratory: breathing non-labored at rest  Cardiovascular: regular rate and sinus rhythm  Gastrointestinal: soft, she has LLQ point tenderness which is similar to previous examination, she is non-distended, no rebound/guarding. She is certainly not peritonitic Musculoskeletal: no edema or wounds, motor and sensation grossly intact, NT    Labs:     Latest Ref Rng & Units 10/02/2022    5:31 AM 10/01/2022    7:03 AM 09/30/2022    4:18  AM  CBC  WBC 4.0 - 10.5 K/uL 2.3  2.7  4.7   Hemoglobin 12.0 - 15.0 g/dL 62.1  30.8  65.7   Hematocrit 36.0 - 46.0 % 36.8  37.0  35.7   Platelets 150 - 400 K/uL 176  158  166       Latest Ref Rng & Units 10/02/2022    5:31 AM 10/01/2022    7:03 AM 09/30/2022    4:18 AM  CMP  Glucose 70 - 99 mg/dL 846  962  76   BUN 8 - 23 mg/dL <5  <5  6   Creatinine 0.44 - 1.00 mg/dL 9.52  8.41  3.24   Sodium 135 - 145 mmol/L 140  138  135   Potassium 3.5 - 5.1 mmol/L 3.9  4.2  3.4   Chloride 98 - 111 mmol/L 102  102  101   CO2 22 - 32 mmol/L 29  29  25    Calcium 8.9 - 10.3 mg/dL 8.6  8.4  8.0      Imaging studies: No new pertinent imaging studies   Assessment/Plan: (ICD-10's: K62.92) 63 y.o. 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   - Will repeat CT Abdomen/Pelvis with PO/IV contrast today given persistent pain and nausea to ensure no missed or worsening intra-abdominal process  - Would continue CLD for now given pain/nausea this AM  - No plans for emergent surgical intervention; If she continues to recover well from this insult, will continue to anticipate need for elective sigmoid colectomy as an outpatient in the future with Dr Claudine Mouton as previously planned  -  Continue IV Abx (Rocephin/Flagyl)  - Monitor abdominal examination  - Pain control prn; antiemetics prn   - Mobilize - Further management per primary service; we will follow  - Discharge Planning; Doing okay, pending repeat CT  All of the above findings and recommendations were discussed with the patient, and the medical team, and all of patient's questions were answered to her expressed satisfaction.  -- Lynden Oxford, PA-C  [facility] 10/02/2022, 7:25 AM M-F: 7am - 4pm

## 2022-10-02 NOTE — Progress Notes (Signed)
Progress Note   Patient: Stacy Gilmore YQM:[id number] DOB: February 17, 1960 DOA: 09/28/2022     4 DOS: the patient was seen and examined on 10/02/2022   Brief hospital course: LEANE SOLACHE is a 62 y.o. female with medical history significant of recurrent diverticulitis, asthma/COPD, GERD, presented with worsening of LLQ pain.   Patient has a history of recurrent diverticulitis, first and second episode in January and May this year.  About 5 days ago patient started to have similar LLQ pain, episodic, subsided on its own.  Patient did not feel the pain significantly affected her daily life and did not come to seek medical advice.  This morning patient woke up with 10/10 sharp like pain in the lower left lower quadrant associated with 2 loose bowel movement and nausea but no vomiting she also had chills but no fever.  Assessment and Plan: Acute complicated diverticulitis with bowel perforation abscess formation and colon/intestine fistula formation Acute peritonitis secondary to above continue IV pain meds, IV antiemetics Continue ceftriaxone and Flagyl therapy. General Surgery team follow up appreciated. CT abd/ pelvis ordered, given her pain, nausea.  Continue clears for now. Plan is for elective sigmoid resection as an outpatient.   Outpatient GI follow-up for colonoscopy in 6 weeks   Hypokalemia Resolved post supplements.   COPD No exacerbation. She is on room air. Continue bronchodilators PRN.   GERD Stable, continue PPI  Homeless TOC for discharge needs.    Subjective: Patient is seen and examined today morning. She has pain left lower quadrant. Feels nauseous. Able to tolerate clears. Awaiting CT abdomen/ pelvis, drinking contrast.  Physical Exam: Vitals:   10/01/22 2151 10/01/22 2152 10/02/22 0426 10/02/22 0858  BP: (!) 97/56  130/64 126/79  Pulse: (!) 55 (!) 53 60 (!) 54  Resp: 18  20 18   Temp: 98.2 F (36.8 C)  (!) 97.3 F (36.3 C) 98 F (36.7 C)  TempSrc:  Oral  Oral   SpO2: 91% 93% 97% 96%  Weight:      Height:       General - Middle aged Caucasian female, no apparent distress HEENT - PERRLA, EOMI, atraumatic head, non tender sinuses. Lung - Clear, rales, rhonchi, wheezes. Heart - S1, S2 heard, no murmurs, rubs, trace pedal edema. Abdomen - soft, LLQ tender, bowel sounds good. Neuro - Alert, awake and oriented x 3, non focal exam. Skin - Warm and dry. Data Reviewed:     Latest Ref Rng & Units 10/02/2022    5:31 AM 10/01/2022    7:03 AM 09/30/2022    4:18 AM  CBC  WBC 4.0 - 10.5 K/uL 2.3  2.7  4.7   Hemoglobin 12.0 - 15.0 g/dL 52.8  41.3  24.4   Hematocrit 36.0 - 46.0 % 36.8  37.0  35.7   Platelets 150 - 400 K/uL 176  158  166       Latest Ref Rng & Units 10/02/2022    5:31 AM 10/01/2022    7:03 AM 09/30/2022    4:18 AM  BMP  Glucose 70 - 99 mg/dL 010  272  76   BUN 8 - 23 mg/dL <5  <5  6   Creatinine 0.44 - 1.00 mg/dL 5.36  6.44  0.34   Sodium 135 - 145 mmol/L 140  138  135   Potassium 3.5 - 5.1 mmol/L 3.9  4.2  3.4   Chloride 98 - 111 mmol/L 102  102  101   CO2 22 - 32  mmol/L 29  29  25    Calcium 8.9 - 10.3 mg/dL 8.6  8.4  8.0    No results found.   Family Communication: Homeless, understands and agrees with current care plan.  Disposition: Status is: Inpatient Remains inpatient appropriate because: CT abd/ pelvis surgery team follow up. Diet advancement.  Planned Discharge Destination: Barriers to discharge: homeless    Time spent: 38 minutes  Author: Marcelino Duster, MD 10/02/2022 3:54 PM  For on call review [email or web address].

## 2022-10-03 ENCOUNTER — Other Ambulatory Visit: Payer: Self-pay

## 2022-10-03 DIAGNOSIS — K5720 Diverticulitis of large intestine with perforation and abscess without bleeding: Secondary | ICD-10-CM | POA: Diagnosis not present

## 2022-10-03 DIAGNOSIS — K5732 Diverticulitis of large intestine without perforation or abscess without bleeding: Secondary | ICD-10-CM | POA: Diagnosis not present

## 2022-10-03 DIAGNOSIS — Z5900 Homelessness unspecified: Secondary | ICD-10-CM | POA: Diagnosis not present

## 2022-10-03 DIAGNOSIS — D72819 Decreased white blood cell count, unspecified: Secondary | ICD-10-CM

## 2022-10-03 DIAGNOSIS — E876 Hypokalemia: Secondary | ICD-10-CM | POA: Diagnosis not present

## 2022-10-03 DIAGNOSIS — J439 Emphysema, unspecified: Secondary | ICD-10-CM | POA: Diagnosis not present

## 2022-10-03 LAB — GLUCOSE, CAPILLARY: Glucose-Capillary: 103 mg/dL — ABNORMAL HIGH (ref 70–99)

## 2022-10-03 LAB — BASIC METABOLIC PANEL
Anion gap: 7 (ref 5–15)
BUN: <5 mg/dL — ABNORMAL LOW (ref 8–23)
CO2: 29 mmol/L (ref 22–32)
Calcium: 8.9 mg/dL (ref 8.9–10.3)
Chloride: 102 mmol/L (ref 98–111)
Creatinine, Ser: 0.55 mg/dL (ref 0.44–1.00)
GFR, Estimated: >60 mL/min (ref >60)
Glucose, Bld: 117 mg/dL — ABNORMAL HIGH (ref 70–99)
Potassium: 3.7 mmol/L (ref 3.5–5.1)
Sodium: 138 mmol/L (ref 135–145)

## 2022-10-03 LAB — CULTURE, BLOOD (ROUTINE X 2)
Culture: NO GROWTH
Culture: NO GROWTH
Special Requests: ADEQUATE
Specimen Description: ADEQUATE

## 2022-10-03 LAB — CBC
HCT: 37.6 % (ref 36.0–46.0)
Hemoglobin: 12.1 g/dL (ref 12.0–15.0)
MCH: 27.1 pg (ref 26.0–34.0)
MCHC: 32.2 g/dL (ref 30.0–36.0)
MCV: 84.3 fL (ref 80.0–100.0)
Platelets: 213 10*3/uL (ref 150–400)
RBC: 4.46 MIL/uL (ref 3.87–5.11)
RDW: 13.1 % (ref 11.5–15.5)
WBC: 2.4 10*3/uL — ABNORMAL LOW (ref 4.0–10.5)
nRBC: 0 % (ref 0.0–0.2)

## 2022-10-03 MED ORDER — SODIUM CHLORIDE 0.9% FLUSH
10.0000 mL | Freq: Two times a day (BID) | INTRAVENOUS | Status: DC
  Administered 2022-10-03: 20 mL
  Administered 2022-10-03 – 2022-10-05 (×4): 10 mL
  Administered 2022-10-06: 20 mL
  Administered 2022-10-06 – 2022-10-07 (×2): 10 mL
  Administered 2022-10-07: 20 mL
  Administered 2022-10-08 (×2): 10 mL
  Administered 2022-10-09: 30 mL
  Administered 2022-10-09 – 2022-10-10 (×2): 10 mL
  Administered 2022-10-10: 20 mL
  Administered 2022-10-11: 10 mL

## 2022-10-03 MED ORDER — TRAVASOL 10 % IV SOLN
INTRAVENOUS | Status: AC
  Filled 2022-10-03: qty 480

## 2022-10-03 MED ORDER — SODIUM CHLORIDE 0.9% FLUSH
10.0000 mL | INTRAVENOUS | Status: DC | PRN
  Administered 2022-10-03 – 2022-10-08 (×2): 10 mL

## 2022-10-03 MED ORDER — CHLORHEXIDINE GLUCONATE CLOTH 2 % EX PADS
6.0000 | MEDICATED_PAD | Freq: Every day | CUTANEOUS | Status: DC
  Administered 2022-10-03 – 2022-10-11 (×8): 6 via TOPICAL

## 2022-10-03 MED ORDER — POLYETHYLENE GLYCOL 3350 17 GM/SCOOP PO POWD
1.0000 | Freq: Once | ORAL | Status: AC
  Administered 2022-10-03: 255 g via ORAL
  Filled 2022-10-03 (×2): qty 255

## 2022-10-03 MED ORDER — INSULIN ASPART 100 UNIT/ML IJ SOLN
0.0000 [IU] | Freq: Three times a day (TID) | INTRAMUSCULAR | Status: DC
  Administered 2022-10-04 – 2022-10-05 (×2): 2 [IU] via SUBCUTANEOUS
  Administered 2022-10-05 – 2022-10-07 (×6): 1 [IU] via SUBCUTANEOUS
  Filled 2022-10-03 (×8): qty 1

## 2022-10-03 MED ORDER — METRONIDAZOLE 500 MG PO TABS
500.0000 mg | ORAL_TABLET | Freq: Three times a day (TID) | ORAL | Status: AC
  Administered 2022-10-03 (×3): 500 mg via ORAL
  Filled 2022-10-03 (×3): qty 1

## 2022-10-03 MED ORDER — METRONIDAZOLE 500 MG/100ML IV SOLN
500.0000 mg | Freq: Two times a day (BID) | INTRAVENOUS | Status: DC
  Administered 2022-10-04 – 2022-10-06 (×5): 500 mg via INTRAVENOUS
  Filled 2022-10-03 (×5): qty 100

## 2022-10-03 MED ORDER — OXYCODONE HCL 5 MG PO TABS
5.0000 mg | ORAL_TABLET | ORAL | Status: DC | PRN
  Administered 2022-10-03 – 2022-10-11 (×24): 5 mg via ORAL
  Filled 2022-10-03 (×24): qty 1

## 2022-10-03 MED ORDER — NEOMYCIN SULFATE 500 MG PO TABS
1000.0000 mg | ORAL_TABLET | Freq: Three times a day (TID) | ORAL | Status: AC
  Administered 2022-10-03 (×3): 1000 mg via ORAL
  Filled 2022-10-03 (×3): qty 2

## 2022-10-03 NOTE — Progress Notes (Signed)
At bedside for PICC placement.  Pt states does not want to give consent due to she was unaware of it and no doctor has mentioned it to her.  Explained need for TNA pre-op and postop. Spent approximately 10-15 minutes explained risks and benefits and need.  Pt continues to decline PICC placement at this time.

## 2022-10-03 NOTE — Consult Note (Signed)
PHARMACY - TOTAL PARENTERAL NUTRITION CONSULT NOTE   Indication:  Poor PO intake with planned surgical intervention   Patient Measurements: Height: 5' 6.5" (168.9 cm) Weight: 62.1 kg (136 lb 14.5 oz) IBW/kg (Calculated) : 60.45 TPN AdjBW (KG): 61.2 Body mass index is 21.77 kg/m.   Assessment: 62 yo female with PMH including recurrent diverticulitis, asthma/COPD, GERD.  Patient presented due to abdominal pain and was diagnosed with diverticulitis.  Currently receiving IV antibiotics with ceftriaxone and flagyl.  Plan now if for patient to go to OR on 9/20 for sigmoid colectomy.  Glucose / Insulin: No PMH of diabetes Electrolytes: WNL Renal: Scr < 1 Hepatic: LFTs mildly elevated in admission will order labs for tomorrow Intake / Output; MIVF: N/A GI Imaging: 9/18 CT abd/pel: Diverticulitis with abscess GI Surgeries / Procedures:  Sigmoid colectomy planned for 9/20  Central access: Order for PICC line placed TPN start date: 10/03/22  Nutritional Goals: Goal TPN rate is 75 mL/hr (provides 90 g of protein and 1695.6 kcals per day)  RD Assessment: Estimated Needs Total Energy Estimated Needs: 1600-1800kcal/day Total Protein Estimated Needs: 80-90g/day Total Fluid Estimated Needs: 1.7-2.0L/day  Current Nutrition:  Clear liquids  Plan:  Start TPN at 40 mL/hr at 1800 Electrolytes in TPN: Na 54mEq/L, K 36mEq/L, Ca 8mEq/L, Mg 59mEq/L, and Phos 74mmol/L. Cl:Ac 1:1 Add standard MVI and trace elements to TPN Initiate Sensitive q8h SSI and adjust as needed  Monitor TPN labs on Mon/Thurs, and daily until labs are stable  Barrie Folk, PharmD 10/03/2022,11:00 AM

## 2022-10-03 NOTE — Progress Notes (Signed)
Peripherally Inserted Central Catheter Placement  The IV Nurse has discussed with the patient and/or persons authorized to consent for the patient, the purpose of this procedure and the potential benefits and risks involved with this procedure.  The benefits include less needle sticks, lab draws from the catheter, and the patient may be discharged home with the catheter. Risks include, but not limited to, infection, bleeding, blood clot (thrombus formation), and puncture of an artery; nerve damage and irregular heartbeat and possibility to perform a PICC exchange if needed/ordered by physician.  Alternatives to this procedure were also discussed.  Bard Power PICC patient education guide, fact sheet on infection prevention and patient information card has been provided to patient /or left at bedside.    PICC Placement Documentation  PICC Double Lumen 10/03/22 Right Brachial 34 cm 0 cm (Active)  Indication for Insertion or Continuance of Line Administration of hyperosmolar/irritating solutions (i.e. TPN, Vancomycin, etc.) 10/03/22 1247  Exposed Catheter (cm) 0 cm 10/03/22 1247  Site Assessment Clean, Dry, Intact 10/03/22 1247  Lumen #1 Status Flushed;Saline locked;Blood return noted 10/03/22 1247  Lumen #2 Status Flushed;Saline locked;Blood return noted 10/03/22 1247  Dressing Type Transparent;Securing device 10/03/22 1247  Dressing Status Antimicrobial disc in place;Clean, Dry, Intact 10/03/22 1247  Line Care Connections checked and tightened 10/03/22 1247  Line Adjustment (NICU/IV Team Only) No 10/03/22 1247  Dressing Intervention New dressing 10/03/22 1247  Dressing Change Due 10/10/22 10/03/22 1247       Elliot Dally 10/03/2022, 12:48 PM

## 2022-10-03 NOTE — Progress Notes (Signed)
[facility] SURGICAL PROGRESS NOTE (cpt [phone])  Hospital Day(s): 5.   Interval History: Patient seen and examined, no acute events or new complaints overnight. Patient reports she continues to have very localized LLQ pain. Still with nausea; no emesis. No fever, chills. She is mildly leukopenic this AM to 2.4K. Hgb to 12.1. Renal function normal with sCr - 0.55; UO - 1000. No electrolyte derangements. She is on CLD; tolerating. She is having bowel function  Review of Systems:  Constitutional: denies fever, chills  HEENT: denies cough or congestion  Respiratory: denies any shortness of breath  Cardiovascular: denies chest pain or palpitations  Gastrointestinal: + abdominal pain (LLQ); + nausea, denied emesis  Genitourinary: denies burning with urination or urinary frequency Musculoskeletal: denies pain, decreased motor or sensation  Vital signs in last 24 hours: [min-max] current  Temp:  [97.5 F (36.4 C)-98.1 F (36.7 C)] 97.8 F (36.6 C) (09/19 0816) Pulse Rate:  [54-69] 54 (09/19 0816) Resp:  [16-18] 16 (09/19 0816) BP: (100-126)/(56-79) 124/68 (09/19 0816) SpO2:  [95 %-98 %] 95 % (09/19 0816) Weight:  [62.1 kg] 62.1 kg (09/19 0437)     Height: 5' 6.5" (168.9 cm) Weight: 62.1 kg BMI (Calculated): 21.77   Intake/Output last 2 shifts:  09/18 0701 - 09/19 0700 In: 1606.5 [P.O.:360; IV Piggyback:1246.5] Out: 1000 [Urine:1000]   Physical Exam:  Constitutional: alert, cooperative and no distress  HENT: normocephalic without obvious abnormality  Eyes: PERRL, EOM's grossly intact and symmetric  Respiratory: breathing non-labored at rest  Cardiovascular: regular rate and sinus rhythm  Gastrointestinal: soft, she has LLQ point tenderness which is similar to previous examination, she is non-distended, no rebound/guarding. She is certainly not peritonitic Musculoskeletal: no edema or wounds, motor and sensation grossly intact, NT    Labs:     Latest Ref Rng & Units  10/03/2022    4:25 AM 10/02/2022    5:31 AM 10/01/2022    7:03 AM  CBC  WBC 4.0 - 10.5 K/uL 2.4  2.3  2.7   Hemoglobin 12.0 - 15.0 g/dL 60.4  54.0  98.1   Hematocrit 36.0 - 46.0 % 37.6  36.8  37.0   Platelets 150 - 400 K/uL 213  176  158       Latest Ref Rng & Units 10/03/2022    4:25 AM 10/02/2022    5:31 AM 10/01/2022    7:03 AM  CMP  Glucose 70 - 99 mg/dL 191  478  295   BUN 8 - 23 mg/dL <5  <5  <5   Creatinine 0.44 - 1.00 mg/dL 6.21  3.08  6.57   Sodium 135 - 145 mmol/L 138  140  138   Potassium 3.5 - 5.1 mmol/L 3.7  3.9  4.2   Chloride 98 - 111 mmol/L 102  102  102   CO2 22 - 32 mmol/L 29  29  29    Calcium 8.9 - 10.3 mg/dL 8.9  8.6  8.4      Imaging studies: No new pertinent imaging studies   Assessment/Plan: (ICD-10's: K26.92) 62 y.o. 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   - All risks, benefits, and alternatives to above procedure(s) were discussed with the patient, all of her questions were answered to her expressed satisfaction, patient expresses  she wishes to proceed, and informed consent was obtained.   - Okay to continue CLD; NPO at midnight   - Will do bowel prep today; Miralax + PO Flagyl/Neomycin x3 scheduled   - Continue IV Abx (Rocephin/Flagyl)  - Monitor abdominal examination  - Pain control prn; antiemetics prn   - Mobilize - Further management per primary service; we will follow  All of the above findings and recommendations were discussed with the patient, and the medical team, and all of patient's questions were answered to her expressed satisfaction.  -- Lynden Oxford, PA-C [facility] 10/03/2022, 8:47 AM M-F: 7am - 4pm

## 2022-10-03 NOTE — Progress Notes (Signed)
Nutrition Follow-up  DOCUMENTATION CODES:   Not applicable  INTERVENTION:   TPN per pharmacy   Continue thiamine, folic acid and MVI daily   Pt at high refeed risk; recommend monitor potassium, magnesium and phosphorus labs daily until stable  Daily weights   Boost Breeze po TID, each supplement provides 250 kcal and 9 grams of protein  NUTRITION DIAGNOSIS:   Inadequate oral intake related to acute illness as evidenced by other (comment) (pt on clear liquid diet). -ongoing   GOAL:   Patient will meet greater than or equal to 90% of their needs -not met   MONITOR:   PO intake, Supplement acceptance, Diet advancement, Labs, Weight trends, I & O's, Skin  ASSESSMENT:   61 y/o female with h/o asthma, GERD, substance abuse, COPD, CAD and HTN who is admitted with acute diverticulitis with bowel perforation & abscess complicated by colon/intestine fistula.  Met with pt in room today; pt complaining of abdominal pain. Pt reports ongoing nausea but no emesis. Pt continues to have bowel function. Pt reports eating chicken broth for breakfast this morning. Pt has not yet eaten lunch as she reports she was sleeping. Pt has now been without adequate nutrition for 6 days. Plan is to proceed with surgical intervention tomorrow. Will initiate TPN today; PICC line already in place. RD discussed with pt the importance of adequate nutrition needed to preserve lean muscle and to support post op healing. RD discussed TPN with patient, the indications for initiation and and what to expect. Pt is agreeable to drinking supplements with diet advancement. Pt has been drinking some Boost Breeze. Pt is at high refeed risk.    Medications reviewed and include: lovenox, folic acid, MVI, nicotine, miralax, protonix, thiamine, ceftriaxone, metronidazole   Labs reviewed: K 3.7 wnl P 3.8 wnl, Mg 2.1 wnl- 9/18 Wbc- 2.4(L)  Diet Order:   Diet Order             Diet NPO time specified Except for: Sips with  Meds  Diet effective midnight           Diet clear liquid Room service appropriate? Yes; Fluid consistency: Thin  Diet effective now                  EDUCATION NEEDS:   No education needs have been identified at this time  Skin:  Skin Assessment: Reviewed RN Assessment  Last BM:  9/19  Height:   Ht Readings from Last 1 Encounters:  09/28/22 5' 6.5" (1.689 m)    Weight:   Wt Readings from Last 1 Encounters:  10/03/22 62.1 kg    Ideal Body Weight:  60.2 kg  BMI:  Body mass index is 21.77 kg/m.  Estimated Nutritional Needs:   Kcal:  1600-1800kcal/day  Protein:  80-90g/day  Fluid:  1.7-2.0L/day  Betsey Holiday MS, RD, LDN Please refer to [facility] for RD and/or RD on-call/weekend/after hours pager

## 2022-10-03 NOTE — Progress Notes (Signed)
Progress Note   Patient: Stacy Gilmore FAO:[id number] DOB: Jan 30, 1960 DOA: 09/28/2022     5 DOS: the patient was seen and examined on 10/03/2022   Brief hospital course: Stacy Gilmore is a 62 y.o. female with medical history significant of recurrent diverticulitis, asthma/COPD, GERD, presented with worsening of LLQ pain. Patient has a history of recurrent diverticulitis, first and second episode in January and May this year.  About 5 days ago patient started to have similar LLQ pain, episodic, subsided on its own.  Patient did not feel the pain significantly affected her daily life and did not come to seek medical advice.  This morning patient woke up with 10/10 sharp like pain in the lower left lower quadrant associated with 2 loose bowel movement and nausea but no vomiting she also had chills but no fever. She is admitted for further management, seen by surgery team advised conservative management. Pain control, diet advancement gradually. Her pain, nausea did not improve. Repeat CT abdomen though reassuring, she does not have clinical improvement. Surgery team recommended open sigmoid colectomy and possible small bowel resection planned for 10/04/22.  Assessment and Plan: Acute complicated diverticulitis with bowel perforation abscess formation and colon/intestine fistula formation Acute peritonitis secondary to above continue IV pain meds, IV antiemetics Continue ceftriaxone and Flagyl therapy. Repeat CT abdomen reviewed shows improvement, she has LLQ pain, nausea. General Surgery team recommended open sigmoid colectomy and possible small bowel resection planned for tomorrow. NPO past midnight.   Hypokalemia Resolved post supplements.  Leukopenia - WBC 2.4 stable, monitor daily CBC.   COPD No exacerbation. She is on room air. Continue bronchodilators PRN.   GERD Stable, continue PPI  Homeless TOC for discharge needs.    Subjective: Patient is seen and examined today  morning. She has left lower quadrant pain, tenderness. Feels nauseous. Able to tolerate clears. She understands surgical plan for tomorrow.  Physical Exam: Vitals:   10/02/22 1714 10/02/22 2044 10/03/22 0437 10/03/22 0816  BP: (!) 100/56 109/70 111/74 124/68  Pulse: 69 60 62 (!) 54  Resp: 18 16 18 16   Temp: (!) 97.5 F (36.4 C) 98 F (36.7 C) 98.1 F (36.7 C) 97.8 F (36.6 C)  TempSrc: Oral  Oral   SpO2: 95% 96% 98% 95%  Weight:   62.1 kg   Height:       General - Middle aged Caucasian female, no apparent distress HEENT - PERRLA, EOMI, atraumatic head, non tender sinuses. Lung - Clear, rales, rhonchi, wheezes. Heart - S1, S2 heard, no murmurs, rubs, trace pedal edema. Abdomen - soft, LLQ tender, bowel sounds good. Neuro - Alert, awake and oriented x 3, non focal exam. Skin - Warm and dry. Data Reviewed:     Latest Ref Rng & Units 10/03/2022    4:25 AM 10/02/2022    5:31 AM 10/01/2022    7:03 AM  CBC  WBC 4.0 - 10.5 K/uL 2.4  2.3  2.7   Hemoglobin 12.0 - 15.0 g/dL 69.6  29.5  28.4   Hematocrit 36.0 - 46.0 % 37.6  36.8  37.0   Platelets 150 - 400 K/uL 213  176  158       Latest Ref Rng & Units 10/03/2022    4:25 AM 10/02/2022    5:31 AM 10/01/2022    7:03 AM  BMP  Glucose 70 - 99 mg/dL 132  440  102   BUN 8 - 23 mg/dL <5  <5  <5   Creatinine 0.44 -  1.00 mg/dL 4.13  2.44  0.10   Sodium 135 - 145 mmol/L 138  140  138   Potassium 3.5 - 5.1 mmol/L 3.7  3.9  4.2   Chloride 98 - 111 mmol/L 102  102  102   CO2 22 - 32 mmol/L 29  29  29    Calcium 8.9 - 10.3 mg/dL 8.9  8.6  8.4    [place] EKG SITE RITE  Result Date: 10/03/2022 If Site Rite image not attached, placement could not be confirmed due to current cardiac rhythm.  CT ABDOMEN PELVIS W CONTRAST  Result Date: 10/02/2022 CLINICAL DATA:  Diverticulitis. Worsening abdominal pain and nausea. Follow-up coloenteric fistula. EXAM: CT ABDOMEN AND PELVIS WITH CONTRAST TECHNIQUE: Multidetector CT imaging of the abdomen and pelvis was  performed using the standard protocol following bolus administration of intravenous contrast. RADIATION DOSE REDUCTION: This exam was performed according to the departmental dose-optimization program which includes automated exposure control, adjustment of the mA and/or kV according to patient size and/or use of iterative reconstruction technique. CONTRAST:  OMNIPAQUE IOHEXOL 300 MG/ML  SOLN COMPARISON:  09/28/2022 FINDINGS: Lower Chest: No acute findings. Hepatobiliary: No suspicious hepatic masses identified. Prior cholecystectomy. No evidence of biliary obstruction. Pancreas:  No mass or inflammatory changes. Spleen: Stable mild splenomegaly measuring approximately 14 cm in length. Adrenals/Urinary Tract: No suspicious masses identified. No evidence of ureteral calculi or hydronephrosis. Stomach/Bowel: Mild diverticulitis is again seen involving the proximal sigmoid colon. A fistula is again seen along the anterior wall of the colon which terminates in a small fluid and gas collection, consistent with abscess. This currently measures 1.7 x 1.0 cm, decreased in size from 2.0 x 2.0 cm on previous study. This collection abuts several left lower quadrant small bowel loops, and fistulous communication with the small bowel loops cannot be excluded. Vascular/Lymphatic: No pathologically enlarged lymph nodes. No acute vascular findings. Reproductive:  No mass or other significant abnormality. Other:  None. Musculoskeletal:  No suspicious bone lesions identified. IMPRESSION: Mild sigmoid diverticulitis, with small diverticular abscess which has decreased in size since previous study. This abscess abuts several left lower quadrant small bowel loops, and coloenteric fistula cannot be excluded. Stable mild splenomegaly. Electronically Signed   By: Danae Orleans M.D.   On: 10/02/2022 16:49     Family Communication: Homeless, understands and agrees with current care plan.  Disposition: Status is: Inpatient Remains  inpatient appropriate because: Surgical intervention tomorrow.  Planned Discharge Destination: Barriers to discharge: homeless    Time spent: 41 minutes  Author: Marcelino Duster, MD 10/03/2022 4:12 PM  For on call review [email or web address].

## 2022-10-04 ENCOUNTER — Encounter
Admission: EM | Disposition: A | Discharge status: Home / Self Care | Payer: Self-pay | Source: Home / Self Care | Attending: Internal Medicine

## 2022-10-04 ENCOUNTER — Inpatient Hospital Stay: Payer: Medicaid Other | Admitting: Anesthesiology

## 2022-10-04 ENCOUNTER — Other Ambulatory Visit: Payer: Self-pay

## 2022-10-04 ENCOUNTER — Encounter: Payer: Self-pay | Admitting: Internal Medicine

## 2022-10-04 DIAGNOSIS — Z5900 Homelessness unspecified: Secondary | ICD-10-CM | POA: Diagnosis not present

## 2022-10-04 DIAGNOSIS — E876 Hypokalemia: Secondary | ICD-10-CM | POA: Diagnosis not present

## 2022-10-04 DIAGNOSIS — Z9049 Acquired absence of other specified parts of digestive tract: Secondary | ICD-10-CM

## 2022-10-04 DIAGNOSIS — K5720 Diverticulitis of large intestine with perforation and abscess without bleeding: Secondary | ICD-10-CM | POA: Diagnosis not present

## 2022-10-04 DIAGNOSIS — J439 Emphysema, unspecified: Secondary | ICD-10-CM | POA: Diagnosis not present

## 2022-10-04 HISTORY — PX: COLON RESECTION SIGMOID: SHX6737

## 2022-10-04 HISTORY — PX: LAPAROTOMY: SHX154

## 2022-10-04 HISTORY — PX: APPENDECTOMY: SHX54

## 2022-10-04 LAB — CBC
HCT: 36.9 % (ref 36.0–46.0)
Hemoglobin: 11.9 g/dL — ABNORMAL LOW (ref 12.0–15.0)
MCH: 27.5 pg (ref 26.0–34.0)
MCHC: 32.2 g/dL (ref 30.0–36.0)
MCV: 85.2 fL (ref 80.0–100.0)
Platelets: 228 10*3/uL (ref 150–400)
RBC: 4.33 MIL/uL (ref 3.87–5.11)
RDW: 13.1 % (ref 11.5–15.5)
WBC: 3 10*3/uL — ABNORMAL LOW (ref 4.0–10.5)
nRBC: 0 % (ref 0.0–0.2)

## 2022-10-04 LAB — GLUCOSE, CAPILLARY
Glucose-Capillary: 112 mg/dL — ABNORMAL HIGH (ref 70–99)
Glucose-Capillary: 116 mg/dL — ABNORMAL HIGH (ref 70–99)
Glucose-Capillary: 168 mg/dL — ABNORMAL HIGH (ref 70–99)
Glucose-Capillary: 168 mg/dL — ABNORMAL HIGH (ref 70–99)

## 2022-10-04 LAB — COMPREHENSIVE METABOLIC PANEL
ALT: 46 U/L — ABNORMAL HIGH (ref 0–44)
AST: 46 U/L — ABNORMAL HIGH (ref 15–41)
Albumin: 3.5 g/dL (ref 3.5–5.0)
Alkaline Phosphatase: 57 U/L (ref 38–126)
Anion gap: 9 (ref 5–15)
BUN: 6 mg/dL — ABNORMAL LOW (ref 8–23)
CO2: 29 mmol/L (ref 22–32)
Calcium: 8.5 mg/dL — ABNORMAL LOW (ref 8.9–10.3)
Chloride: 101 mmol/L (ref 98–111)
Creatinine, Ser: 0.52 mg/dL (ref 0.44–1.00)
GFR, Estimated: >60 mL/min (ref >60)
Glucose, Bld: 117 mg/dL — ABNORMAL HIGH (ref 70–99)
Potassium: 3.7 mmol/L (ref 3.5–5.1)
Sodium: 139 mmol/L (ref 135–145)
Total Bilirubin: 0.4 mg/dL (ref 0.3–1.2)
Total Protein: 6.7 g/dL (ref 6.5–8.1)

## 2022-10-04 LAB — TYPE AND SCREEN
ABO/RH(D): O POS
Antibody Screen: NEGATIVE

## 2022-10-04 LAB — MAGNESIUM: Magnesium: 1.9 mg/dL (ref 1.7–2.4)

## 2022-10-04 LAB — ABO/RH: ABO/RH(D): O POS

## 2022-10-04 LAB — TRIGLYCERIDES: Triglycerides: 133 mg/dL (ref <150)

## 2022-10-04 LAB — PHOSPHORUS: Phosphorus: 4.9 mg/dL — ABNORMAL HIGH (ref 2.5–4.6)

## 2022-10-04 SURGERY — COLECTOMY, SIGMOID, OPEN
Anesthesia: General | Site: Abdomen

## 2022-10-04 MED ORDER — SUGAMMADEX SODIUM 200 MG/2ML IV SOLN
INTRAVENOUS | Status: DC | PRN
  Administered 2022-10-04: 140 mg via INTRAVENOUS

## 2022-10-04 MED ORDER — GLYCOPYRROLATE 0.2 MG/ML IJ SOLN
INTRAMUSCULAR | Status: DC | PRN
  Administered 2022-10-04: .2 mg via INTRAVENOUS

## 2022-10-04 MED ORDER — ACETAMINOPHEN 10 MG/ML IV SOLN
INTRAVENOUS | Status: DC | PRN
  Administered 2022-10-04: 1000 mg via INTRAVENOUS

## 2022-10-04 MED ORDER — ROCURONIUM BROMIDE 10 MG/ML (PF) SYRINGE
PREFILLED_SYRINGE | INTRAVENOUS | Status: AC
  Filled 2022-10-04: qty 10

## 2022-10-04 MED ORDER — ONDANSETRON HCL 4 MG/2ML IJ SOLN
INTRAMUSCULAR | Status: AC
  Filled 2022-10-04: qty 2

## 2022-10-04 MED ORDER — PHENYLEPHRINE HCL (PRESSORS) 10 MG/ML IV SOLN
INTRAVENOUS | Status: AC
  Filled 2022-10-04: qty 1

## 2022-10-04 MED ORDER — PROPOFOL 1000 MG/100ML IV EMUL
INTRAVENOUS | Status: AC
  Filled 2022-10-04: qty 100

## 2022-10-04 MED ORDER — BUPIVACAINE-EPINEPHRINE (PF) 0.25% -1:200000 IJ SOLN
INTRAMUSCULAR | Status: DC | PRN
  Administered 2022-10-04: 30 mL via PERINEURAL

## 2022-10-04 MED ORDER — HYDROMORPHONE HCL 1 MG/ML IJ SOLN
0.5000 mg | INTRAMUSCULAR | Status: DC | PRN
  Administered 2022-10-04 (×3): 0.5 mg via INTRAVENOUS

## 2022-10-04 MED ORDER — PROPOFOL 10 MG/ML IV BOLUS
INTRAVENOUS | Status: DC | PRN
  Administered 2022-10-04: 150 mg via INTRAVENOUS
  Administered 2022-10-04: 140 ug/kg/min via INTRAVENOUS

## 2022-10-04 MED ORDER — TRAVASOL 10 % IV SOLN
INTRAVENOUS | Status: AC
  Filled 2022-10-04: qty 873.6

## 2022-10-04 MED ORDER — FENTANYL CITRATE (PF) 100 MCG/2ML IJ SOLN
INTRAMUSCULAR | Status: AC
  Filled 2022-10-04: qty 2

## 2022-10-04 MED ORDER — KETAMINE HCL 50 MG/5ML IJ SOSY
PREFILLED_SYRINGE | INTRAMUSCULAR | Status: AC
  Filled 2022-10-04: qty 5

## 2022-10-04 MED ORDER — ROCURONIUM BROMIDE 100 MG/10ML IV SOLN
INTRAVENOUS | Status: DC | PRN
  Administered 2022-10-04: 100 mg via INTRAVENOUS
  Administered 2022-10-04: 10 mg via INTRAVENOUS

## 2022-10-04 MED ORDER — ORAL CARE MOUTH RINSE: 15.0000 mL | Freq: Once | OROMUCOSAL | Status: AC

## 2022-10-04 MED ORDER — HYDROMORPHONE HCL 1 MG/ML IJ SOLN
INTRAMUSCULAR | Status: AC
  Filled 2022-10-04: qty 1

## 2022-10-04 MED ORDER — ONDANSETRON HCL 4 MG/2ML IJ SOLN
INTRAMUSCULAR | Status: DC | PRN
  Administered 2022-10-04: 4 mg via INTRAVENOUS

## 2022-10-04 MED ORDER — FENTANYL CITRATE (PF) 100 MCG/2ML IJ SOLN
INTRAMUSCULAR | Status: DC | PRN
  Administered 2022-10-04 (×2): 50 ug via INTRAVENOUS

## 2022-10-04 MED ORDER — CEFAZOLIN SODIUM-DEXTROSE 2-3 GM-%(50ML) IV SOLR
INTRAVENOUS | Status: DC | PRN
  Administered 2022-10-04: 2 g via INTRAVENOUS

## 2022-10-04 MED ORDER — FENTANYL CITRATE (PF) 100 MCG/2ML IJ SOLN
25.0000 ug | INTRAMUSCULAR | Status: DC | PRN
  Administered 2022-10-04: 25 ug via INTRAVENOUS
  Administered 2022-10-04 (×2): 50 ug via INTRAVENOUS
  Administered 2022-10-04: 25 ug via INTRAVENOUS

## 2022-10-04 MED ORDER — 0.9 % SODIUM CHLORIDE (POUR BTL) OPTIME
TOPICAL | Status: DC | PRN
  Administered 2022-10-04: 500 mL

## 2022-10-04 MED ORDER — MIDAZOLAM HCL 2 MG/2ML IJ SOLN
INTRAMUSCULAR | Status: DC | PRN
  Administered 2022-10-04: 2 mg via INTRAVENOUS

## 2022-10-04 MED ORDER — ACETAMINOPHEN 10 MG/ML IV SOLN
INTRAVENOUS | Status: AC
  Filled 2022-10-04: qty 100

## 2022-10-04 MED ORDER — SUCCINYLCHOLINE CHLORIDE 200 MG/10ML IV SOSY
PREFILLED_SYRINGE | INTRAVENOUS | Status: AC
  Filled 2022-10-04: qty 10

## 2022-10-04 MED ORDER — LIDOCAINE HCL (PF) 2 % IJ SOLN
INTRAMUSCULAR | Status: AC
  Filled 2022-10-04: qty 5

## 2022-10-04 MED ORDER — DEXAMETHASONE SODIUM PHOSPHATE 10 MG/ML IJ SOLN
INTRAMUSCULAR | Status: DC | PRN
  Administered 2022-10-04: 10 mg via INTRAVENOUS

## 2022-10-04 MED ORDER — OXYCODONE HCL 5 MG PO TABS: 5.0000 mg | ORAL_TABLET | Freq: Once | ORAL | Status: DC | PRN

## 2022-10-04 MED ORDER — OXYCODONE HCL 5 MG/5ML PO SOLN: 5.0000 mg | Freq: Once | ORAL | Status: DC | PRN

## 2022-10-04 MED ORDER — ACETAMINOPHEN 10 MG/ML IV SOLN: 1000.0000 mg | Freq: Once | INTRAVENOUS | Status: DC | PRN

## 2022-10-04 MED ORDER — ONDANSETRON HCL 4 MG/2ML IJ SOLN: 4.0000 mg | Freq: Once | INTRAMUSCULAR | Status: DC | PRN

## 2022-10-04 MED ORDER — LIDOCAINE HCL (CARDIAC) PF 100 MG/5ML IV SOSY
PREFILLED_SYRINGE | INTRAVENOUS | Status: DC | PRN
  Administered 2022-10-04: 100 mg via INTRAVENOUS

## 2022-10-04 MED ORDER — MIDAZOLAM HCL 2 MG/2ML IJ SOLN
INTRAMUSCULAR | Status: AC
  Filled 2022-10-04: qty 2

## 2022-10-04 MED ORDER — LACTATED RINGERS IV SOLN: INTRAVENOUS | Status: DC

## 2022-10-04 MED ORDER — CHLORHEXIDINE GLUCONATE 0.12 % MT SOLN
OROMUCOSAL | Status: AC
  Filled 2022-10-04: qty 15

## 2022-10-04 MED ORDER — BUPIVACAINE-EPINEPHRINE (PF) 0.25% -1:200000 IJ SOLN
INTRAMUSCULAR | Status: AC
  Filled 2022-10-04: qty 30

## 2022-10-04 MED ORDER — CHLORHEXIDINE GLUCONATE 0.12 % MT SOLN
15.0000 mL | Freq: Once | OROMUCOSAL | Status: AC
  Administered 2022-10-04: 15 mL via OROMUCOSAL

## 2022-10-04 MED ORDER — BUPIVACAINE LIPOSOME 1.3 % IJ SUSP
INTRAMUSCULAR | Status: AC
  Filled 2022-10-04: qty 20

## 2022-10-04 MED ORDER — PROPOFOL 10 MG/ML IV BOLUS
INTRAVENOUS | Status: AC
  Filled 2022-10-04: qty 20

## 2022-10-04 MED ORDER — BUPIVACAINE LIPOSOME 1.3 % IJ SUSP
INTRAMUSCULAR | Status: DC | PRN
  Administered 2022-10-04: 20 mL

## 2022-10-04 MED ORDER — CEFAZOLIN SODIUM 1 G IJ SOLR
INTRAMUSCULAR | Status: AC
  Filled 2022-10-04: qty 20

## 2022-10-04 MED ORDER — KETAMINE HCL 50 MG/5ML IJ SOSY
PREFILLED_SYRINGE | INTRAMUSCULAR | Status: DC | PRN
Start: 2022-10-04 — End: 2022-10-04
  Administered 2022-10-04 (×3): 10 mg via INTRAVENOUS
  Administered 2022-10-04: 20 mg via INTRAVENOUS

## 2022-10-04 SURGICAL SUPPLY — 73 items
ADH SKN CLS APL DERMABOND .7 (GAUZE/BANDAGES/DRESSINGS)
APL PRP STRL LF DISP 70% ISPRP (MISCELLANEOUS) ×2
APPLIER CLIP 11 MED OPEN (CLIP) IMPLANT
APPLIER CLIP 13 LRG OPEN (CLIP) IMPLANT
APR CLP LRG 13 20 CLIP (CLIP)
APR CLP MED 11 20 MLT OPN (CLIP)
BLADE CLIPPER SURG (BLADE) ×2 IMPLANT
BLADE SURG 10 STRL SS (BLADE) IMPLANT
CATH ROBINSON RED A/P 14FR (CATHETERS) IMPLANT
CHLORAPREP W/TINT 26 (MISCELLANEOUS) ×2 IMPLANT
CLIP APPLIE 11 MED OPEN (CLIP) IMPLANT
CLIP APPLIE 13 LRG OPEN (CLIP) IMPLANT
DERMABOND ADVANCED .7 DNX12 (GAUZE/BANDAGES/DRESSINGS) IMPLANT
DRAPE C-SECTION (MISCELLANEOUS) ×2 IMPLANT
DRAPE LEGGINS SURG 28X43 STRL (DRAPES) ×2 IMPLANT
DRAPE TABLE BACK 80X90 (DRAPES) ×2 IMPLANT
DRAPE UNDER BUTTOCK W/FLU (DRAPES) ×2 IMPLANT
DRSG OPSITE POSTOP 4X10 (GAUZE/BANDAGES/DRESSINGS) IMPLANT
DRSG OPSITE POSTOP 4X12 (GAUZE/BANDAGES/DRESSINGS) IMPLANT
DRSG OPSITE POSTOP 4X8 (GAUZE/BANDAGES/DRESSINGS) IMPLANT
ELECT BLADE 6.5 EXT (BLADE) ×2 IMPLANT
ELECT CAUTERY BLADE 6.4 (BLADE) ×2 IMPLANT
ELECT CAUTERY BLADE TIP 2.5 (TIP) ×2 IMPLANT
ELECT EZSTD 165MM 6.5IN (MISCELLANEOUS) ×2 IMPLANT
ELECT REM PT RETURN 9FT ADLT (ELECTROSURGICAL) ×2 IMPLANT
ELECTRODE CAUTERY BLDE TIP 2.5 (TIP) ×2 IMPLANT
ELECTRODE EZSTD 165MM 6.5IN (MISCELLANEOUS) ×2 IMPLANT
ELECTRODE REM PT RTRN 9FT ADLT (ELECTROSURGICAL) ×2 IMPLANT
GAUZE 4X4 16PLY ~~LOC~~+RFID DBL (SPONGE) ×2 IMPLANT
GAUZE SPONGE 4X4 12PLY STRL (GAUZE/BANDAGES/DRESSINGS) ×2 IMPLANT
GLOVE ORTHO TXT STRL SZ7.5 (GLOVE) ×8 IMPLANT
GOWN STRL REUS W/ TWL LRG LVL3 (GOWN DISPOSABLE) ×4 IMPLANT
GOWN STRL REUS W/ TWL XL LVL3 (GOWN DISPOSABLE) ×4 IMPLANT
GOWN STRL REUS W/TWL LRG LVL3 (GOWN DISPOSABLE) ×4
GOWN STRL REUS W/TWL XL LVL3 (GOWN DISPOSABLE) ×4
HANDLE YANKAUER SUCT BULB TIP (MISCELLANEOUS) IMPLANT
KIT TURNOVER CYSTO (KITS) ×2 IMPLANT
LIGASURE IMPACT 36 18CM CVD LR (INSTRUMENTS) ×2 IMPLANT
MANIFOLD NEPTUNE II (INSTRUMENTS) ×2 IMPLANT
NDL HYPO 22X1.5 SAFETY MO (MISCELLANEOUS) ×2 IMPLANT
NEEDLE HYPO 22X1.5 SAFETY MO (MISCELLANEOUS) ×2 IMPLANT
NS IRRIG 1000ML POUR BTL (IV SOLUTION) ×2 IMPLANT
PACK BASIN MAJOR ARMC (MISCELLANEOUS) ×2 IMPLANT
PACK COLON CLEAN CLOSURE (MISCELLANEOUS) ×2 IMPLANT
RELOAD LINEAR CUT PROX 55 BLUE (ENDOMECHANICALS) ×4 IMPLANT
RELOAD PROXIMATE 75MM BLUE (ENDOMECHANICALS) IMPLANT
RELOAD STAPLE 55 3.8 BLU REG (ENDOMECHANICALS) IMPLANT
RELOAD STAPLE 75 3.8 BLU REG (ENDOMECHANICALS) IMPLANT
SOL PREP PVP 2OZ (MISCELLANEOUS) ×2 IMPLANT
SOLUTION PREP PVP 2OZ (MISCELLANEOUS) ×2 IMPLANT
SPONGE T-LAP 18X18 ~~LOC~~+RFID (SPONGE) ×8 IMPLANT
STAPLER CIRCULAR MANUAL XL 25 (STAPLE) IMPLANT
STAPLER CIRCULAR MANUAL XL 29 (STAPLE) IMPLANT
STAPLER CIRCULAR MANUAL XL 33 (STAPLE) IMPLANT
STAPLER GUN LINEAR PROX 60 (STAPLE) IMPLANT
STAPLER PROXIMATE 55 BLUE (STAPLE) IMPLANT
STAPLER PROXIMATE 75MM BLUE (STAPLE) IMPLANT
STAPLER SKIN PROX 35W (STAPLE) ×2 IMPLANT
SURGILUBE 2OZ TUBE FLIPTOP (MISCELLANEOUS) ×2 IMPLANT
SUT NYLON 2-0 (SUTURE) IMPLANT
SUT PDS AB 1 CT 36 (SUTURE) ×6 IMPLANT
SUT PDS AB 1 TP1 96 (SUTURE) ×4 IMPLANT
SUT PROLENE 3 0 SH DA (SUTURE) IMPLANT
SUT SILK 3-0 (SUTURE) ×4 IMPLANT
SUT VICRYL 2 0 18 UND BR (SUTURE) ×2 IMPLANT
SUT VICRYL 2-0 54IN ABS (SUTURE) IMPLANT
SYR 10ML LL (SYRINGE) ×2 IMPLANT
SYR 30ML LL (SYRINGE) ×2 IMPLANT
SYR TOOMEY IRRIG 70ML (MISCELLANEOUS) ×2 IMPLANT
SYRINGE TOOMEY IRRIG 70ML (MISCELLANEOUS) ×2 IMPLANT
TRAP FLUID SMOKE EVACUATOR (MISCELLANEOUS) ×2 IMPLANT
TRAY FOLEY MTR SLVR 16FR STAT (SET/KITS/TRAYS/PACK) ×2 IMPLANT
WATER STERILE IRR 500ML POUR (IV SOLUTION) ×2 IMPLANT

## 2022-10-04 NOTE — Progress Notes (Incomplete)
Progress Note   Patient: Stacy Gilmore ZOX:[id number] DOB: 08/02/1960 DOA: 09/28/2022     6 DOS: the patient was seen and examined on 10/04/2022   Brief hospital course: Stacy Gilmore is a 62 y.o. female with medical history significant of recurrent diverticulitis, asthma/COPD, GERD, presented with worsening of LLQ pain. Patient has a history of recurrent diverticulitis, first and second episode in January and May this year.  About 5 days ago patient started to have similar LLQ pain, episodic, subsided on its own.  Patient did not feel the pain significantly affected her daily life and did not come to seek medical advice.  This morning patient woke up with 10/10 sharp like pain in the lower left lower quadrant associated with 2 loose bowel movement and nausea but no vomiting she also had chills but no fever. She is admitted for further management, seen by surgery team advised conservative management. Pain control, diet advancement gradually. Her pain, nausea did not improve. Repeat CT abdomen though reassuring, she does not have clinical improvement. Surgery team recommended open sigmoid colectomy and possible small bowel resection planned for 10/04/22.  Assessment and Plan: Acute complicated diverticulitis with bowel perforation abscess formation and colon/intestine fistula formation Acute peritonitis secondary to above continue IV pain meds, IV antiemetics Continue ceftriaxone and Flagyl therapy. Repeat CT abdomen reviewed shows improvement, she has LLQ pain, nausea. General Surgery team recommended open sigmoid colectomy and possible small bowel resection planned for tomorrow. NPO past midnight.   Hypokalemia Resolved post supplements.  Leukopenia - WBC 2.4 stable, monitor daily CBC.   COPD No exacerbation. She is on room air. Continue bronchodilators PRN.   GERD Stable, continue PPI  Homeless TOC for discharge needs.  {Tip this will not be part of the note when signed  Body mass index is 24.54 kg/m. ,  Nutrition Documentation    Flowsheet Row ED to Hosp-Admission (Current) from 09/28/2022 in [facility]  Nutrition Problem Inadequate oral intake  Etiology acute illness  Nutrition Goal Patient will meet greater than or equal to 90% of their needs  Interventions Boost Breeze, MVI     ,  (Optional):26781}  Subjective: Patient is seen and examined today morning. She has left lower quadrant pain, tenderness. Feels nauseous. Able to tolerate clears. She understands surgical plan for tomorrow.  Physical Exam: Vitals:   10/04/22 1315 10/04/22 1330 10/04/22 1345 10/04/22 1429  BP: (!) 143/89 137/71 121/74 (!) 146/84  Pulse: 63 (!) 57 (!) 51 (!) 42  Resp: 15 (!) 21 19 17   Temp: (!) 97.5 F (36.4 C) 97.8 F (36.6 C)  97.8 F (36.6 C)  TempSrc:    Oral  SpO2: 95% 97% 95% 93%  Weight:      Height:       General - Middle aged Caucasian female, no apparent distress HEENT - PERRLA, EOMI, atraumatic head, non tender sinuses. Lung - Clear, rales, rhonchi, wheezes. Heart - S1, S2 heard, no murmurs, rubs, trace pedal edema. Abdomen - soft, LLQ tender, bowel sounds good. Neuro - Alert, awake and oriented x 3, non focal exam. Skin - Warm and dry. Data Reviewed:     Latest Ref Rng & Units 10/04/2022    6:19 AM 10/03/2022    4:25 AM 10/02/2022    5:31 AM  CBC  WBC 4.0 - 10.5 K/uL 3.0  2.4  2.3   Hemoglobin 12.0 - 15.0 g/dL 81.1  91.4  78.2   Hematocrit 36.0 - 46.0 %  36.9  37.6  36.8   Platelets 150 - 400 K/uL 228  213  176       Latest Ref Rng & Units 10/04/2022    6:19 AM 10/03/2022    4:25 AM 10/02/2022    5:31 AM  BMP  Glucose 70 - 99 mg/dL 244  010  272   BUN 8 - 23 mg/dL 6  <5  <5   Creatinine 0.44 - 1.00 mg/dL 5.36  6.44  0.34   Sodium 135 - 145 mmol/L 139  138  140   Potassium 3.5 - 5.1 mmol/L 3.7  3.7  3.9   Chloride 98 - 111 mmol/L 101  102  102   CO2 22 - 32 mmol/L 29  29  29    Calcium 8.9 - 10.3 mg/dL  8.5  8.9  8.6    [place] EKG SITE RITE  Result Date: 10/03/2022 If Site Rite image not attached, placement could not be confirmed due to current cardiac rhythm.    Family Communication: Homeless, understands and agrees with current care plan.  Disposition: Status is: Inpatient Remains inpatient appropriate because: Surgical intervention tomorrow.  Planned Discharge Destination: Barriers to discharge: homeless {Tip this will not be part of the note when signed  DVT Prophylaxis  ., Enoxaparin (lovenox) injection 40 mg  (Optional):26781}   Time spent: 41 minutes  Author: Marcelino Duster, MD 10/04/2022 3:06 PM  For on call review [email or web address].

## 2022-10-04 NOTE — Progress Notes (Signed)
Progress Note   Patient: Stacy Gilmore UVO:[id number] DOB: Sep 13, 1960 DOA: 09/28/2022     6 DOS: the patient was seen and examined on 10/04/2022   Brief hospital course: AVREE HEINERT is a 62 y.o. female with medical history significant of recurrent diverticulitis, asthma/COPD, GERD, presented with worsening of LLQ pain. Patient has a history of recurrent diverticulitis, first and second episode in January and May this year.  About 5 days ago patient started to have similar LLQ pain, episodic, subsided on its own.  Patient did not feel the pain significantly affected her daily life and did not come to seek medical advice.  This morning patient woke up with 10/10 sharp like pain in the lower left lower quadrant associated with 2 loose bowel movement and nausea but no vomiting she also had chills but no fever. She is admitted for further management, seen by surgery team advised conservative management. Pain control, diet advancement gradually. Her pain, nausea did not improve. Repeat CT abdomen though reassuring, she does not have clinical improvement. Surgery team recommended open sigmoid colectomy and possible small bowel resection planned for 10/04/22.  Assessment and Plan: Acute complicated diverticulitis with bowel perforation abscess formation and colon/intestine fistula formation Acute peritonitis secondary to above. S/p Partial colectomy,takedown of the splenic flexure, appendectomy 10/04/22. NPO with sips of water. TPN will be continued. continue IV pain meds, IV antiemetics Continue ceftriaxone and Flagyl therapy.  Hypokalemia Resolved post supplements.  Leukopenia - WBC 3 stable, monitor daily CBC.   COPD No exacerbation. She is on room air. Continue bronchodilators PRN.   GERD Stable, continue PPI  Homeless TOC for discharge needs.    Subjective: Patient is seen and examined today after surgery. She is lying comfortably. Has pain but better with meds. Significant  other at bedside.  Physical Exam: Vitals:   10/04/22 1315 10/04/22 1330 10/04/22 1345 10/04/22 1429  BP: (!) 143/89 137/71 121/74 (!) 146/84  Pulse: 63 (!) 57 (!) 51 (!) 42  Resp: 15 (!) 21 19 17   Temp: (!) 97.5 F (36.4 C) 97.8 F (36.6 C)  97.8 F (36.6 C)  TempSrc:    Oral  SpO2: 95% 97% 95% 93%  Weight:      Height:       General - Middle aged Caucasian female, no apparent distress HEENT - PERRLA, EOMI, atraumatic head, non tender sinuses. Lung - Clear, rales, rhonchi, wheezes. Heart - S1, S2 heard, no murmurs, rubs, trace pedal edema. Abdomen - soft, LLQ tender, bowel sounds good. Neuro - Alert, awake and oriented x 3, non focal exam. Skin - Warm and dry. Data Reviewed:     Latest Ref Rng & Units 10/04/2022    6:19 AM 10/03/2022    4:25 AM 10/02/2022    5:31 AM  CBC  WBC 4.0 - 10.5 K/uL 3.0  2.4  2.3   Hemoglobin 12.0 - 15.0 g/dL 74.2  59.5  63.8   Hematocrit 36.0 - 46.0 % 36.9  37.6  36.8   Platelets 150 - 400 K/uL 228  213  176       Latest Ref Rng & Units 10/04/2022    6:19 AM 10/03/2022    4:25 AM 10/02/2022    5:31 AM  BMP  Glucose 70 - 99 mg/dL 756  433  295   BUN 8 - 23 mg/dL 6  <5  <5   Creatinine 0.44 - 1.00 mg/dL 1.88  4.16  6.06   Sodium 135 - 145 mmol/L 139  138  140   Potassium 3.5 - 5.1 mmol/L 3.7  3.7  3.9   Chloride 98 - 111 mmol/L 101  102  102   CO2 22 - 32 mmol/L 29  29  29    Calcium 8.9 - 10.3 mg/dL 8.5  8.9  8.6    [place] EKG SITE RITE  Result Date: 10/03/2022 If Site Rite image not attached, placement could not be confirmed due to current cardiac rhythm.    Family Communication: significant other at bedside, understands and agrees with current care plan.  Disposition: Status is: Inpatient Remains inpatient appropriate because: post surgical intervention.  Planned Discharge Destination: Barriers to discharge: homeless    Time spent: 40 minutes  Author: Marcelino Duster, MD 10/04/2022 4:11 PM  For on call review  [email or web address].

## 2022-10-04 NOTE — Progress Notes (Signed)
Notified Dr. Claudine Mouton that the patient had been given Boost  per ordered in the [facility] about 237 mL in spite of NPO ordered. Surgeon acknowledge and ordered to change NPO to NPO with sips, ice chips and continue giving Boost as ordered.

## 2022-10-04 NOTE — Progress Notes (Addendum)
Pt back from procedure. Pt aox4, respirations even and unlabored on 2L. Pt having pain, see mar. Dressing visualized with PACU RN, new drainage noted to honeycomb dressing. Vitals stable at this time. Foley remains in place.

## 2022-10-04 NOTE — Consult Note (Signed)
PHARMACY - TOTAL PARENTERAL NUTRITION CONSULT NOTE   Indication:  Poor PO intake with planned surgical intervention   Patient Measurements: Height: 5' 6.5" (168.9 cm) Weight: 70 kg (154 lb 5.2 oz) IBW/kg (Calculated) : 60.45 TPN AdjBW (KG): 61.2 Body mass index is 24.54 kg/m.   Assessment: 62 yo female with PMH including recurrent diverticulitis, asthma/COPD, GERD.  Patient presented due to abdominal pain and was diagnosed with diverticulitis.  Currently receiving IV antibiotics with ceftriaxone and flagyl.  Plan now if for patient to go to OR on 9/20 for sigmoid colectomy.  Glucose / Insulin: No PMH of diabetes BG 103 - 117, no SSI required Electrolytes: WNL Renal: Scr < 1 Hepatic: LFTs mildly elevated in admission will order labs for tomorrow Intake / Output; MIVF: N/A GI Imaging: 9/18 CT abd/pel: Diverticulitis with abscess GI Surgeries / Procedures:  Sigmoid colectomy planned for 9/20  Central access: Order for PICC line placed TPN start date: 10/03/22  Nutritional Goals: Goal TPN rate is 65 mL/hr (provides 87.4 g of protein and 1685 kcals per day)  RD Assessment: Estimated Needs Total Energy Estimated Needs: 1600-1800kcal/day Total Protein Estimated Needs: 80-90g/day Total Fluid Estimated Needs: 1.7-2.0L/day  Current Nutrition:  Clear liquids  Plan:  ---advance TPN to goal rate of 65 mL/hr at 1800 ---Electrolytes in TPN (standard): Na 70mEq/L, K 84mEq/L, Ca 39mEq/L, Mg 61mEq/L, and Phos 38mmol/L. Cl:Ac 1:1 ---Add standard MVI and trace elements to TPN ---continue Sensitive q8h SSI and adjust as needed  ---Monitor TPN labs on Mon/Thurs, and daily until labs are stable  Lowella Bandy, PharmD 10/04/2022,8:40 AM

## 2022-10-04 NOTE — Op Note (Signed)
SURGICAL OPERATIVE REPORT  DATE OF PROCEDURE: 10/04/2022  ATTENDING: Surgeon(s): [name], MD  ASSISTANT(S): Laqueta Due, PA-C.  A first assist was necessary due to the technical complexity of the case as well as the need for adequate exposure.  Please note that Mr. Stacy Gilmore was scrubbed in for the entirety of the case.  His assistance was critical due to the complexity of the case, and he assisted with the exposure, resection, hemostasis, anastomotic creation, and clean closure. His presence was required for the successful and safe performance of the procedure. I performed the critical portions of the procedure.   ANESTHESIA: GETA  PRE-OPERATIVE DIAGNOSIS: Complicated diverticulitis (K57.20) of the sigmoid/descending colon  POST-OPERATIVE DIAGNOSIS: Same.   PROCEDURE(S):  1.) Partial colectomy with primary (44145)/EEA stapled anastomosis 2.) Takedown of the splenic flexure (30865) 3.)  Incidental appendectomy   INTRAOPERATIVE FINDINGS: Portion of abscess wall created by 2 separate loops of small bowel.  No evidence of obstruction, no evidence of small bowel fistula.  ESTIMATED BLOOD LOSS: 150 mL   SPECIMENS: Sigmoid colon, appendix  IMPLANTS: None  DRAINS: None   COMPLICATIONS: None apparent   CONDITION AT END OF PROCEDURE: Hemodynamically stable and extubated   DISPOSITION OF PATIENT: PACU  INDICATIONS FOR PROCEDURE:   Patient is a 62 y.o. female who presented with recurrent complicated diverticulitis with small nondrainable abscess, follow-up imaging suggesting possibility of small bowel fistula communicating with abscess. All risks, benefits, and alternatives to above procedure were discussed with the patient, all of patient's questions were answered to her expressed satisfaction, and informed consent was obtained and documented.  DETAILS OF PROCEDURE: Patient was brought to the operating suite and appropriately identified. General anesthesia was administered  along with appropriate pre-operative antibiotics, and endotracheal intubation was performed by anesthetist. In lithotomy position, operative site was prepped and draped in the usual sterile fashion, and following a brief time out, a lower vertical midline incision was made from the Right side of the umbilicus inferiorly to just above the pubis using a #10 blade scalpel and extended deep through subcutaneous tissues until fascia was visualized and exposed along the linea alba midline between the rectus abdominal muscles. Lower midline fascia was then divided in the midline superiorly to the Right of the umbilicus in case creation of a colostomy was found to be required. Upon dissecting through preperitoneal fat, the peritoneum was entered and opened along the length of the incision, taking care to avoid injury to underlying non-dilated bowel.  Firm mass was palpated in the left lower quadrant involving 2 small bowel loops adjacent to the distal descending/proximal sigmoid colon. Abdominal cavity was explored, and the liver was palpated with neither visible nor palpable evidence of hepatic, omental, or peritoneal metastases.  The Bookwalter retractor was placed for adequate exposure.  Utilizing combination of sharp dissection with Metzenbaum scissors along with digital blunt dissection to allow adequate separation of the 2 small bowel loops from the abscess cavity and the adjacent sigmoid colon.  Both loops of small bowel were then separated from 1 another, confirming no compromise to the small bowel lumen, no evidence of serosal injury, nor evidence of fistulization. The small bowel was then retracted to the superiorly and to the Right, enabling placement of  self-retaining retractor. The descending colon was then retracted medially. Using a combination of sharp dissection and electrocautery, the colon was freed from its lateral peritoneal attachments along the white line of Toldt distally approaching the  rectosigmoid junction and proximally from the splenic flexure. During  this dissection, injury to the ureters was avoided, the Left ureter of which was/were identified and protected. The splenic flexure needed to be taken down in this case. Points of transection were selected distally and proximally, and the proximal bowel was stapled and divided using a GIA-55 linear cutting stapler, while the distal colon was stapled and divided using a GIA linear cutting stapler. Peritoneum was then scored along the mesentary with electrocautery, and the vessels were cauterized, sealed, and cut using the Ligasure. Colon specimen was then handed off of the field as specimen for pathology.   The distal descending colon was then mobilized medially from its left sided attachments, completely freeing the splenic flexure from the left upper quadrant. We elected to proceed with a Baker type end to side anastomosis utilizing a 25 mm EEA stapler.  We excised the proximal rectal staple line, and due to inability to pass the sizer, proceeded with additional excision of the proximal rectum, finding sufficient lumen to accommodate this with the sizer.  We then placed the anvil within the proximal rectum, a pursestring of 3-0 Prolene was utilized to secure it. Then excised the distal descending colon staple line, and dilating its lumen with a 25 mm sizer, passed the EEA stapler proximally, and successfully created a tension-free well-vascularized side of descending colon to end of proximal rectum anastomosis. Zach then went below and utilizing catheters was able to instill sufficient air to distend the rectum through the anastomosis, and with saline in the pelvis, I confirmed that there was no evidence of air leak. The cecum with the appendix was in the pelvis, I felt it prudent to remove a normal-appearing appendix since it would lie adjacent to the anastomosis within the pelvis.  Took down the mesoappendix with the LigaSure, and using a  GIA stapler divided the appendix at the cecal junction.  This was sent as specimen.  We then proceeded with irrigation of the abdominal cavity with multiple aliquots of warm normal saline solution.  Confirmed adequate hemostasis.    We then transition to clean closure.  I then utilized a mixture of 20 mL of Exparel with 30 mL of quarter percent Marcaine with epi, and 10 mL of saline to make a total of 60 mL of local anesthetic solution which is infiltrated into the fascial planes bilaterally. We then closed the midline fascia with a running 1 PDS.  We then irrigated subcutaneous tissues, confirmed adequate hemostasis and closed the skin loosely with staples.  Skin was then cleaned and dried, and a sterile dressing was applied.   Patient was then safely able to be extubated, awakened, and transferred to PACU for post-operative monitoring and care.  I was present for all aspects of the above procedure, and no operative complications were apparent.

## 2022-10-04 NOTE — Anesthesia Postprocedure Evaluation (Signed)
Anesthesia Post Note  Patient: Stacy Gilmore  Procedure(s) Performed: COLON RESECTION SIGMOID EXPLORATION LAPAROTOMY APPENDECTOMY (Abdomen)  Patient location during evaluation: PACU Anesthesia Type: General Level of consciousness: awake and alert Pain management: pain level controlled Vital Signs Assessment: post-procedure vital signs reviewed and stable Respiratory status: spontaneous breathing, nonlabored ventilation, respiratory function stable and patient connected to nasal cannula oxygen Cardiovascular status: blood pressure returned to baseline and stable Postop Assessment: no apparent nausea or vomiting Anesthetic complications: no   No notable events documented.   Last Vitals:  Vitals:   10/04/22 1345 10/04/22 1429  BP: 121/74 (!) 146/84  Pulse: (!) 51 (!) 42  Resp: 19 17  Temp:  36.6 C  SpO2: 95% 93%    Last Pain:  Vitals:   10/04/22 1429  TempSrc: Oral  PainSc:                  [name]

## 2022-10-04 NOTE — Progress Notes (Signed)
[facility] SURGICAL PROGRESS NOTE (cpt [phone])  Hospital Day(s): 6.   Interval History:  Patient seen and examined No acute events or new complaints overnight.  Patient reports she continues to have very localized LLQ pain.  Still with nausea; no emesis.  No fever, chills.  She is mildly leukopenic this AM to 3.0K.  Hgb to 10.9.  Renal function normal with sCr - 0.52; UO - unmeasured  She is having bowel function; tolerated prep  Vital signs in last 24 hours: [min-max] current  Temp:  [97.7 F (36.5 C)-98.1 F (36.7 C)] 97.9 F (36.6 C) (09/20 0403) Pulse Rate:  [48-56] 48 (09/20 0403) Resp:  [16-18] 18 (09/20 0403) BP: (108-125)/(58-94) 108/68 (09/20 0403) SpO2:  [94 %-98 %] 94 % (09/20 0403) Weight:  [70 kg] 70 kg (09/20 0402)     Height: 5' 6.5" (168.9 cm) Weight: 70 kg BMI (Calculated): 24.54   Intake/Output last 2 shifts:  09/19 0701 - 09/20 0700 In: 20 [I.V.:20] Out: -    Physical Exam:  Constitutional: alert, cooperative and no distress  HENT: normocephalic without obvious abnormality  Eyes: PERRL, EOM's grossly intact and symmetric  Respiratory: breathing non-labored at rest  Cardiovascular: regular rate and sinus rhythm  Gastrointestinal: soft, she has LLQ point tenderness which is similar to previous examination, she is non-distended, no rebound/guarding. She is certainly not peritonitic Musculoskeletal: no edema or wounds, motor and sensation grossly intact, NT    Labs:     Latest Ref Rng & Units 10/04/2022    6:19 AM 10/03/2022    4:25 AM 10/02/2022    5:31 AM  CBC  WBC 4.0 - 10.5 K/uL 3.0  2.4  2.3   Hemoglobin 12.0 - 15.0 g/dL 41.3  24.4  01.0   Hematocrit 36.0 - 46.0 % 36.9  37.6  36.8   Platelets 150 - 400 K/uL 228  213  176       Latest Ref Rng & Units 10/04/2022    6:19 AM 10/03/2022    4:25 AM 10/02/2022    5:31 AM  CMP  Glucose 70 - 99 mg/dL 272  536  644   BUN 8 - 23 mg/dL 6  <5  <5   Creatinine 0.44 - 1.00 mg/dL 0.34  7.42   5.95   Sodium 135 - 145 mmol/L 139  138  140   Potassium 3.5 - 5.1 mmol/L 3.7  3.7  3.9   Chloride 98 - 111 mmol/L 101  102  102   CO2 22 - 32 mmol/L 29  29  29    Calcium 8.9 - 10.3 mg/dL 8.5  8.9  8.6   Total Protein 6.5 - 8.1 g/dL 6.7     Total Bilirubin 0.3 - 1.2 mg/dL 0.4     Alkaline Phos 38 - 126 U/L 57     AST 15 - 41 U/L 46     ALT 0 - 44 U/L 46        Imaging studies: No new pertinent imaging studies   Assessment/Plan: (ICD-10's: K9.92) 62 y.o. 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   - All risks, benefits, and alternatives to above procedure(s) were discussed with the patient, all of her questions were answered to her expressed satisfaction, patient expresses she wishes to proceed, and informed consent was obtained.   - NPO for surgery  - Continue  TPN  - Continue IV Abx (Rocephin/Flagyl)  - Monitor abdominal examination  - Pain control prn; antiemetics prn   - Mobilize - Further management per primary service; we will follow  All of the above findings and recommendations were discussed with the patient, and the medical team, and all of patient's questions were answered to her expressed satisfaction.  -- Lynden Oxford, PA-C [facility] 10/04/2022, 8:03 AM M-F: 7am - 4pm

## 2022-10-04 NOTE — H&P (View-Only) (Signed)
[facility] SURGICAL PROGRESS NOTE (cpt [phone])  Hospital Day(s): 6.   Interval History:  Patient seen and examined No acute events or new complaints overnight.  Patient reports she continues to have very localized LLQ pain.  Still with nausea; no emesis.  No fever, chills.  She is mildly leukopenic this AM to 3.0K.  Hgb to 10.9.  Renal function normal with sCr - 0.52; UO - unmeasured  She is having bowel function; tolerated prep  Vital signs in last 24 hours: [min-max] current  Temp:  [97.7 F (36.5 C)-98.1 F (36.7 C)] 97.9 F (36.6 C) (09/20 0403) Pulse Rate:  [48-56] 48 (09/20 0403) Resp:  [16-18] 18 (09/20 0403) BP: (108-125)/(58-94) 108/68 (09/20 0403) SpO2:  [94 %-98 %] 94 % (09/20 0403) Weight:  [70 kg] 70 kg (09/20 0402)     Height: 5' 6.5" (168.9 cm) Weight: 70 kg BMI (Calculated): 24.54   Intake/Output last 2 shifts:  09/19 0701 - 09/20 0700 In: 20 [I.V.:20] Out: -    Physical Exam:  Constitutional: alert, cooperative and no distress  HENT: normocephalic without obvious abnormality  Eyes: PERRL, EOM's grossly intact and symmetric  Respiratory: breathing non-labored at rest  Cardiovascular: regular rate and sinus rhythm  Gastrointestinal: soft, she has LLQ point tenderness which is similar to previous examination, she is non-distended, no rebound/guarding. She is certainly not peritonitic Musculoskeletal: no edema or wounds, motor and sensation grossly intact, NT    Labs:     Latest Ref Rng & Units 10/04/2022    6:19 AM 10/03/2022    4:25 AM 10/02/2022    5:31 AM  CBC  WBC 4.0 - 10.5 K/uL 3.0  2.4  2.3   Hemoglobin 12.0 - 15.0 g/dL 41.3  24.4  01.0   Hematocrit 36.0 - 46.0 % 36.9  37.6  36.8   Platelets 150 - 400 K/uL 228  213  176       Latest Ref Rng & Units 10/04/2022    6:19 AM 10/03/2022    4:25 AM 10/02/2022    5:31 AM  CMP  Glucose 70 - 99 mg/dL 272  536  644   BUN 8 - 23 mg/dL 6  <5  <5   Creatinine 0.44 - 1.00 mg/dL 0.34  7.42   5.95   Sodium 135 - 145 mmol/L 139  138  140   Potassium 3.5 - 5.1 mmol/L 3.7  3.7  3.9   Chloride 98 - 111 mmol/L 101  102  102   CO2 22 - 32 mmol/L 29  29  29    Calcium 8.9 - 10.3 mg/dL 8.5  8.9  8.6   Total Protein 6.5 - 8.1 g/dL 6.7     Total Bilirubin 0.3 - 1.2 mg/dL 0.4     Alkaline Phos 38 - 126 U/L 57     AST 15 - 41 U/L 46     ALT 0 - 44 U/L 46        Imaging studies: No new pertinent imaging studies   Assessment/Plan: (ICD-10's: K9.92) 62 y.o. 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   - All risks, benefits, and alternatives to above procedure(s) were discussed with the patient, all of her questions were answered to her expressed satisfaction, patient expresses she wishes to proceed, and informed consent was obtained.   - NPO for surgery  - Continue  TPN  - Continue IV Abx (Rocephin/Flagyl)  - Monitor abdominal examination  - Pain control prn; antiemetics prn   - Mobilize - Further management per primary service; we will follow  All of the above findings and recommendations were discussed with the patient, and the medical team, and all of patient's questions were answered to her expressed satisfaction.  -- Lynden Oxford, PA-C [facility] 10/04/2022, 8:03 AM M-F: 7am - 4pm

## 2022-10-04 NOTE — Interval H&P Note (Signed)
History and Physical Interval Note:  10/04/2022 9:22 AM  Stacy Gilmore [name]  has presented today for surgery, with the diagnosis of Recurrent diverticulitis with small bowel fistula.  The various methods of treatment have been discussed with the patient and family. After consideration of risks, benefits and other options for treatment, the patient has consented to  Procedure(s): COLON RESECTION SIGMOID (N/A) EXPLORATION LAPAROTOMY (N/A) SMALL BOWEL RESECTION (N/A) as a surgical intervention.  The patient's history has been reviewed, patient examined, no change in status, stable for surgery.  I have reviewed the patient's chart and labs.  Questions were answered to the patient's satisfaction.     [name]

## 2022-10-04 NOTE — Anesthesia Preprocedure Evaluation (Signed)
Anesthesia Evaluation  Patient identified by MRN, date of birth, ID band Patient awake    Reviewed: Allergy & Precautions, NPO status , Patient's Chart, lab work & pertinent test results  History of Anesthesia Complications Negative for: history of anesthetic complications  Airway Mallampati: II  TM Distance: >3 FB Neck ROM: Full    Dental  (+) Edentulous Upper, Edentulous Lower   Pulmonary asthma , neg sleep apnea, COPD,  COPD inhaler, Current Smoker and Patient abstained from smoking.   Pulmonary exam normal breath sounds clear to auscultation       Cardiovascular Exercise Tolerance: Good METShypertension, Pt. on medications + CAD  (-) Past MI (-) dysrhythmias  Rhythm:Regular Rate:Normal - Systolic murmurs    Neuro/Psych negative neurological ROS  negative psych ROS   GI/Hepatic ,GERD  Medicated,,(+)     substance abuse  alcohol use, cocaine use and marijuana useDrinks "a 40 per day", none in past two weeks.  No cocaine use in last week, per patient. Does not appear acutely intoxicated   Endo/Other  neg diabetes    Renal/GU negative Renal ROS     Musculoskeletal   Abdominal   Peds  Hematology   Anesthesia Other Findings Past Medical History: 06/19/2022: Acute diverticulitis No date: Asthma No date: COPD (chronic obstructive pulmonary disease) (HCC) 07/25/2022: Diverticulitis of colon No date: GERD (gastroesophageal reflux disease) No date: History of diverticulitis  Reproductive/Obstetrics                             Anesthesia Physical Anesthesia Plan  ASA: 2  Anesthesia Plan: General   Post-op Pain Management: Ofirmev IV (intra-op)*   Induction: Intravenous  PONV Risk Score and Plan: 4 or greater and Ondansetron, Dexamethasone and Midazolam  Airway Management Planned: Oral ETT  Additional Equipment: None  Intra-op Plan:   Post-operative Plan: Extubation in  OR  Informed Consent: I have reviewed the patients History and Physical, chart, labs and discussed the procedure including the risks, benefits and alternatives for the proposed anesthesia with the patient or authorized representative who has indicated his/her understanding and acceptance.     Dental advisory given  Plan Discussed with: CRNA and Surgeon  Anesthesia Plan Comments: (Discussed risks of anesthesia with patient, including PONV, sore throat, lip/dental/eye damage. Rare risks discussed as well, such as cardiorespiratory and neurological sequelae, and allergic reactions. Discussed the role of CRNA in patient's perioperative care. Patient understands. Patient counseled on benefits of smoking cessation, and increased perioperative risks associated with continued smoking. )       Anesthesia Quick Evaluation

## 2022-10-04 NOTE — Transfer of Care (Signed)
Immediate Anesthesia Transfer of Care Note  Patient: Stacy Gilmore  Procedure(s) Performed: COLON RESECTION SIGMOID EXPLORATION LAPAROTOMY APPENDECTOMY (Abdomen)  Patient Location: PACU  Anesthesia Type:General  Level of Consciousness: awake, alert , and drowsy  Airway & Oxygen Therapy: Patient Spontanous Breathing  Post-op Assessment: Report given to RN and Post -op Vital signs reviewed and stable  Post vital signs: Reviewed and stable  Last Vitals:  Vitals Value Taken Time  BP 116/91 10/04/22 1247  Temp 35.9 1247  Pulse 69 10/04/22 1252  Resp 17 10/04/22 1252  SpO2 94 % 10/04/22 1251  Vitals shown include unfiled device data.  Last Pain:  Vitals:   10/04/22 0855  TempSrc: Temporal  PainSc: 8       Patients Stated Pain Goal: 0 (10/04/22 9811)  Complications: No notable events documented.

## 2022-10-04 NOTE — Anesthesia Procedure Notes (Signed)
Procedure Name: Intubation Date/Time: 10/04/2022 9:43 AM  Performed by: Morene Crocker, CRNAPre-anesthesia Checklist: Patient identified, Patient being monitored, Timeout performed, Emergency Drugs available and Suction available Patient Re-evaluated:Patient Re-evaluated prior to induction Oxygen Delivery Method: Circle system utilized Preoxygenation: Pre-oxygenation with 100% oxygen Induction Type: IV induction and Rapid sequence Laryngoscope Size: 3 and McGraph Grade View: Grade I Tube type: Oral Tube size: 6.5 mm Number of attempts: 1 Airway Equipment and Method: Stylet Placement Confirmation: ETT inserted through vocal cords under direct vision, positive ETCO2 and breath sounds checked- equal and bilateral Secured at: 21 cm Tube secured with: Tape Dental Injury: Teeth and Oropharynx as per pre-operative assessment  Comments: Smooth atraumatic intubation. No complications noted.

## 2022-10-05 DIAGNOSIS — Z5900 Homelessness unspecified: Secondary | ICD-10-CM | POA: Diagnosis not present

## 2022-10-05 DIAGNOSIS — J439 Emphysema, unspecified: Secondary | ICD-10-CM | POA: Diagnosis not present

## 2022-10-05 DIAGNOSIS — K5720 Diverticulitis of large intestine with perforation and abscess without bleeding: Secondary | ICD-10-CM | POA: Diagnosis not present

## 2022-10-05 DIAGNOSIS — E876 Hypokalemia: Secondary | ICD-10-CM | POA: Diagnosis not present

## 2022-10-05 LAB — CBC WITH DIFFERENTIAL/PLATELET
Abs Immature Granulocytes: 0.05 10*3/uL (ref 0.00–0.07)
Basophils Absolute: 0 10*3/uL (ref 0.0–0.1)
Basophils Relative: 0 %
Eosinophils Absolute: 0 10*3/uL (ref 0.0–0.5)
Eosinophils Relative: 0 %
HCT: 35.8 % — ABNORMAL LOW (ref 36.0–46.0)
Hemoglobin: 11.5 g/dL — ABNORMAL LOW (ref 12.0–15.0)
Immature Granulocytes: 1 %
Lymphocytes Relative: 9 %
Lymphs Abs: 1 10*3/uL (ref 0.7–4.0)
MCH: 28.2 pg (ref 26.0–34.0)
MCHC: 32.1 g/dL (ref 30.0–36.0)
MCV: 87.7 fL (ref 80.0–100.0)
Monocytes Absolute: 0.8 10*3/uL (ref 0.1–1.0)
Monocytes Relative: 7 %
Neutro Abs: 9.1 10*3/uL — ABNORMAL HIGH (ref 1.7–7.7)
Neutrophils Relative %: 83 %
Platelets: 267 10*3/uL (ref 150–400)
RBC: 4.08 MIL/uL (ref 3.87–5.11)
RDW: 13.4 % (ref 11.5–15.5)
WBC: 10.9 10*3/uL — ABNORMAL HIGH (ref 4.0–10.5)
nRBC: 0 % (ref 0.0–0.2)

## 2022-10-05 LAB — RENAL FUNCTION PANEL
Albumin: 3.1 g/dL — ABNORMAL LOW (ref 3.5–5.0)
Anion gap: 8 (ref 5–15)
BUN: 8 mg/dL (ref 8–23)
CO2: 26 mmol/L (ref 22–32)
Calcium: 8.7 mg/dL — ABNORMAL LOW (ref 8.9–10.3)
Chloride: 104 mmol/L (ref 98–111)
Creatinine, Ser: 0.5 mg/dL (ref 0.44–1.00)
GFR, Estimated: >60 mL/min (ref >60)
Glucose, Bld: 142 mg/dL — ABNORMAL HIGH (ref 70–99)
Phosphorus: 3 mg/dL (ref 2.5–4.6)
Potassium: 3.7 mmol/L (ref 3.5–5.1)
Sodium: 138 mmol/L (ref 135–145)

## 2022-10-05 LAB — GLUCOSE, CAPILLARY
Glucose-Capillary: 121 mg/dL — ABNORMAL HIGH (ref 70–99)
Glucose-Capillary: 122 mg/dL — ABNORMAL HIGH (ref 70–99)
Glucose-Capillary: 123 mg/dL — ABNORMAL HIGH (ref 70–99)
Glucose-Capillary: 149 mg/dL — ABNORMAL HIGH (ref 70–99)

## 2022-10-05 LAB — MAGNESIUM: Magnesium: 1.7 mg/dL (ref 1.7–2.4)

## 2022-10-05 MED ORDER — TRAVASOL 10 % IV SOLN
INTRAVENOUS | Status: AC
  Filled 2022-10-05: qty 873.6

## 2022-10-05 NOTE — Progress Notes (Signed)
Progress Note   Patient: Stacy Gilmore ONG:[id number] DOB: 02/02/1960 DOA: 09/28/2022     7 DOS: the patient was seen and examined on 10/05/2022   Brief hospital course: RUPINDER VICKNAIR is a 62 y.o. female with medical history significant of recurrent diverticulitis, asthma/COPD, GERD, presented with worsening of LLQ pain. Patient has a history of recurrent diverticulitis, first and second episode in January and May this year.  About 5 days ago patient started to have similar LLQ pain, episodic, subsided on its own.  Patient did not feel the pain significantly affected her daily life and did not come to seek medical advice.  This morning patient woke up with 10/10 sharp like pain in the lower left lower quadrant associated with 2 loose bowel movement and nausea but no vomiting she also had chills but no fever. She is admitted for further management, seen by surgery team advised conservative management. Pain control, diet advancement gradually. Her pain, nausea did not improve. Repeat CT abdomen though reassuring, she does not have clinical improvement. Surgery team recommended open sigmoid colectomy and possible small bowel resection planned for 10/04/22.  Assessment and Plan: Acute complicated diverticulitis with bowel perforation abscess formation and colon/intestine fistula formation Acute peritonitis secondary to above. S/p Partial colectomy,takedown of the splenic flexure, appendectomy 10/04/22. NPO with sips of water. Continue TPN. Continue IV pain meds, IV antiemetics Continue ceftriaxone and Flagyl therapy.  Hypokalemia Resolved post supplements.  Leukopenia - WBC 3 stable, monitor daily CBC.   COPD No exacerbation. She is on room air. Continue bronchodilators PRN.   GERD Stable, continue PPI  Homeless TOC for discharge needs.    Subjective: Patient is seen and examined today after surgery. She is lying comfortably.  Pain improved, has some nausea.  Tolerating TPN, she  is n.p.o. on ice chips.  Physical Exam: Vitals:   10/04/22 2014 10/04/22 2226 10/05/22 0458 10/05/22 0757  BP: 108/75 (!) 156/89 (!) 150/81 119/73  Pulse: (!) 59 89 65 (!) 57  Resp: 17  17 16   Temp: 97.8 F (36.6 C) 98.2 F (36.8 C) 97.8 F (36.6 C) 97.7 F (36.5 C)  TempSrc:  Oral  Oral  SpO2: 97% 95% 98% 96%  Weight:      Height:       General - Middle aged Caucasian female, no apparent distress HEENT - PERRLA, EOMI, atraumatic head, non tender sinuses. Lung - Clear, rales, rhonchi, wheezes. Heart - S1, S2 heard, no murmurs, rubs, trace pedal edema. Abdomen - soft, midline sutures noted, no bleeding or discharge Neuro - Alert, awake and oriented x 3, non focal exam. Skin - Warm and dry. Data Reviewed:     Latest Ref Rng & Units 10/05/2022    8:23 AM 10/04/2022    6:19 AM 10/03/2022    4:25 AM  CBC  WBC 4.0 - 10.5 K/uL 10.9  3.0  2.4   Hemoglobin 12.0 - 15.0 g/dL 44.0  10.2  72.5   Hematocrit 36.0 - 46.0 % 35.8  36.9  37.6   Platelets 150 - 400 K/uL 267  228  213       Latest Ref Rng & Units 10/05/2022    5:12 AM 10/04/2022    6:19 AM 10/03/2022    4:25 AM  BMP  Glucose 70 - 99 mg/dL 366  440  347   BUN 8 - 23 mg/dL 8  6  <5   Creatinine 4.25 - 1.00 mg/dL 9.56  3.87  5.64   Sodium  135 - 145 mmol/L 138  139  138   Potassium 3.5 - 5.1 mmol/L 3.7  3.7  3.7   Chloride 98 - 111 mmol/L 104  101  102   CO2 22 - 32 mmol/L 26  29  29    Calcium 8.9 - 10.3 mg/dL 8.7  8.5  8.9    No results found.   Family Communication: significant other at bedside, understands and agrees with current care plan.  Disposition: Status is: Inpatient Remains inpatient appropriate because: Status post colectomy, she is n.p.o., on TPN, IV pain medications  Planned Discharge Destination: Barriers to discharge: homeless    Time spent: 42 minutes  Author: Marcelino Duster, MD 10/05/2022 3:06 PM  For on call review [email or web address].

## 2022-10-05 NOTE — Consult Note (Signed)
PHARMACY - TOTAL PARENTERAL NUTRITION CONSULT NOTE   Indication:  Poor PO intake with planned surgical intervention   Patient Measurements: Height: 5' 6.5" (168.9 cm) Weight: 70 kg (154 lb 5.2 oz) IBW/kg (Calculated) : 60.45 TPN AdjBW (KG): 61.2 Body mass index is 24.54 kg/m.   Assessment: 62 yo female with PMH including recurrent diverticulitis, asthma/COPD, GERD.  Patient presented due to abdominal pain and was diagnosed with diverticulitis.  Currently receiving IV antibiotics with ceftriaxone and flagyl.  Plan now if for patient to go to OR on 9/20 for sigmoid colectomy.  Glucose / Insulin: No PMH of diabetes BG 116 - 168, 5 units used in last 24hr Electrolytes: WNL Renal: Scr < 1 Hepatic: LFTs mildly elevated in admission will order labs for tomorrow Intake / Output; MIVF: N/A GI Imaging: 9/18 CT abd/pel: Diverticulitis with abscess GI Surgeries / Procedures:  Sigmoid colectomy planned for 9/20  Central access: Order for PICC line placed TPN start date: 10/03/22  Nutritional Goals: Goal TPN rate is 65 mL/hr (provides 87.4 g of protein and 1685 kcals per day)  RD Assessment: Estimated Needs Total Energy Estimated Needs: 1600-1800kcal/day Total Protein Estimated Needs: 80-90g/day Total Fluid Estimated Needs: 1.7-2.0L/day  Current Nutrition:  Clear liquids  Plan:  ---Continue TPN at goal rate of 65 mL/hr at 1800 ---Electrolytes in TPN (standard): Na 87mEq/L, K 57mEq/L, Ca 106mEq/L, Mg 50mEq/L, and Phos 107mmol/L. Cl:Ac 1:1 ---Add standard MVI and trace elements to TPN ---continue Sensitive q8h SSI and adjust as needed  ---Monitor TPN labs on Mon/Thurs, and daily until labs are stable  Bettey Costa, PharmD 10/05/2022,9:03 AM

## 2022-10-05 NOTE — Progress Notes (Signed)
[facility] SURGICAL PROGRESS NOTE (cpt [phone])  Hospital Day(s): 7.   Interval History:  Patient seen and examined No acute events or new complaints overnight.  Patient reports she continues to have very localized right sided pain.  Denies nausea; no emesis.  No fever, chills.  Denies flatus, reports belching  Vital signs in last 24 hours: [min-max] current  Temp:  [97.5 F (36.4 C)-98.2 F (36.8 C)] 97.7 F (36.5 C) (09/21 0757) Pulse Rate:  [42-89] 57 (09/21 0757) Resp:  [15-21] 16 (09/21 0757) BP: (106-156)/(71-91) 119/73 (09/21 0757) SpO2:  [93 %-98 %] 96 % (09/21 0757)     Height: 5' 6.5" (168.9 cm) Weight: 70 kg BMI (Calculated): 24.54   Intake/Output last 2 shifts:  09/20 0701 - 09/21 0700 In: 1570 [I.V.:1420; IV Piggyback:150] Out: 800 [Urine:650; Blood:150]   Physical Exam:  Constitutional: alert, cooperative and no distress  HENT: normocephalic without obvious abnormality  Eyes: PERRL, EOM's grossly intact and symmetric  Respiratory: breathing non-labored at rest  Cardiovascular: regular rate and sinus rhythm  Gastrointestinal: soft, she has blood staining of the inferior portion of her honeycomb dressing, no evidence of active bleeding or continued drainage, she is non-distended, no rebound/guarding.  Musculoskeletal: no edema or wounds, motor and sensation grossly intact, NT    Labs:     Latest Ref Rng & Units 10/04/2022    6:19 AM 10/03/2022    4:25 AM 10/02/2022    5:31 AM  CBC  WBC 4.0 - 10.5 K/uL 3.0  2.4  2.3   Hemoglobin 12.0 - 15.0 g/dL 46.9  62.9  52.8   Hematocrit 36.0 - 46.0 % 36.9  37.6  36.8   Platelets 150 - 400 K/uL 228  213  176       Latest Ref Rng & Units 10/05/2022    5:12 AM 10/04/2022    6:19 AM 10/03/2022    4:25 AM  CMP  Glucose 70 - 99 mg/dL 413  244  010   BUN 8 - 23 mg/dL 8  6  <5   Creatinine 2.72 - 1.00 mg/dL 5.36  6.44  0.34   Sodium 135 - 145 mmol/L 138  139  138   Potassium 3.5 - 5.1 mmol/L 3.7  3.7  3.7    Chloride 98 - 111 mmol/L 104  101  102   CO2 22 - 32 mmol/L 26  29  29    Calcium 8.9 - 10.3 mg/dL 8.7  8.5  8.9   Total Protein 6.5 - 8.1 g/dL  6.7    Total Bilirubin 0.3 - 1.2 mg/dL  0.4    Alkaline Phos 38 - 126 U/L  57    AST 15 - 41 U/L  46    ALT 0 - 44 U/L  46       Imaging studies: No new pertinent imaging studies   Assessment/Plan: (ICD-10's: K22.92) 62 y.o. 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  - Continue TPN  - Continue IV Abx (Rocephin/Flagyl)  - Monitor abdominal examination  - Pain control prn; antiemetics prn   - Mobilize, DC Foley in a.m.  All of the above findings and recommendations were discussed with the patient, and the medical team, and all of patient's questions were answered to her expressed satisfaction.  -- [name], M.D., [facility]  10/05/2022 ;  9:38 AM

## 2022-10-06 DIAGNOSIS — Z5900 Homelessness unspecified: Secondary | ICD-10-CM | POA: Diagnosis not present

## 2022-10-06 DIAGNOSIS — E876 Hypokalemia: Secondary | ICD-10-CM | POA: Diagnosis not present

## 2022-10-06 DIAGNOSIS — K5720 Diverticulitis of large intestine with perforation and abscess without bleeding: Secondary | ICD-10-CM | POA: Diagnosis not present

## 2022-10-06 DIAGNOSIS — J439 Emphysema, unspecified: Secondary | ICD-10-CM | POA: Diagnosis not present

## 2022-10-06 LAB — CBC
HCT: 35.4 % — ABNORMAL LOW (ref 36.0–46.0)
Hemoglobin: 11.2 g/dL — ABNORMAL LOW (ref 12.0–15.0)
MCH: 27.3 pg (ref 26.0–34.0)
MCHC: 31.6 g/dL (ref 30.0–36.0)
MCV: 86.1 fL (ref 80.0–100.0)
Platelets: 265 10*3/uL (ref 150–400)
RBC: 4.11 MIL/uL (ref 3.87–5.11)
RDW: 13.7 % (ref 11.5–15.5)
WBC: 8.9 10*3/uL (ref 4.0–10.5)
nRBC: 0 % (ref 0.0–0.2)

## 2022-10-06 LAB — GLUCOSE, CAPILLARY
Glucose-Capillary: 120 mg/dL — ABNORMAL HIGH (ref 70–99)
Glucose-Capillary: 135 mg/dL — ABNORMAL HIGH (ref 70–99)
Glucose-Capillary: 131 mg/dL — ABNORMAL HIGH (ref 70–99)
Glucose-Capillary: 130 mg/dL — ABNORMAL HIGH (ref 70–99)

## 2022-10-06 MED ORDER — TRAVASOL 10 % IV SOLN
INTRAVENOUS | Status: AC
  Filled 2022-10-06: qty 873.6

## 2022-10-06 NOTE — Progress Notes (Signed)
Mobility Specialist - Progress Note  Post-mobility:SPO2(92)     10/06/22 1640  Mobility  Activity Ambulated independently in room;Ambulated with assistance in hallway  Level of Assistance Standby assist, set-up cues, supervision of patient - no hands on  Assistive Device Four wheel walker  Distance Ambulated (ft) 7 ft  Range of Motion/Exercises Active  Activity Response Tolerated well  Mobility Referral Yes  $Mobility charge 1 Mobility  Mobility Specialist Start Time (ACUTE ONLY) 1622  Mobility Specialist Stop Time (ACUTE ONLY) 1640  Mobility Specialist Time Calculation (min) (ACUTE ONLY) 18 min   Pt resting in bed on RA upon entry. Pt removed O2 (RN Notified) (SpO2:92) Pt STS and ambulates to door SBA with RW. Pt had 7/10 and cried out throughout the session. Pt returned to bed and left with needs in reach. Pt was motivated despite pain.   Johnathan Hausen Mobility Specialist 10/07/22, 9:09 AM

## 2022-10-06 NOTE — Progress Notes (Signed)
Progress Note   Patient: Stacy Gilmore YQI:[id number] DOB: 08-17-1960 DOA: 09/28/2022     8 DOS: the patient was seen and examined on 10/06/2022   Brief hospital course: Stacy Gilmore is a 62 y.o. female with medical history significant of recurrent diverticulitis, asthma/COPD, GERD, presented with worsening of LLQ pain. Patient has a history of recurrent diverticulitis, first and second episode in January and May this year.  About 5 days ago patient started to have similar LLQ pain, episodic, subsided on its own.  Patient did not feel the pain significantly affected her daily life and did not come to seek medical advice.  This morning patient woke up with 10/10 sharp like pain in the lower left lower quadrant associated with 2 loose bowel movement and nausea but no vomiting she also had chills but no fever. She is admitted for further management, seen by surgery team advised conservative management. Pain control, diet advancement gradually. Her pain, nausea did not improve. Repeat CT abdomen though reassuring, she does not have clinical improvement. Surgery team recommended open sigmoid colectomy and possible small bowel resection planned for 10/04/22.  Assessment and Plan: Acute complicated diverticulitis with bowel perforation abscess formation and colon/intestine fistula formation Acute peritonitis secondary to above. S/p Partial colectomy,takedown of the splenic flexure, appendectomy 10/04/22. Continue TPN. Continue IV pain meds, IV antiemetics. Clears as advised by surgery team. Continue ceftriaxone and Flagyl therapy.  Hypokalemia Resolved post supplements.  Leukopenia - WBC 3 stable, monitor daily CBC.   COPD No exacerbation. She is on room air. Continue bronchodilators PRN.   GERD Stable, continue PPI  Homeless TOC for discharge needs.    Subjective: Patient is seen and examined today after surgery. She is lying comfortably.  Reports pain and nausea. Unable to  tolerate clears. Passing gas.   Physical Exam: Vitals:   10/05/22 1952 10/06/22 0439 10/06/22 0500 10/06/22 0844  BP: 118/75 120/65  131/68  Pulse: (!) 58 65  67  Resp: 17 17  18   Temp: 97.7 F (36.5 C) 98 F (36.7 C)  97.7 F (36.5 C)  TempSrc:    Oral  SpO2: 97% 97%  94%  Weight:   67.9 kg   Height:       General - Middle aged Caucasian female, no apparent distress HEENT - PERRLA, EOMI, atraumatic head, non tender sinuses. Lung - Clear, rales, rhonchi, wheezes. Heart - S1, S2 heard, no murmurs, rubs, trace pedal edema. Abdomen - soft, midline sutures noted, no bleeding or discharge Neuro - Alert, awake and oriented x 3, non focal exam. Skin - Warm and dry. Data Reviewed:     Latest Ref Rng & Units 10/06/2022    5:10 AM 10/05/2022    8:23 AM 10/04/2022    6:19 AM  CBC  WBC 4.0 - 10.5 K/uL 8.9  10.9  3.0   Hemoglobin 12.0 - 15.0 g/dL 38.7  56.4  33.2   Hematocrit 36.0 - 46.0 % 35.4  35.8  36.9   Platelets 150 - 400 K/uL 265  267  228       Latest Ref Rng & Units 10/05/2022    5:12 AM 10/04/2022    6:19 AM 10/03/2022    4:25 AM  BMP  Glucose 70 - 99 mg/dL 951  884  166   BUN 8 - 23 mg/dL 8  6  <5   Creatinine 0.63 - 1.00 mg/dL 0.16  0.10  9.32   Sodium 135 - 145 mmol/L 138  139  138   Potassium 3.5 - 5.1 mmol/L 3.7  3.7  3.7   Chloride 98 - 111 mmol/L 104  101  102   CO2 22 - 32 mmol/L 26  29  29    Calcium 8.9 - 10.3 mg/dL 8.7  8.5  8.9    No results found.   Family Communication: significant other at bedside, understands and agrees with current care plan.  Disposition: Status is: Inpatient Remains inpatient appropriate because: Status post colectomy, she is on TPN, IV pain medications  Planned Discharge Destination: Barriers to discharge: homeless    Time spent: 39 minutes  Author: Marcelino Duster, MD 10/06/2022 4:58 PM  For on call review [email or web address].

## 2022-10-06 NOTE — Progress Notes (Signed)
[facility] SURGICAL PROGRESS NOTE (cpt [phone])  Hospital Day(s): 8.   Interval History:  Patient seen and examined No acute events or new complaints overnight.  Patient reports she continues to have incisional pain.  Became nauseated after exam nausea; small-volume emesis, witnessed. No fever, chills.  Reports flatus, denies stool.  Vital signs in last 24 hours: [min-max] current  Temp:  [97.7 F (36.5 C)-98 F (36.7 C)] 97.7 F (36.5 C) (09/22 0844) Pulse Rate:  [58-67] 67 (09/22 0844) Resp:  [16-18] 18 (09/22 0844) BP: (102-131)/(62-75) 131/68 (09/22 0844) SpO2:  [94 %-97 %] 94 % (09/22 0844) Weight:  [67.9 kg] 67.9 kg (09/22 0500)     Height: 5' 6.5" (168.9 cm) Weight: 67.9 kg BMI (Calculated): 23.8   Intake/Output last 2 shifts:  09/21 0701 - 09/22 0700 In: 499 [P.O.:240; I.V.:259] Out: 2200 [Urine:2200]   Physical Exam:  Constitutional: alert, cooperative and no distress  HENT: normocephalic without obvious abnormality  Eyes: PERRL, EOM's grossly intact and symmetric  Respiratory: breathing non-labored at rest  Cardiovascular: regular rate and sinus rhythm  Gastrointestinal: soft, she has dry blood staining of the inferior portion of her honeycomb dressing, no evidence of active bleeding or continued drainage, she is non-distended, no rebound/guarding.  Musculoskeletal: no edema or wounds, motor and sensation grossly intact, NT    Labs:     Latest Ref Rng & Units 10/06/2022    5:10 AM 10/05/2022    8:23 AM 10/04/2022    6:19 AM  CBC  WBC 4.0 - 10.5 K/uL 8.9  10.9  3.0   Hemoglobin 12.0 - 15.0 g/dL 46.9  62.9  52.8   Hematocrit 36.0 - 46.0 % 35.4  35.8  36.9   Platelets 150 - 400 K/uL 265  267  228       Latest Ref Rng & Units 10/05/2022    5:12 AM 10/04/2022    6:19 AM 10/03/2022    4:25 AM  CMP  Glucose 70 - 99 mg/dL 413  244  010   BUN 8 - 23 mg/dL 8  6  <5   Creatinine 2.72 - 1.00 mg/dL 5.36  6.44  0.34   Sodium 135 - 145 mmol/L 138  139   138   Potassium 3.5 - 5.1 mmol/L 3.7  3.7  3.7   Chloride 98 - 111 mmol/L 104  101  102   CO2 22 - 32 mmol/L 26  29  29    Calcium 8.9 - 10.3 mg/dL 8.7  8.5  8.9   Total Protein 6.5 - 8.1 g/dL  6.7    Total Bilirubin 0.3 - 1.2 mg/dL  0.4    Alkaline Phos 38 - 126 U/L  57    AST 15 - 41 U/L  46    ALT 0 - 44 U/L  46       Imaging studies: No new pertinent imaging studies   Assessment/Plan: (ICD-10's: K21.92) 62 y.o. 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  - Continue TPN, check CMP in a.m.  -DC IV Abx (Rocephin/Flagyl)  - Monitor abdominal examination  - Pain control prn; antiemetics prn   - Mobilize, DC Foley.  All of the above findings and recommendations were discussed with the patient, and the medical team, and all of patient's questions were answered to her expressed satisfaction.  -- [name], M.D., [facility]  10/06/2022 ; 11:32 AM

## 2022-10-06 NOTE — Consult Note (Signed)
PHARMACY - TOTAL PARENTERAL NUTRITION CONSULT NOTE   Indication:  Poor PO intake with planned surgical intervention   Patient Measurements: Height: 5' 6.5" (168.9 cm) Weight: 67.9 kg (149 lb 11.1 oz) IBW/kg (Calculated) : 60.45 TPN AdjBW (KG): 61.2 Body mass index is 23.8 kg/m.   Assessment: 62 yo female with PMH including recurrent diverticulitis, asthma/COPD, GERD.  Patient presented due to abdominal pain and was diagnosed with diverticulitis.  Currently receiving IV antibiotics with ceftriaxone and flagyl.  Plan now if for patient to go to OR on 9/20 for sigmoid colectomy.  Glucose / Insulin: No PMH of diabetes BG 120 - 122, 3 units used in last 24hr Electrolytes: WNL Renal: Scr < 1 Hepatic: LFTs mildly elevated in admission will order labs for tomorrow Intake / Output; MIVF: N/A GI Imaging: 9/18 CT abd/pel: Diverticulitis with abscess GI Surgeries / Procedures:  Sigmoid colectomy planned for 9/20  Central access: Order for PICC line placed TPN start date: 10/03/22  Nutritional Goals: Goal TPN rate is 65 mL/hr (provides 87.4 g of protein and 1685 kcals per day)  RD Assessment: Estimated Needs Total Energy Estimated Needs: 1600-1800kcal/day Total Protein Estimated Needs: 80-90g/day Total Fluid Estimated Needs: 1.7-2.0L/day  Current Nutrition:  Clear liquids  Plan:  ---Continue TPN at goal rate of 65 mL/hr at 1800 ---Electrolytes in TPN (standard): Na 48mEq/L, K 74mEq/L, Ca 32mEq/L, Mg 36mEq/L, and Phos 66mmol/L. Cl:Ac 1:1 ---Add standard MVI and trace elements to TPN ---continue Sensitive q8h SSI and adjust as needed  ---Monitor TPN labs on Mon/Thurs, and daily until labs are stable  Bettey Costa, PharmD 10/06/2022,11:13 AM

## 2022-10-07 DIAGNOSIS — K5720 Diverticulitis of large intestine with perforation and abscess without bleeding: Secondary | ICD-10-CM | POA: Diagnosis not present

## 2022-10-07 DIAGNOSIS — Z5900 Homelessness unspecified: Secondary | ICD-10-CM | POA: Diagnosis not present

## 2022-10-07 DIAGNOSIS — E876 Hypokalemia: Secondary | ICD-10-CM | POA: Diagnosis not present

## 2022-10-07 DIAGNOSIS — J439 Emphysema, unspecified: Secondary | ICD-10-CM | POA: Diagnosis not present

## 2022-10-07 LAB — COMPREHENSIVE METABOLIC PANEL
ALT: 42 U/L (ref 0–44)
AST: 51 U/L — ABNORMAL HIGH (ref 15–41)
Albumin: 3.1 g/dL — ABNORMAL LOW (ref 3.5–5.0)
Alkaline Phosphatase: 60 U/L (ref 38–126)
Anion gap: 8 (ref 5–15)
BUN: 11 mg/dL (ref 8–23)
CO2: 28 mmol/L (ref 22–32)
Calcium: 8.8 mg/dL — ABNORMAL LOW (ref 8.9–10.3)
Chloride: 101 mmol/L (ref 98–111)
Creatinine, Ser: 0.42 mg/dL — ABNORMAL LOW (ref 0.44–1.00)
GFR, Estimated: >60 mL/min (ref >60)
Glucose, Bld: 121 mg/dL — ABNORMAL HIGH (ref 70–99)
Potassium: 4.2 mmol/L (ref 3.5–5.1)
Sodium: 137 mmol/L (ref 135–145)
Total Bilirubin: 0.5 mg/dL (ref 0.3–1.2)
Total Protein: 6.5 g/dL (ref 6.5–8.1)

## 2022-10-07 LAB — CBC
HCT: 36.2 % (ref 36.0–46.0)
Hemoglobin: 11.9 g/dL — ABNORMAL LOW (ref 12.0–15.0)
MCH: 27.7 pg (ref 26.0–34.0)
MCHC: 32.9 g/dL (ref 30.0–36.0)
MCV: 84.4 fL (ref 80.0–100.0)
Platelets: 292 10*3/uL (ref 150–400)
RBC: 4.29 MIL/uL (ref 3.87–5.11)
RDW: 13.7 % (ref 11.5–15.5)
WBC: 8.5 10*3/uL (ref 4.0–10.5)
nRBC: 0 % (ref 0.0–0.2)

## 2022-10-07 LAB — GLUCOSE, CAPILLARY
Glucose-Capillary: 139 mg/dL — ABNORMAL HIGH (ref 70–99)
Glucose-Capillary: 141 mg/dL — ABNORMAL HIGH (ref 70–99)

## 2022-10-07 LAB — PHOSPHORUS: Phosphorus: 3.9 mg/dL (ref 2.5–4.6)

## 2022-10-07 LAB — TRIGLYCERIDES: Triglycerides: 142 mg/dL (ref <150)

## 2022-10-07 LAB — MAGNESIUM: Magnesium: 2.1 mg/dL (ref 1.7–2.4)

## 2022-10-07 MED ORDER — TRAVASOL 10 % IV SOLN
INTRAVENOUS | Status: AC
  Filled 2022-10-07: qty 873.6

## 2022-10-07 MED ORDER — ORAL CARE MOUTH RINSE: 15.0000 mL | OROMUCOSAL | Status: DC | PRN

## 2022-10-07 NOTE — Consult Note (Signed)
PHARMACY - TOTAL PARENTERAL NUTRITION CONSULT NOTE   Indication:  Poor PO intake with planned surgical intervention   Patient Measurements: Height: 5' 6.5" (168.9 cm) Weight: 70.2 kg (154 lb 12.2 oz) IBW/kg (Calculated) : 60.45 TPN AdjBW (KG): 61.2 Body mass index is 24.61 kg/m.   Assessment: 62 yo female with PMH including recurrent diverticulitis, asthma/COPD, GERD.  Patient presented due to abdominal pain and was diagnosed with diverticulitis.  Currently receiving IV antibiotics with ceftriaxone and flagyl.  Plan now if for patient to go to OR on 9/20 for sigmoid colectomy.  Glucose / Insulin: No PMH of diabetes BG 120 - 130, 2 units used in last 24hr Electrolytes: WNL Renal: Scr < 1 Hepatic: LFTs mildly elevated in admission will order labs for tomorrow Intake / Output; MIVF: N/A GI Imaging: 9/18 CT abd/pel: Diverticulitis with abscess GI Surgeries / Procedures:  Sigmoid colectomy planned for 9/20  Central access: PICC line 10/03/22 TPN start date: 10/03/22  Nutritional Goals: Goal TPN rate is 65 mL/hr (provides 87.4 g of protein and 1685 kcals per day)  RD Assessment: Estimated Needs Total Energy Estimated Needs: 1600-1800kcal/day Total Protein Estimated Needs: 80-90g/day Total Fluid Estimated Needs: 1.7-2.0L/day  Current Nutrition:  Clear liquids  Plan:  ---Continue TPN at goal rate of 65 mL/hr at 1800 ---Electrolytes in TPN (standard): Na 63mEq/L, K 50mEq/L, Ca 58mEq/L, Mg 4mEq/L, and Phos 69mmol/L. Cl:Ac 1:1 ---Add standard MVI and trace elements to TPN ---stop SSI ---Monitor TPN labs on Mon/Thurs, and daily until labs are stable  Lowella Bandy, PharmD 10/07/2022,7:04 AM

## 2022-10-07 NOTE — Progress Notes (Signed)
Progress Note   Patient: Stacy Gilmore UUV:[id number] DOB: 1960-03-19 DOA: 09/28/2022     9 DOS: the patient was seen and examined on 10/07/2022   Brief hospital course: Stacy Gilmore is a 62 y.o. female with medical history significant of recurrent diverticulitis, asthma/COPD, GERD, presented with worsening of LLQ pain. Patient has a history of recurrent diverticulitis, first and second episode in January and May this year.  About 5 days ago patient started to have similar LLQ pain, episodic, subsided on its own.  Patient did not feel the pain significantly affected her daily life and did not come to seek medical advice.  This morning patient woke up with 10/10 sharp like pain in the lower left lower quadrant associated with 2 loose bowel movement and nausea but no vomiting she also had chills but no fever. She is admitted for further management, seen by surgery team advised conservative management. Pain control, diet advancement gradually. Her pain, nausea did not improve. Repeat CT abdomen though reassuring, she does not have clinical improvement. Surgery team recommended open sigmoid colectomy and possible small bowel resection planned for 10/04/22.  Assessment and Plan: Acute complicated diverticulitis with bowel perforation abscess formation and colon/intestine fistula formation Acute peritonitis secondary to above. S/p Partial colectomy,takedown of the splenic flexure, appendectomy 10/04/22. Continue TPN. She is not able to tolerate clears. Continue IV pain meds, IV antiemetics. Continue ceftriaxone and Flagyl therapy.  Hypokalemia Resolved post supplements.  Leukopenia - WBC 3 stable, monitor daily CBC.   COPD No exacerbation. She is on room air. Continue bronchodilators PRN.   GERD Stable, continue PPI  Homeless TOC for discharge needs.    Subjective: Patient is seen and examined today after surgery. She is lying comfortably. OT at bedside working with her. Reports  pain and nausea persists. Unable to tolerate clears. Passing gas.   Physical Exam: Vitals:   10/06/22 1931 10/07/22 0500 10/07/22 0519 10/07/22 0833  BP: 132/85  (!) 132/97 139/71  Pulse: 70  72 72  Resp: 20  18 18   Temp: 97.8 F (36.6 C)  97.8 F (36.6 C)   TempSrc: Oral  Oral   SpO2: 93%  92% 99%  Weight:  70.2 kg    Height:       General - Middle aged Caucasian female, in distress due to pain, nausea HEENT - PERRLA, EOMI, atraumatic head, non tender sinuses. Lung - Clear, rales, rhonchi, wheezes. Heart - S1, S2 heard, no murmurs, rubs, trace pedal edema. Abdomen - soft, midline sutures noted, no bleeding or discharge Neuro - Alert, awake and oriented x 3, non focal exam. Skin - Warm and dry. Data Reviewed:     Latest Ref Rng & Units 10/07/2022    5:27 AM 10/06/2022    5:10 AM 10/05/2022    8:23 AM  CBC  WBC 4.0 - 10.5 K/uL 8.5  8.9  10.9   Hemoglobin 12.0 - 15.0 g/dL 47.4  25.9  56.3   Hematocrit 36.0 - 46.0 % 36.2  35.4  35.8   Platelets 150 - 400 K/uL 292  265  267       Latest Ref Rng & Units 10/07/2022    5:27 AM 10/05/2022    5:12 AM 10/04/2022    6:19 AM  BMP  Glucose 70 - 99 mg/dL 875  643  329   BUN 8 - 23 mg/dL 11  8  6    Creatinine 0.44 - 1.00 mg/dL 5.18  8.41  6.60   Sodium  135 - 145 mmol/L 137  138  139   Potassium 3.5 - 5.1 mmol/L 4.2  3.7  3.7   Chloride 98 - 111 mmol/L 101  104  101   CO2 22 - 32 mmol/L 28  26  29    Calcium 8.9 - 10.3 mg/dL 8.8  8.7  8.5    No results found.   Family Communication: significant other at bedside, understands and agrees with current care plan.  Disposition: Status is: Inpatient Remains inpatient appropriate because: Status post colectomy, she is on TPN, IV pain medications, clears  Planned Discharge Destination: Barriers to discharge: homeless    Time spent: 42 minutes  Author: Marcelino Duster, MD 10/07/2022 4:30 PM  For on call review [email or web address].

## 2022-10-07 NOTE — TOC Progression Note (Addendum)
Transition of Care [facility]) - Progression Note    Patient Details  Name: Stacy Gilmore MRN: [id number] Date of Birth: January 19, 1960  [facility] CM/SW Contact  Darolyn Rua, [place] Phone Number: 10/07/2022, 3:05 PM  Clinical Narrative:      9/23: PT reports no PT follow up needed. Patient continues TPN, 3 days post op colectomy and incidental appendectomy    9/15: TOC consult for SA resources, CSW met with patient at bedside, introduced role. Patient at first declined SA resources but was agreeable for CSW to leave packet. Reports no dc needs, has PCP Jacquenette Shone and goes to [employer] she reports at [facility].   Expected Discharge Plan: Home/Self Care Barriers to Discharge: Continued Medical Work up  Expected Discharge Plan and Services       Living arrangements for the past 2 months: Single Family Home                                       Social Determinants of Health (SDOH) Interventions SDOH Screenings   Food Insecurity: Food Insecurity Present (09/28/2022)  Housing: High Risk (09/28/2022)  Transportation Needs: Unmet Transportation Needs (09/28/2022)  Utilities: At Risk (09/28/2022)  Alcohol Screen: Low Risk  (03/19/2022)  Depression (PHQ2-9): High Risk (03/19/2022)  Financial Resource Strain: High Risk (03/19/2022)  Physical Activity: Inactive (03/19/2022)  Social Connections: Socially Isolated (03/19/2022)  Stress: Stress Concern Present (03/19/2022)  Tobacco Use: High Risk (10/04/2022)    Readmission Risk Interventions     No data to display

## 2022-10-07 NOTE — Evaluation (Signed)
Physical Therapy Evaluation Patient Details Name: Stacy Gilmore MRN: [id number] DOB: [birth year] Today's Date: 10/07/2022  History of Present Illness  62 y/o female presented to ED on 09/28/22 for intermittent LLQ abdominal pain since January with worsening pain in past 2 days. CT abdomen showed complicated diverticulitis. S/p partial colectomy and incidental appendectomy on 9/20. PMH: asthma/COPD, diverticulitis, GERD  Clinical Impression  Patient admitted with the above. PTA, patient lives with her boyfriend and reports independence. Patient limited by pain in her abdomen at this time. Ambulatory 86' with RW and CGA for safety due to severely flexed trunk during mobility. Encouraged continued mobility while hospitalized. Patient will benefit from skilled PT services during acute stay to address listed deficits.    If plan is discharge home, recommend the following: Help with stairs or ramp for entrance;Assist for transportation;Assistance with cooking/housework   Can travel by private vehicle        Equipment Recommendations Rolling Breanah Faddis (2 wheels)  Recommendations for Other Services       Functional Status Assessment Patient has had a recent decline in their functional status and demonstrates the ability to make significant improvements in function in a reasonable and predictable amount of time.     Precautions / Restrictions Precautions Precautions: Fall Restrictions Weight Bearing Restrictions: No      Mobility  Bed Mobility Overal bed mobility: Modified Independent                  Transfers Overall transfer level: Modified independent Equipment used: Rolling Helayna Dun (2 wheels)                    Ambulation/Gait Ambulation/Gait assistance: Contact guard assist Gait Distance (Feet): 50 Feet Assistive device: Rolling Vangie Henthorn (2 wheels) Gait Pattern/deviations: Step-through pattern, Decreased stride length, Trunk flexed Gait velocity: decr      General Gait Details: very slow gait speed. CGA for safety due to heavy trunk flexion with mobility. Cues for upright posture with poor follow through  Stairs            Wheelchair Mobility     Tilt Bed    Modified Rankin (Stroke Patients Only)       Balance Overall balance assessment: Mild deficits observed, not formally tested                                           Pertinent Vitals/Pain Pain Assessment Pain Assessment: Faces Faces Pain Scale: Hurts even more Pain Location: abdomen Pain Descriptors / Indicators: Grimacing, Guarding Pain Intervention(s): Limited activity within patient's tolerance, Monitored during session, Patient requesting pain meds-RN notified    Home Living Family/patient expects to be discharged to:: Private residence Living Arrangements: Spouse/significant other   Type of Home: House Home Access: Level entry       Home Layout: One level        Prior Function Prior Level of Function : Independent/Modified Independent                     Extremity/Trunk Assessment   Upper Extremity Assessment Upper Extremity Assessment: Overall WFL for tasks assessed    Lower Extremity Assessment Lower Extremity Assessment: Generalized weakness    Cervical / Trunk Assessment Cervical / Trunk Assessment: Normal  Communication   Communication Communication: No apparent difficulties  Cognition Arousal: Alert Behavior During Therapy: WFL for tasks assessed/performed Overall Cognitive  Status: Within Functional Limits for tasks assessed                                          General Comments      Exercises     Assessment/Plan    PT Assessment Patient needs continued PT services  PT Problem List Decreased strength;Decreased balance;Decreased activity tolerance;Decreased mobility;Pain       PT Treatment Interventions DME instruction;Gait training;Functional mobility training;Therapeutic  activities;Therapeutic exercise;Balance training;Patient/family education    PT Goals (Current goals can be found in the Care Plan section)  Acute Rehab PT Goals Patient Stated Goal: to reduce pain PT Goal Formulation: With patient Time For Goal Achievement: 10/21/22 Potential to Achieve Goals: Good    Frequency Min 1X/week     Co-evaluation               AM-PAC PT "6 Clicks" Mobility  Outcome Measure Help needed turning from your back to your side while in a flat bed without using bedrails?: None Help needed moving from lying on your back to sitting on the side of a flat bed without using bedrails?: None Help needed moving to and from a bed to a chair (including a wheelchair)?: None Help needed standing up from a chair using your arms (e.g., wheelchair or bedside chair)?: None Help needed to walk in hospital room?: A Little Help needed climbing 3-5 steps with a railing? : A Lot 6 Click Score: 21    End of Session   Activity Tolerance: Patient limited by pain Patient left: in bed;with call bell/phone within reach Nurse Communication: Mobility status PT Visit Diagnosis: Muscle weakness (generalized) (M62.81);Pain Pain - part of body:  (abdomen)    Time: 1610-9604 PT Time Calculation (min) (ACUTE ONLY): 11 min   Charges:   PT Evaluation $PT Eval Low Complexity: 1 Low   PT General Charges $$ ACUTE PT VISIT: 1 Visit         Maylon Peppers, PT, DPT Physical Therapist - [facility]  [facility]   September Mormile A Josie Burleigh 10/07/2022, 12:37 PM

## 2022-10-07 NOTE — Progress Notes (Signed)
[facility] SURGICAL PROGRESS NOTE  Hospital Day(s): 9.   Post op day(s): 3 Days Post-Op.   Interval History:  Patient seen and examined No acute events or new complaints overnight.  Patient reports incisional soreness Nausea this AM; no emesis No fever, chills  Without WBC 8.5K Hgb to 11.9 Renal function normal; sCr - 0.42; UO - 450 ccs + unmeasured No electrolyte derangements CLD + TPN  Vital signs in last 24 hours: [min-max] current  Temp:  [97.7 F (36.5 C)-97.8 F (36.6 C)] 97.8 F (36.6 C) (09/23 0519) Pulse Rate:  [67-77] 72 (09/23 0519) Resp:  [18-20] 18 (09/23 0519) BP: (131-133)/(68-97) 132/97 (09/23 0519) SpO2:  [91 %-94 %] 92 % (09/23 0519) Weight:  [70.2 kg] 70.2 kg (09/23 0500)     Height: 5' 6.5" (168.9 cm) Weight: 70.2 kg BMI (Calculated): 24.61   Intake/Output last 2 shifts:  09/22 0701 - 09/23 0700 In: 2885.4 [I.V.:1931.9; IV Piggyback:953.5] Out: 450 [Urine:450]   Physical Exam:  Constitutional: alert, cooperative and no distress  Respiratory: breathing non-labored at rest  Cardiovascular: regular rate and sinus rhythm  Gastrointestinal: soft, incisional soreness, and non-distended, no rebound/guarding Integumentary: Laparotomy is CDI with staples, honeycomb removed, no erythema   Labs:     Latest Ref Rng & Units 10/07/2022    5:27 AM 10/06/2022    5:10 AM 10/05/2022    8:23 AM  CBC  WBC 4.0 - 10.5 K/uL 8.5  8.9  10.9   Hemoglobin 12.0 - 15.0 g/dL 16.1  09.6  04.5   Hematocrit 36.0 - 46.0 % 36.2  35.4  35.8   Platelets 150 - 400 K/uL 292  265  267       Latest Ref Rng & Units 10/07/2022    5:27 AM 10/05/2022    5:12 AM 10/04/2022    6:19 AM  CMP  Glucose 70 - 99 mg/dL 409  811  914   BUN 8 - 23 mg/dL 11  8  6    Creatinine 0.44 - 1.00 mg/dL 7.82  9.56  2.13   Sodium 135 - 145 mmol/L 137  138  139   Potassium 3.5 - 5.1 mmol/L 4.2  3.7  3.7   Chloride 98 - 111 mmol/L 101  104  101   CO2 22 - 32 mmol/L 28  26  29    Calcium  8.9 - 10.3 mg/dL 8.8  8.7  8.5   Total Protein 6.5 - 8.1 g/dL 6.5   6.7   Total Bilirubin 0.3 - 1.2 mg/dL 0.5   0.4   Alkaline Phos 38 - 126 U/L 60   57   AST 15 - 41 U/L 51   46   ALT 0 - 44 U/L 42   46      Imaging studies: No new pertinent imaging studies   Assessment/Plan:  62 y.o. female 3 Days Post-Op s/p sigmoid colectomy with Baker anastomosis, incidental appendectomy for recurrent complicated diverticulitis    - Advance diet as feasible; CLD this AM. Okay to do FLD this afternoon if nausea improved - Continue TPN to goal for now; monitor electrolytes   - Monitor abdominal examination; on-going bowel function   - Pain control prn; antiemetics prn   - Mobilize; get PT - Further management per primary service; we will follow    All of the above findings and recommendations were discussed with the patient, and the medical team, and all of patient's questions were answered to her expressed satisfaction.  --  Lynden Oxford, PA-C [facility] 10/07/2022, 7:42 AM M-F: 7am - 4pm

## 2022-10-08 DIAGNOSIS — E876 Hypokalemia: Secondary | ICD-10-CM | POA: Diagnosis not present

## 2022-10-08 DIAGNOSIS — Z5900 Homelessness unspecified: Secondary | ICD-10-CM | POA: Diagnosis not present

## 2022-10-08 DIAGNOSIS — K5720 Diverticulitis of large intestine with perforation and abscess without bleeding: Secondary | ICD-10-CM | POA: Diagnosis not present

## 2022-10-08 DIAGNOSIS — J439 Emphysema, unspecified: Secondary | ICD-10-CM | POA: Diagnosis not present

## 2022-10-08 LAB — CBC
HCT: 36.1 % (ref 36.0–46.0)
Hemoglobin: 11.4 g/dL — ABNORMAL LOW (ref 12.0–15.0)
MCH: 27.1 pg (ref 26.0–34.0)
MCHC: 31.6 g/dL (ref 30.0–36.0)
MCV: 85.7 fL (ref 80.0–100.0)
Platelets: 298 10*3/uL (ref 150–400)
RBC: 4.21 MIL/uL (ref 3.87–5.11)
RDW: 13.8 % (ref 11.5–15.5)
WBC: 7.3 10*3/uL (ref 4.0–10.5)
nRBC: 0 % (ref 0.0–0.2)

## 2022-10-08 MED ORDER — TRAVASOL 10 % IV SOLN
INTRAVENOUS | Status: AC
  Filled 2022-10-08: qty 396

## 2022-10-08 MED ORDER — ENSURE ENLIVE PO LIQD
237.0000 mL | Freq: Three times a day (TID) | ORAL | Status: DC
  Administered 2022-10-09 – 2022-10-10 (×4): 237 mL via ORAL

## 2022-10-08 NOTE — Progress Notes (Signed)
Nutrition Follow-up  DOCUMENTATION CODES:   Not applicable  INTERVENTION:   Continue TPN per pharmacy- plan is for half rate tonight   Continue thiamine, folic acid and MVI daily   Ensure Enlive po TID, each supplement provides 350 kcal and 20 grams of protein.  NUTRITION DIAGNOSIS:   Inadequate oral intake related to acute illness as evidenced by other (comment) (pt on clear liquid diet). -ongoing   GOAL:   Patient will meet greater than or equal to 90% of their needs -met   MONITOR:   PO intake, Supplement acceptance, Diet advancement, Labs, Weight trends, I & O's, Skin, TPN  ASSESSMENT:   62 y/o female with h/o asthma, GERD, substance abuse, COPD, CAD and HTN who is admitted with acute diverticulitis with bowel perforation & abscess s/p partial colectomy with primary  stapled anastomosis, takedown of the splenic flexure and incidental appendectomy 9/20.  Pt tolerating TPN at goal rate. Plan is to begin weaning tonight. Pt initiated on a clear liquid diet 9/22. Pt with poor oral intake in hospital. Pt advanced to full liquids today. RD will add Ensure supplements to help pt meet her estimated needs. Would recommend continue TPN until pt's oral intake improves. Pt is passing flatus and small BMs. Per chart, pt is up ~21lbs since admission but appears up only ~8lbs from her UBW.   Medications reviewed and include: lovenox, folic acid, MVI, nicotine, protonix, thiamine, TPN  Labs reviewed: K 4.2 wnl, creat 0.42(L), P 3.9 wnl, Mg 2.1 wnl- 9/23 Triglycerides- 142- 9/23 Cbgs- 141, 139 x 48 hrs   Diet Order:   Diet Order             Diet full liquid Room service appropriate? Yes; Fluid consistency: Thin  Diet effective now                  EDUCATION NEEDS:   No education needs have been identified at this time  Skin:  Skin Assessment: Reviewed RN Assessment  Last BM:  9/24  Height:   Ht Readings from Last 1 Encounters:  09/28/22 5' 6.5" (1.689 m)     Weight:   Wt Readings from Last 1 Encounters:  10/08/22 70.9 kg    Ideal Body Weight:  60.2 kg  BMI:  Body mass index is 24.85 kg/m.  Estimated Nutritional Needs:   Kcal:  1600-1800kcal/day  Protein:  80-90g/day  Fluid:  1.7-2.0L/day  Betsey Holiday MS, RD, LDN Please refer to [facility] for RD and/or RD on-call/weekend/after hours pager

## 2022-10-08 NOTE — Plan of Care (Signed)

## 2022-10-08 NOTE — Progress Notes (Signed)
Mobility Specialist - Progress Note   10/08/22 1009  Mobility  Activity Ambulated with assistance in hallway  Level of Assistance Contact guard assist, steadying assist  Assistive Device Front wheel walker  Distance Ambulated (ft) 60 ft  Activity Response Tolerated well  $Mobility charge 1 Mobility  Mobility Specialist Start Time (ACUTE ONLY) [id number]  Mobility Specialist Stop Time (ACUTE ONLY) 1005  Mobility Specialist Time Calculation (min) (ACUTE ONLY) 9 min   Pt supine upon entry, utilizing RA. Pt expressed R side stomach pain, agreeable to OOB amb this date. Pt completed bed mob via log roll and STS to RW ModI. Pt amb 60 ft in the hallway CGA-MinG, stopping once for a standing rest break due to pain and fatigue. Pt returned to the room, left supine with needs within reach. RN notified.  Zetta Bills Mobility Specialist 10/08/22 10:16 AM

## 2022-10-08 NOTE — Consult Note (Signed)
PHARMACY - TOTAL PARENTERAL NUTRITION CONSULT NOTE   Indication:  Poor PO intake with planned surgical intervention   Patient Measurements: Height: 5' 6.5" (168.9 cm) Weight: 70.9 kg (156 lb 4.9 oz) IBW/kg (Calculated) : 60.45 TPN AdjBW (KG): 61.2 Body mass index is 24.85 kg/m.   Assessment: 62 yo female with PMH including recurrent diverticulitis, asthma/COPD, GERD.  Patient presented due to abdominal pain and was diagnosed with diverticulitis.  Currently receiving IV antibiotics with ceftriaxone and flagyl.  Plan now if for patient to go to OR on 9/20 for sigmoid colectomy.  Glucose / Insulin: No PMH of diabetes BG 139 - 141, 0 units used in last 24hr Electrolytes: WNL Renal: Scr < 1 Hepatic: LFTs mildly elevated in admission will order labs for tomorrow Intake / Output; MIVF: N/A GI Imaging: 9/18 CT abd/pel: Diverticulitis with abscess GI Surgeries / Procedures:  Sigmoid colectomy planned for 9/20  Central access: PICC line 10/03/22 TPN start date: 10/03/22  Nutritional Goals: Goal TPN rate is 65 mL/hr (provides 87.4 g of protein and 1685 kcals per day)  RD Assessment: Estimated Needs Total Energy Estimated Needs: 1600-1800kcal/day Total Protein Estimated Needs: 80-90g/day Total Fluid Estimated Needs: 1.7-2.0L/day  Current Nutrition:  Clear liquids  Plan:  ---reduce TPN to 30 mL/hr at 1800 ---Electrolytes in TPN (standard): Na 58mEq/L, K 50mEq/L, Ca 41mEq/L, Mg 10mEq/L, and Phos 66mmol/L. Cl:Ac 1:1 ---Add standard MVI and trace elements to TPN ---Monitor TPN labs on Mon/Thurs, and daily until labs are stable  Lowella Bandy, PharmD 10/08/2022,7:13 AM

## 2022-10-08 NOTE — Progress Notes (Signed)
[facility] SURGICAL PROGRESS NOTE  Hospital Day(s): 10.   Post op day(s): 4 Days Post-Op.   Interval History:  Patient seen and examined No acute events or new complaints overnight.  Patient reports continued abdominal soreness but improving No longer with nausea/emesis No fever, chills  No new labs; CBC pending  Up with PT yesterday CLD + TPN Having flatus and small BM  Vital signs in last 24 hours: [min-max] current  Temp:  [97.7 F (36.5 C)-98 F (36.7 C)] 98 F (36.7 C) (09/24 0347) Pulse Rate:  [67-74] 67 (09/24 0347) Resp:  [18] 18 (09/24 0347) BP: (109-139)/(71-87) 123/76 (09/24 0347) SpO2:  [93 %-99 %] 95 % (09/24 0347) Weight:  [70.9 kg] 70.9 kg (09/24 0347)     Height: 5' 6.5" (168.9 cm) Weight: 70.9 kg BMI (Calculated): 24.85   Intake/Output last 2 shifts:  09/23 0701 - 09/24 0700 In: 627.3 [I.V.:627.3] Out: -    Physical Exam:  Constitutional: alert, cooperative and no distress  Respiratory: breathing non-labored at rest  Cardiovascular: regular rate and sinus rhythm  Gastrointestinal: soft, incisional soreness, and non-distended, no rebound/guarding Integumentary: Laparotomy is CDI with staples, no erythema   Labs:     Latest Ref Rng & Units 10/07/2022    5:27 AM 10/06/2022    5:10 AM 10/05/2022    8:23 AM  CBC  WBC 4.0 - 10.5 K/uL 8.5  8.9  10.9   Hemoglobin 12.0 - 15.0 g/dL 52.8  41.3  24.4   Hematocrit 36.0 - 46.0 % 36.2  35.4  35.8   Platelets 150 - 400 K/uL 292  265  267       Latest Ref Rng & Units 10/07/2022    5:27 AM 10/05/2022    5:12 AM 10/04/2022    6:19 AM  CMP  Glucose 70 - 99 mg/dL 010  272  536   BUN 8 - 23 mg/dL 11  8  6    Creatinine 0.44 - 1.00 mg/dL 6.44  0.34  7.42   Sodium 135 - 145 mmol/L 137  138  139   Potassium 3.5 - 5.1 mmol/L 4.2  3.7  3.7   Chloride 98 - 111 mmol/L 101  104  101   CO2 22 - 32 mmol/L 28  26  29    Calcium 8.9 - 10.3 mg/dL 8.8  8.7  8.5   Total Protein 6.5 - 8.1 g/dL 6.5   6.7    Total Bilirubin 0.3 - 1.2 mg/dL 0.5   0.4   Alkaline Phos 38 - 126 U/L 60   57   AST 15 - 41 U/L 51   46   ALT 0 - 44 U/L 42   46      Imaging studies: No new pertinent imaging studies   Assessment/Plan:  62 y.o. female 4 Days Post-Op s/p sigmoid colectomy with Baker anastomosis, incidental appendectomy for recurrent complicated diverticulitis    - Advance diet as feasible; FLD this morning. Okay for soft diet this PM if doing well  - Wean TPN to 1/2 rate; monitor electrolytes   - Monitor abdominal examination; on-going bowel function   - Pain control prn; antiemetics prn   - Mobilize; PT following  - Further management per primary service; we will follow    - Discharge Planning: Doing well, making improvements. Diet advancing and weaning from TPN. Anticipate ready fro home in next 48 hours.   All of the above findings and recommendations were discussed with the patient,  and the medical team, and all of patient's questions were answered to her expressed satisfaction.  -- Lynden Oxford, PA-C [facility] 10/08/2022, 6:53 AM M-F: 7am - 4pm

## 2022-10-08 NOTE — Progress Notes (Signed)
Progress Note   Patient: Stacy Gilmore NWG:[id number] DOB: 10-18-1960 DOA: 09/28/2022     10 DOS: the patient was seen and examined on 10/08/2022   Brief hospital course: ANNABELLEE MESKO is a 62 y.o. female with medical history significant of recurrent diverticulitis, asthma/COPD, GERD, presented with worsening of LLQ pain. Patient has a history of recurrent diverticulitis, first and second episode in January and May this year.  About 5 days ago patient started to have similar LLQ pain, episodic, subsided on its own.  Patient did not feel the pain significantly affected her daily life and did not come to seek medical advice.  This morning patient woke up with 10/10 sharp like pain in the lower left lower quadrant associated with 2 loose bowel movement and nausea but no vomiting she also had chills but no fever. She is admitted for further management, seen by surgery team advised conservative management. Pain control, diet advancement gradually. Her pain, nausea did not improve. Repeat CT abdomen though reassuring, she does not have clinical improvement. Surgery team recommended open sigmoid colectomy and possible small bowel resection planned for 10/04/22. Started on TPN. 9/24 she is able to tolerate diet.  Assessment and Plan: Acute complicated diverticulitis with bowel perforation abscess formation and colon/intestine fistula formation,  Acute peritonitis secondary to above. S/p Partial colectomy, takedown of the splenic flexure, appendectomy 10/04/22. Taper down TPN. She is able to tolerate soft diet. Continue IV pain meds, IV antiemetics. Continue ceftriaxone and Flagyl therapy.  Hypokalemia Resolved post supplements.  Leukopenia - Improved. stable, monitor daily CBC.   COPD No exacerbation. She is on room air. Continue bronchodilators PRN.   GERD Stable, continue PPI  Homeless TOC for discharge needs.    Subjective: Patient is seen and examined today after surgery. She is  lying comfortably. Able to tolerate diet. Reports pain and nausea better. Able to tolerate clears. Passing gas. Had small bowel movement.  Physical Exam: Vitals:   10/08/22 0347 10/08/22 0835 10/08/22 1716 10/08/22 2021  BP: 123/76 129/74 101/81 107/79  Pulse: 67 80 72 74  Resp: 18 16 18 16   Temp: 98 F (36.7 C) (!) 97.5 F (36.4 C) 97.9 F (36.6 C) 97.9 F (36.6 C)  TempSrc: Oral Oral Oral   SpO2: 95% 92% 94% 95%  Weight: 70.9 kg     Height:       General - Middle aged Caucasian female, no distress HEENT - PERRLA, EOMI, atraumatic head, non tender sinuses. Lung - Clear, rales, rhonchi, wheezes. Heart - S1, S2 heard, no murmurs, rubs, trace pedal edema. Abdomen - soft, midline sutures noted, no bleeding or discharge Neuro - Alert, awake and oriented x 3, non focal exam. Skin - Warm and dry. Data Reviewed:     Latest Ref Rng & Units 10/08/2022    8:03 AM 10/07/2022    5:27 AM 10/06/2022    5:10 AM  CBC  WBC 4.0 - 10.5 K/uL 7.3  8.5  8.9   Hemoglobin 12.0 - 15.0 g/dL 57.8  46.9  62.9   Hematocrit 36.0 - 46.0 % 36.1  36.2  35.4   Platelets 150 - 400 K/uL 298  292  265       Latest Ref Rng & Units 10/07/2022    5:27 AM 10/05/2022    5:12 AM 10/04/2022    6:19 AM  BMP  Glucose 70 - 99 mg/dL 528  413  244   BUN 8 - 23 mg/dL 11  8  6  Creatinine 0.44 - 1.00 mg/dL 9.52  8.41  3.24   Sodium 135 - 145 mmol/L 137  138  139   Potassium 3.5 - 5.1 mmol/L 4.2  3.7  3.7   Chloride 98 - 111 mmol/L 101  104  101   CO2 22 - 32 mmol/L 28  26  29    Calcium 8.9 - 10.3 mg/dL 8.8  8.7  8.5    No results found.   Family Communication:  she is homeless, understands and agrees with current care plan.  Disposition: Status is: Inpatient Remains inpatient appropriate because: Status post colectomy, taper TPN, IV pain medications, diet  Planned Discharge Destination: Barriers to discharge: homeless    Time spent: 38 minutes  Author: Marcelino Duster, MD 10/08/2022 8:45 PM  For  on call review [email or web address].

## 2022-10-09 DIAGNOSIS — K5720 Diverticulitis of large intestine with perforation and abscess without bleeding: Secondary | ICD-10-CM | POA: Diagnosis not present

## 2022-10-09 LAB — CBC
HCT: 35.5 % — ABNORMAL LOW (ref 36.0–46.0)
Hemoglobin: 11.6 g/dL — ABNORMAL LOW (ref 12.0–15.0)
MCH: 27.6 pg (ref 26.0–34.0)
MCHC: 32.7 g/dL (ref 30.0–36.0)
MCV: 84.5 fL (ref 80.0–100.0)
Platelets: 303 10*3/uL (ref 150–400)
RBC: 4.2 MIL/uL (ref 3.87–5.11)
RDW: 13.8 % (ref 11.5–15.5)
WBC: 8.6 10*3/uL (ref 4.0–10.5)
nRBC: 0 % (ref 0.0–0.2)

## 2022-10-09 LAB — URINALYSIS, ROUTINE W REFLEX MICROSCOPIC
Bilirubin Urine: NEGATIVE
Glucose, UA: NEGATIVE mg/dL
Hgb urine dipstick: NEGATIVE
Ketones, ur: NEGATIVE mg/dL
Nitrite: NEGATIVE
Protein, ur: NEGATIVE mg/dL
Specific Gravity, Urine: 1.016 (ref 1.005–1.030)
pH: 5 (ref 5.0–8.0)

## 2022-10-09 MED ORDER — TRAVASOL 10 % IV SOLN
INTRAVENOUS | Status: AC
  Filled 2022-10-09: qty 396

## 2022-10-09 MED ORDER — SIMETHICONE 80 MG PO CHEW
160.0000 mg | CHEWABLE_TABLET | Freq: Once | ORAL | Status: AC
  Administered 2022-10-09: 160 mg via ORAL
  Filled 2022-10-09: qty 2

## 2022-10-09 MED ORDER — SODIUM CHLORIDE 0.9 % IV SOLN
1.0000 g | INTRAVENOUS | Status: DC
  Administered 2022-10-09 – 2022-10-10 (×2): 1 g via INTRAVENOUS
  Filled 2022-10-09 (×4): qty 10

## 2022-10-09 MED ORDER — CYCLOBENZAPRINE HCL 10 MG PO TABS
5.0000 mg | ORAL_TABLET | Freq: Three times a day (TID) | ORAL | Status: DC
  Administered 2022-10-09 – 2022-10-11 (×7): 5 mg via ORAL
  Filled 2022-10-09 (×7): qty 1

## 2022-10-09 NOTE — Consult Note (Addendum)
PHARMACY - TOTAL PARENTERAL NUTRITION CONSULT NOTE   Indication:  Poor PO intake with planned surgical intervention   Patient Measurements: Height: 5' 6.5" (168.9 cm) Weight: 71.1 kg (156 lb 12 oz) IBW/kg (Calculated) : 60.45 TPN AdjBW (KG): 61.2 Body mass index is 24.92 kg/m.   Assessment: 62 yo female with PMH including recurrent diverticulitis, asthma/COPD, GERD.  Patient presented due to abdominal pain and was diagnosed with diverticulitis.  Currently receiving IV antibiotics with ceftriaxone and flagyl.  Plan now if for patient to go to OR on 9/20 for sigmoid colectomy.  Glucose / Insulin: No PMH of diabetes --SSI stopped d/t no requirements Electrolytes: WNL Renal: Scr < 1 Hepatic: LFTs mildly elevated in admission will order labs for tomorrow Intake / Output; net (+) 3.9 L GI Imaging: 9/18 CT abd/pel: Diverticulitis with abscess GI Surgeries / Procedures:  Sigmoid colectomy planned for 9/20  Central access: PICC line 10/03/22 TPN start date: 10/03/22  Nutritional Goals: Goal TPN rate is 65 mL/hr (provides 87.4 g of protein and 1685 kcals per day)  RD Assessment: Estimated Needs Total Energy Estimated Needs: 1600-1800kcal/day Total Protein Estimated Needs: 80-90g/day Total Fluid Estimated Needs: 1.7-2.0L/day  Current Nutrition: advanced to soft diet   Plan:  ---continue TPN at reduced rate of 30 mL/hr ---Electrolytes in TPN (standard): Na 2mEq/L, K 58mEq/L, Ca 71mEq/L, Mg 74mEq/L, and Phos 74mmol/L. Cl:Ac 1:1 ---Add standard MVI and trace elements to TPN ---Monitor TPN labs on Mon/Thurs, and daily until labs are stable  Lowella Bandy, PharmD 10/09/2022,7:04 AM

## 2022-10-09 NOTE — Progress Notes (Signed)
[facility] SURGICAL PROGRESS NOTE  Hospital Day(s): 11.   Post op day(s): 5 Days Post-Op.   Interval History:  Patient seen and examined No acute events or new complaints overnight.  Patient reports she is having increase lower abdominal pain; feels this is musculoskeletal and worse with movements Also reporting dysuria this morning; "burning when she pees." No longer with nausea/emesis No fever, chills  No new labs Up with PT FLD + 1/2 rate TPN Having flatus  Vital signs in last 24 hours: [min-max] current  Temp:  [97.5 F (36.4 C)-97.9 F (36.6 C)] 97.6 F (36.4 C) (09/25 0409) Pulse Rate:  [70-80] 70 (09/25 0409) Resp:  [16-18] 16 (09/25 0409) BP: (101-129)/(69-81) 105/69 (09/25 0409) SpO2:  [92 %-95 %] 93 % (09/25 0409) Weight:  [71.1 kg] 71.1 kg (09/25 0500)     Height: 5' 6.5" (168.9 cm) Weight: 71.1 kg BMI (Calculated): 24.92   Intake/Output last 2 shifts:  09/24 0701 - 09/25 0700 In: 1107.1 [I.V.:1107.1] Out: -    Physical Exam:  Constitutional: alert, cooperative and no distress  Respiratory: breathing non-labored at rest  Cardiovascular: regular rate and sinus rhythm  Gastrointestinal: soft, incisional soreness is unchanged; right > left, and non-distended, no rebound/guarding Integumentary: Laparotomy is CDI with staples, no erythema  Labs:     Latest Ref Rng & Units 10/08/2022    8:03 AM 10/07/2022    5:27 AM 10/06/2022    5:10 AM  CBC  WBC 4.0 - 10.5 K/uL 7.3  8.5  8.9   Hemoglobin 12.0 - 15.0 g/dL 84.6  96.2  95.2   Hematocrit 36.0 - 46.0 % 36.1  36.2  35.4   Platelets 150 - 400 K/uL 298  292  265       Latest Ref Rng & Units 10/07/2022    5:27 AM 10/05/2022    5:12 AM 10/04/2022    6:19 AM  CMP  Glucose 70 - 99 mg/dL 841  324  401   BUN 8 - 23 mg/dL 11  8  6    Creatinine 0.44 - 1.00 mg/dL 0.27  2.53  6.64   Sodium 135 - 145 mmol/L 137  138  139   Potassium 3.5 - 5.1 mmol/L 4.2  3.7  3.7   Chloride 98 - 111 mmol/L 101  104   101   CO2 22 - 32 mmol/L 28  26  29    Calcium 8.9 - 10.3 mg/dL 8.8  8.7  8.5   Total Protein 6.5 - 8.1 g/dL 6.5   6.7   Total Bilirubin 0.3 - 1.2 mg/dL 0.5   0.4   Alkaline Phos 38 - 126 U/L 60   57   AST 15 - 41 U/L 51   46   ALT 0 - 44 U/L 42   46      Imaging studies: No new pertinent imaging studies   Assessment/Plan:  62 y.o. female 5 Days Post-Op s/p sigmoid colectomy with Baker anastomosis, incidental appendectomy for recurrent complicated diverticulitis    - Will advance to soft diet - Will continue 1/2 rate TPN one more day - Added UA given reports of dysuria   - Monitor abdominal examination; on-going bowel function   - Pain control prn; antiemetics prn   - Mobilize; PT following  - Further management per primary service; we will follow    - Discharge Planning: Doing well; slow improvements. Will advance diet. Hopefully home in 24-48 hours.   All of the above  findings and recommendations were discussed with the patient, and the medical team, and all of patient's questions were answered to her expressed satisfaction.  -- Lynden Oxford, PA-C [facility] 10/09/2022, 7:54 AM M-F: 7am - 4pm

## 2022-10-09 NOTE — Progress Notes (Signed)
Progress Note   Patient: Stacy Gilmore ZOX:[id number] DOB: 1960-11-02 DOA: 09/28/2022     11 DOS: the patient was seen and examined on 10/09/2022   Brief hospital course:  Stacy Gilmore is a 62 y.o. female with medical history significant of recurrent diverticulitis, asthma/COPD, GERD, presented with worsening of LLQ pain. Patient has a history of recurrent diverticulitis, first and second episode in January and May this year.  About 5 days ago patient started to have similar LLQ pain, episodic, subsided on its own.  Patient did not feel the pain significantly affected her daily life and did not come to seek medical advice.  This morning patient woke up with 10/10 sharp like pain in the lower left lower quadrant associated with 2 loose bowel movement and nausea but no vomiting she also had chills but no fever. She is admitted for further management, seen by surgery team advised conservative management. Pain control, diet advancement gradually. Her pain, nausea did not improve. Repeat CT abdomen though reassuring, she does not have clinical improvement. Surgery team recommended open sigmoid colectomy and possible small bowel resection planned for 10/04/22. Started on TPN. 9/24 she is able to tolerate diet.     Assessment and Plan:  Acute complicated diverticulitis with bowel perforation, abscess formation and colon/intestine fistula formation,  Acute peritonitis secondary to above. S/p Partial colectomy, takedown of the splenic flexure, appendectomy 10/04/22. Taper down TPN. She is able to tolerate soft diet. Continue IV pain meds, IV antiemetics. Continue ceftriaxone and Flagyl therapy.   Hypokalemia Resolved post supplements.   Leukopenia - Improved. stable, monitor daily CBC.   COPD No exacerbation. She is on room air. Continue bronchodilators PRN.   GERD Stable, continue PPI   Homeless TOC for discharge needs.   UTI Symptomatic with dysuria Urinalysis shows  pyuria Treat empirically with Rocephin 1 g IV daily until urine culture results become available      Subjective: Complains of burning with urination.  Tolerating p.o. diet  Physical Exam: Vitals:   10/08/22 2021 10/09/22 0409 10/09/22 0500 10/09/22 0809  BP: 107/79 105/69  123/72  Pulse: 74 70  72  Resp: 16 16  16   Temp: 97.9 F (36.6 C) 97.6 F (36.4 C)  97.8 F (36.6 C)  TempSrc:      SpO2: 95% 93%  93%  Weight:   71.1 kg   Height:       General - Middle aged Caucasian female, no distress HEENT - PERRLA, EOMI, atraumatic head, non tender sinuses. Lung - Clear, rales, rhonchi, wheezes. Heart - S1, S2 heard, no murmurs, rubs, trace pedal edema. Abdomen - soft, midline sutures noted, no bleeding or discharge Neuro - Alert, awake and oriented x 3, non focal exam. Skin - Warm and dry.    Data Reviewed: Labs reviewed.  Within normal limits There are no new results to review at this time.  Family Communication: With patient  Disposition: Status is: Inpatient Remains inpatient appropriate because: Remains on TPN post abdominal surgery  Planned Discharge Destination: Home    Time spent: 33 minutes  Author: Lucile Shutters, MD 10/09/2022 1:51 PM  For on call review [email or web address].

## 2022-10-09 NOTE — Progress Notes (Signed)
PT Cancellation Note  Patient Details Name: Stacy Gilmore MRN: [id number] DOB: [birth year]   Cancelled Treatment:    Reason Eval/Treat Not Completed: Other (comment). PT returned this date/time (second attempt per patient request). On re-attempt patient supine in bed, patient refusing therapy session due to lethargy. Requesting to get some sleep despite PT encouragement. Will reassess at later date/time.   Howie Ill, PT, DPT 10/09/22 2:53 PM

## 2022-10-09 NOTE — Progress Notes (Signed)
Mobility Specialist - Progress Note   10/09/22 1113  Mobility  Activity Ambulated with assistance in hallway  Level of Assistance Standby assist, set-up cues, supervision of patient - no hands on  Assistive Device None  Distance Ambulated (ft) 160 ft  Activity Response Tolerated well  $Mobility charge 1 Mobility  Mobility Specialist Start Time (ACUTE ONLY) 1059  Mobility Specialist Stop Time (ACUTE ONLY) 1111  Mobility Specialist Time Calculation (min) (ACUTE ONLY) 12 min   Pt supine upon entry, utilizing RA. Pt expressing continued R sided stomach pain, agreeable to OOB amb this date. Pt completed bed mob and STS indep-- holding on to object within the room upon entering the hallway. Pt amb one lap around the NS, intermittently holding onto the raling "as a habit" during amb. Pt returned to the room, left supine with needs within reach.  Zetta Bills Mobility Specialist 10/09/22 11:26 AM

## 2022-10-09 NOTE — Progress Notes (Signed)
PT Cancellation Note  Patient Details Name: Stacy Gilmore MRN: [id number] DOB: 1960/10/30   Cancelled Treatment:    Reason Eval/Treat Not Completed: Other (comment): Patient supine in bed with family member at bedside. Refused PT services this date/time due to just  mobilized with Mobility Specialist. Pt agreeable for therapist to check back this afternoon.    Howie Ill, PT, DPT 10/09/22 11:39 AM

## 2022-10-10 DIAGNOSIS — K5720 Diverticulitis of large intestine with perforation and abscess without bleeding: Secondary | ICD-10-CM | POA: Diagnosis not present

## 2022-10-10 LAB — PHOSPHORUS: Phosphorus: 4.8 mg/dL — ABNORMAL HIGH (ref 2.5–4.6)

## 2022-10-10 LAB — COMPREHENSIVE METABOLIC PANEL
ALT: 58 U/L — ABNORMAL HIGH (ref 0–44)
AST: 47 U/L — ABNORMAL HIGH (ref 15–41)
Albumin: 2.9 g/dL — ABNORMAL LOW (ref 3.5–5.0)
Alkaline Phosphatase: 95 U/L (ref 38–126)
Anion gap: 11 (ref 5–15)
BUN: 11 mg/dL (ref 8–23)
CO2: 27 mmol/L (ref 22–32)
Calcium: 8.9 mg/dL (ref 8.9–10.3)
Chloride: 97 mmol/L — ABNORMAL LOW (ref 98–111)
Creatinine, Ser: 0.39 mg/dL — ABNORMAL LOW (ref 0.44–1.00)
GFR, Estimated: >60 mL/min (ref >60)
Glucose, Bld: 112 mg/dL — ABNORMAL HIGH (ref 70–99)
Potassium: 3.9 mmol/L (ref 3.5–5.1)
Sodium: 135 mmol/L (ref 135–145)
Total Bilirubin: 0.6 mg/dL (ref 0.3–1.2)
Total Protein: 6.3 g/dL — ABNORMAL LOW (ref 6.5–8.1)

## 2022-10-10 LAB — CBC
HCT: 35.3 % — ABNORMAL LOW (ref 36.0–46.0)
Hemoglobin: 11.4 g/dL — ABNORMAL LOW (ref 12.0–15.0)
MCH: 27.4 pg (ref 26.0–34.0)
MCHC: 32.3 g/dL (ref 30.0–36.0)
MCV: 84.9 fL (ref 80.0–100.0)
Platelets: 318 10*3/uL (ref 150–400)
RBC: 4.16 MIL/uL (ref 3.87–5.11)
RDW: 13.8 % (ref 11.5–15.5)
WBC: 7.1 10*3/uL (ref 4.0–10.5)
nRBC: 0 % (ref 0.0–0.2)

## 2022-10-10 LAB — MAGNESIUM: Magnesium: 1.8 mg/dL (ref 1.7–2.4)

## 2022-10-10 LAB — SURGICAL PATHOLOGY

## 2022-10-10 NOTE — Discharge Instructions (Addendum)
In addition to included general post-operative instructions,  Diet: Resume home diet.   Activity: No heavy lifting >20 pounds (children, pets, laundry, garbage) or strenuous activity for 6 weeks from date of surgery, but light activity and walking are encouraged. Do not drive or drink alcohol if taking narcotic pain medications or having pain that might distract from driving.  Wound care: You may shower/get incision wet with soapy water and pat dry (do not rub incisions), but no baths or submerging incision underwater until follow-up.   Medications: Resume all home medications. For mild to moderate pain: acetaminophen (Tylenol) or ibuprofen/naproxen (if no kidney disease). Combining Tylenol with alcohol can substantially increase your risk of causing liver disease. Narcotic pain medications, if prescribed, can be used for severe pain, though may cause nausea, constipation, and drowsiness. Do not combine Tylenol and Percocet (or similar) within a 6 hour period as Percocet (and similar) contain(s) Tylenol. If you do not need the narcotic pain medication, you do not need to fill the prescription.  Call office [phone] / [phone]) at any time if any questions, worsening pain, fevers/chills, bleeding, drainage from incision site, or other concerns.      Food Resources  Agency Name: [facility] Agency Address: [address] [place], [place] [address] Phone: [phone] Website: [email or web address] Service(s) Offered: Housing services, self-sufficiency, congregate meal program, weatherization program, Event organiser program, emergency food assistance,  housing counseling, home ownership program, wheels - to work program.  [employer] free for 60 and older at various locations from [employer], Monday-Friday:  [employer], [address] [place], [phone] -[facility], [address], [place] [phone]  -[facility], [address] [place] [phone]  -[address], [place], [phone]  Agency Name: [facility] Address: [phone] [address], [place], [place] [address] Phone: [phone] Website: [email or web address] Service(s) Offered: Home delivered hot, frozen, and emergency  meals. Grocery assistance program which matches  volunteers one-on-one with seniors unable to grocery shop  for themselves. Must be 60 years and older; less than 20  hours of in-home aide service, limited or no driving ability;  live alone or with someone with a disability; live in  [place].  Agency Name: [facility]) Address: [address] [place], [place] [address] Phone: [phone] Service(s) Offered: Food is served to shut-ins, homeless, elderly, and low income people in the community every Saturday (11:30 am-12:30 pm) and Sunday (12:30 pm-1:30pm). Volunteers also offer help and encouragement in seeking employment,  and spiritual guidance.  Agency Name: Department of Social Services Address: [address] [place], [place] [address] Phone: [phone] Service(s) Offered: Child support services; child welfare services; food stamps; Medicaid; work first family assistance; and aid with fuel,  rent, food and medicine.  Agency Name: [facility]itian Address: [address], [place], [place] Phone: [phone] Website: [email or web address] Services Offered: Monday 10:00am-12:00, 8:00pm-9:00pm, and Friday 10:00am-12:00.  Agency Name: [employer] [facility] [place] Address: [address] [place], [place] [address] Phone: [phone] Website: [email or web address] Service(s) Offered: Serves weekday meals, open from 11:30 am- 1:00 pm., and 6:30-7:30pm, Monday-Wednesday-Friday distributes food 3:30-6pm, Monday-Wednesday-Friday.  Agency Name: [facility] Address: [address], [place], [place] Phone: [phone] Website: [email or web address] Services Offered: Distributes food the 4th Saturday of the month, starting at 8:00 am  Agency Name: [facility] Address: [phone] [address], [place], [place] [address] Phone: [phone] Website: [email or web address] Service(s) Offered: Bread of life, weekly  food pantry. Open Wednesdays from 10:00am-noon.  Agency Name: [facility] [employer] Address: [address] [place], [place], [place] Phone: [phone] Services Offered: Distributes food 9am-1pm, Monday-Thursday. Call for details.  Agency Name: [facility] Address: [address], [place], [place] [address] Phone: [phone] Website: [email or web address] Service(s) Offered: Games developer. Call for assistance.  Agency Name: [facility] Address: [address] [place], [place] [address] Phone: [phone] Service Offered: Emergency Food Pantry. Call for appointment.  Agency Name: [facility] Address: [address] [place], [place] [address] Phone: [phone] Website: [email or web address] Services Offered: Games developer. Call for details  Agency Name: [facility] Address: [address] [place], [place] Phone: [phone] Website: [email or web address] Service(s) Offered: Emergency Food Pantry. Call for details.  Agency Name: Holiday representative Address: [address], [place], [place] [address] Phone: [phone] or [phone] Website: [email or web address] Service(s) Offered: Distribute food 9am-11:30 am, Tuesday-Friday, and 1-3:30pm, Monday-Friday. Food pantry Monday-Friday 1pm-3pm, fresh items, Mon.-Wed.-Fri.  Agency Name: [facility] Address: [address] [place], [place] [address] Phone: [phone] Website: [email or web address] Services Offered: Distribute food Tues and Sats from  9:00am-noon. Closed 1st Saturday of each month. Call for details  Rent/Utility/Housing  Agency Name: [facility] Agency Address: [address] [place], [place] [address] Phone: [phone] Email: [email or web address] Website: [email or web address] Service(s) Offered: Housing services, self-sufficiency, congregate meal program, weatherization program, Field seismologist program, emergency food assistance,  housing counseling, home ownership program, wheels -towork program.  Agency Name: [facility] Address: [address], [place], [place] [address] Phone: [phone] (8a-4p) [phone] (8p- 10p) Email: [email or web address] Website: [email or web address] Service(s) Offered: A program for homeless and/or needy men that includes one-on-one counseling, life skills training and job rehabilitation.  Agency Name: [employer] [facility] [place] Address: [address], [place], [place] [address] Phone: [phone] Website: [email or web address] Service(s) Offered: Assistance to needy in emergency with utility bills, heating fuel, and prescriptions. Shelter for homeless 7pm-7am. May 09, 2016 15  Agency Name: [facility] [place] (Developmentally Disabled) Address: [address], [place], [place] [address] Phone: [phone] Contact Person: Cathleen Corti Email: [email or web address] Website: [email or web address] Service(s) Offered: Helps individuals with developmental disabilities move from housing that is more restrictive to homes where they  can achieve greater independence and have more  opportunities.  Agency Name: [facility] Address: [address], [place], [place] [address] Phone: [phone] Email: [email or web address] Website: www.[email or web address].org Service(s) Offered: Provides affordable housing for low-income families, elderly, and disabled individuals. Offer a  wide range of  programs and services, from financial planning to afterschool and summer programs.  Agency Name: Department of Social Services Address: [address] [place], [place] [address] Phone: [phone] Service(s) Offered: Child support services; child welfare services; food stamps; Medicaid; work first family assistance; and aid with fuel,  rent, food and medicine.  Agency Name: [facility] [place] [employer]. Address: [facility] [address], [place], [place]  [address] Phone: [phone] Website: www.familyabuseservices.org Service(s) Offered: 24 hour Crisis Line: [phone]; 24 hour Emergency Shelter; Transitional Housing; Support Groups; Scientist, physiological; [employer]; Hispanic Outreach: [phone];  Visitation Center: [phone].  Agency Name: [facility] [place]. Address: [address] [place], [place] [address] Phone: [phone] Service(s) Offered: CAP Services; Home and [employer]; Individual or Group Supports; Respite Care Non-Institutional Nursing;  Residential Supports; Respite Care and Personal Care Services; Transportation; Family and Friends Night; Recreational Activities; Three Nutritious Meals/Snacks; Consultation with  Registered Dietician; Twenty-four hour Registered Nurse Access; Daily and [employer]; Camp Green Leaves; [place] for the [employer] (During Summer Months) Bingo Night (Every  Wednesday Night); Special Populations Dance Night  (Every Tuesday Night); Professional Hair Care Services.  Agency Name: [facility] Address: [address], [place], [place] [address] Phone: [phone] Contact Person: Jabier Mutton Website: [email or web address].Physicist, medical) Offered: Residential treatment facility for women; food and  clothing, educational & employment development and  transportation to work; Counsellor of financial skills;  parenting  and family reunification; emotional and spiritual  support; transitional housing for program graduates.  Agency Name: [facility] Address: [address], [place], [place] [address] Phone: [phone] Email: [email or web address] Website: [email or web address].es Service(s) Offered: Public housing units for elderly, disabled, and low income people; housing choice vouchers for income eligible  applicants; shelter plus care vouchers; and Psychologist, clinical.  Agency Name: [facility] [employer] Address: [address] [place], [place] [address] Phone: [phone] Email: [email or web address] Website: www.habitatalamance.org Service(s) Offered: Build houses for families in need of decent housing. Each adult in the family must invest 200 hours of labor on  someone else's house, work with volunteers to build their own house, attend classes on budgeting, home maintenance, yard care, and attend homeowner association meetings.  Agency Name: [facility] Address: [address] [place], [place] [address] Phone: [phone] Website: www.rsli.org Service(s) Offered: Intermediate care facilities for intellectually delayed, Supervised Living in group homes for adults with developmental disabilities, Supervised Living for people who have dual diagnoses (MRMI), Independent Living, Supported Living, respite and a variety of CAP services, pre-vocational services, day supports, and [employer].  Agency Name: [place] [facility] Phone: [phone] Website: www.NCForeclosurePrevention.gov Service(s) Offered: Zero-interest, deferred loans to homeowners struggling to pay their mortgage. Call for more information.  Transportation Resources  Agency Name: [facility] Agency Address: [address] [place], [place] [address] Phone: [phone] Email: [email or web address] Website:  www.[email or web address].org Service(s) Offered: Housing services, self-sufficiency, congregate meal program, weatherization program, Field seismologist program, emergency food assistance,  housing counseling, home ownership program, wheels-towork program.  Agency Name: [facility] [employer] [phone]) Address: [address], [place], [place] [address] Phone: [phone] Website: [email or web address].[email or web address] Service(s) Offered: Transportation for [employer], subscription and demand response; Dial-a-Ride for citizens 25 years of age or older.  Agency Name: Department of Social Services Address: [address] [place], [place] [address] Phone: [phone] Service(s) Offered: Child support services; child welfare services; food stamps; Medicaid; work first family assistance; and aid with fuel,  rent, food and medicine, transportation assistance.  Agency Name: Disabled [employer] (DAV) Transportation  Network Phone: [phone] Service(s) Offered: Transports veterans to the [facility] medical center. Call  forty-eight hours in advance and leave the name, telephone  number, date, and time of appointment. Veteran will be  contacted by the driver the day before the appointment to  arrange a pick up point   Transportation Resources  Agency Name: [facility] Agency Address: [address] [place], [place] [address] Phone: [phone] Email: [email or web address] Website: [email or web address] Service(s) Offered: Housing services, self-sufficiency, congregate meal program, weatherization program, Field seismologist program, emergency food assistance,  housing counseling, home ownership program, wheels-towork program.  Agency Name: [facility] [employer] [phone]) Address: [address] [place], [place] [address] Phone: [phone] Website: [email or web address].[email or web address] Service(s) Offered:  Transportation for [employer], subscription and demand response; Dial-a-Ride for citizens 68 years of age or older.  Agency  Name: Department of Social Services Address: [address] [place], [place] [address] Phone: [phone] Service(s) Offered: Child support services; child welfare services; food stamps; Medicaid; work first family assistance; and aid with fuel,  rent, food and medicine, transportation assistance.  Agency Name: Disabled [employer] (DAV) Transportation  Network Phone: [phone] Service(s) Offered: Transports veterans to the [facility] medical center. Call  forty-eight hours in advance and leave the name, telephone  number, date, and time of appointment. Veteran will be  contacted by the driver the day before the appointment to  arrange a pick up point    [employer] ACTA currently provides door to door services. ACTA connects with PART daily for services to [facility]. ACTA also performs contract services to [employer] operates 27 vehicles, all but 3 mini-vans are equipped with lifts for special needs as well as the general public. ACTA drivers are each CDL certified and trained in First Aid and CPR. ACTA was established in 2002 by [employer]. An independent Industrial/product designer. ACTA operates via Cytogeneticist with required Research scientist (physical sciences) from [place]. ACTA provides over 80,000 passenger trips each year, including [facility] and [employer] sites.  Call at least by 11 AM one business day prior to needing transportation  [employer].                      [place], [place] [address]     Office Hours: Monday-Friday  8 AM - 5 PM  Agency Name: [facility] Agency Address: [address], [place], [place] [address] Phone: [phone] Website: www.alamanceservices.org Service(s)  Offered: Housing services, self-sufficiency, congregate meal program, and individual development account program.  Agency Name: [facility] [place] Address: [address] [place], [place] [address] Phone: [phone] Email: [email or web address] Website: www.alliedchurches.org Service(s) Offered: Housing the homeless, feeding the hungry, Company secretary, job and education related services.  Agency Name: [facility] Address: [address], [place], [place] [address] Phone: [phone] Email: [email or web address] Service(s) Offered: Counseling, problem pregnancy, advocacy for Hispanics, limited emergency financial assistance.  Agency Name: Department of Social Services Address: [address] [place], [place] [address] Phone: [phone] Website: [email or web address] Service(s) Offered: Child support services; child welfare services; SNAP; Medicaid; work first family assistance; and aid with fuel,  rent, food and medicine.  Agency Name: Holiday representative Address: [address] [place], [place] [address] Phone: [phone] or [phone] Email: [email or web address] Service(s) Offered: Family services and transient assistance; emergency food, fuel, clothing, limited furniture, utilities; budget counseling, general counseling; give a kid a coat; thrift store; Christmas food and toys. Utility assistance, food pantry, rental  assistance, life sustaining medicine

## 2022-10-10 NOTE — Progress Notes (Signed)
Physical Therapy Treatment & Discharge  Patient Details Name: Stacy Gilmore MRN: [id number] DOB: September 30, 1960 Today's Date: 10/10/2022   History of Present Illness 62 y/o female presented to ED on 09/28/22 for intermittent LLQ abdominal pain since January with worsening pain in past 2 days. CT abdomen showed complicated diverticulitis. S/p partial colectomy and incidental appendectomy on 9/20. PMH: asthma/COPD, diverticulitis, GERD    PT Comments  Patient continues to report pain in her abdomen although resting comfortably in bed on arrival. Patient completing bed mobility and sit to stand modI. Ambulatory in hallway with use of IV pole and modI. Slow gait speed with wide BOS. No further skilled PT needs identified. Will defer further mobility to mobility specialists and nursing staff. PT will complete orders at this time.     If plan is discharge home, recommend the following: Help with stairs or ramp for entrance;Assist for transportation;Assistance with cooking/housework   Can travel by private vehicle        Equipment Recommendations  Rolling Stacy Gilmore (2 wheels)    Recommendations for Other Services       Precautions / Restrictions Precautions Precautions: Fall Restrictions Weight Bearing Restrictions: No     Mobility  Bed Mobility Overal bed mobility: Modified Independent                  Transfers Overall transfer level: Modified independent Equipment used: None                    Ambulation/Gait Ambulation/Gait assistance: Modified independent (Device/Increase time) Gait Distance (Feet): 180 Feet Assistive device: IV Pole Gait Pattern/deviations: Step-through pattern, Decreased stride length, Trunk flexed Gait velocity: decreased     General Gait Details: slow gait speed. utilizing IV pole for support due to reports of pain   Stairs             Wheelchair Mobility     Tilt Bed    Modified Rankin (Stroke Patients Only)        Balance                                            Cognition Arousal: Alert Behavior During Therapy: WFL for tasks assessed/performed Overall Cognitive Status: Within Functional Limits for tasks assessed                                          Exercises      General Comments        Pertinent Vitals/Pain Pain Assessment Pain Assessment: Faces Faces Pain Scale: Hurts even more Pain Location: abdomen Pain Descriptors / Indicators: Grimacing, Guarding Pain Intervention(s): Monitored during session    Home Living                          Prior Function            PT Goals (current goals can now be found in the care plan section) Acute Rehab PT Goals PT Goal Formulation: With patient Time For Goal Achievement: 10/21/22 Potential to Achieve Goals: Good Progress towards PT goals: Goals met/education completed, patient discharged from PT    Frequency    Min 1X/week      PT Plan      Co-evaluation  AM-PAC PT "6 Clicks" Mobility   Outcome Measure  Help needed turning from your back to your side while in a flat bed without using bedrails?: None Help needed moving from lying on your back to sitting on the side of a flat bed without using bedrails?: None Help needed moving to and from a bed to a chair (including a wheelchair)?: None Help needed standing up from a chair using your arms (e.g., wheelchair or bedside chair)?: None Help needed to walk in hospital room?: None Help needed climbing 3-5 steps with a railing? : None 6 Click Score: 24    End of Session   Activity Tolerance: Patient limited by pain Patient left: in bed;with call bell/phone within reach;with family/visitor present Nurse Communication: Mobility status PT Visit Diagnosis: Muscle weakness (generalized) (M62.81);Pain     Time: 3220-2542 PT Time Calculation (min) (ACUTE ONLY): 9 min  Charges:    $Therapeutic Activity: 8-22  mins PT General Charges $$ ACUTE PT VISIT: 1 Visit                     Stacy Gilmore, PT, DPT Physical Therapist - [facility]  [facility]    Stacy Gilmore A Stacy Gilmore 10/10/2022, 12:54 PM

## 2022-10-10 NOTE — Progress Notes (Signed)
[facility] SURGICAL PROGRESS NOTE  Hospital Day(s): 12.   Post op day(s): 6 Days Post-Op.   Interval History:  Patient seen and examined No acute events or new complaints overnight.  Patient reports her abdominal pain is improving; Flexeril helped Baseline minimal nausea No fever, chills, emesis  Remains without leukocytosis; 7.1K Hgb to 11.4; stable  Renal function normal; sCr - 0.39; UO - unmeasured Mild hyperphosphatemia to 4.8; otherwise no electrolyte derangements  Up with PT - making improvements; ambulated around unit  Soft diet + 1/2 rate TPN Having flatus + BM x3 recorded   Vital signs in last 24 hours: [min-max] current  Temp:  [97.8 F (36.6 C)-98.1 F (36.7 C)] 97.9 F (36.6 C) (09/26 0333) Pulse Rate:  [67-73] 67 (09/26 0333) Resp:  [16-20] 18 (09/26 0333) BP: (115-135)/(71-75) 116/75 (09/26 0333) SpO2:  [93 %-97 %] 97 % (09/26 0333) Weight:  [69.1 kg] 69.1 kg (09/26 0333)     Height: 5' 6.5" (168.9 cm) Weight: 69.1 kg BMI (Calculated): 24.22   Intake/Output last 2 shifts:  No intake/output data recorded.   Physical Exam:  Constitutional: alert, cooperative and no distress  Respiratory: breathing non-labored at rest  Cardiovascular: regular rate and sinus rhythm  Gastrointestinal: soft, incisional soreness is unchanged; right > left, and non-distended, no rebound/guarding Integumentary: Laparotomy is CDI with staples, no erythema  Labs:     Latest Ref Rng & Units 10/10/2022    5:53 AM 10/09/2022    9:23 AM 10/08/2022    8:03 AM  CBC  WBC 4.0 - 10.5 K/uL 7.1  8.6  7.3   Hemoglobin 12.0 - 15.0 g/dL 16.1  09.6  04.5   Hematocrit 36.0 - 46.0 % 35.3  35.5  36.1   Platelets 150 - 400 K/uL 318  303  298       Latest Ref Rng & Units 10/10/2022    5:53 AM 10/07/2022    5:27 AM 10/05/2022    5:12 AM  CMP  Glucose 70 - 99 mg/dL 409  811  914   BUN 8 - 23 mg/dL 11  11  8    Creatinine 0.44 - 1.00 mg/dL 7.82  9.56  2.13   Sodium 135 - 145  mmol/L 135  137  138   Potassium 3.5 - 5.1 mmol/L 3.9  4.2  3.7   Chloride 98 - 111 mmol/L 97  101  104   CO2 22 - 32 mmol/L 27  28  26    Calcium 8.9 - 10.3 mg/dL 8.9  8.8  8.7   Total Protein 6.5 - 8.1 g/dL 6.3  6.5    Total Bilirubin 0.3 - 1.2 mg/dL 0.6  0.5    Alkaline Phos 38 - 126 U/L 95  60    AST 15 - 41 U/L 47  51    ALT 0 - 44 U/L 58  42       Imaging studies: No new pertinent imaging studies   Assessment/Plan:  62 y.o. female 6 Days Post-Op s/p sigmoid colectomy with Baker anastomosis, incidental appendectomy for recurrent complicated diverticulitis    - Continue soft diet - Discontinue TPN today  - Agree with Abx for UTI; Rocephin   - Monitor abdominal examination; on-going bowel function   - Pain control prn; antiemetics prn   - Mobilize; PT following  - Further management per primary service; we will follow    - Discharge Planning: She would like one more day, which is reasonable. I do  think she is ready for DC. Will plan for DC home tomorrow (09/27) with 1 week follow up for staple removal.   All of the above findings and recommendations were discussed with the patient, and the medical team, and all of patient's questions were answered to her expressed satisfaction.  -- Lynden Oxford, PA-C [facility] 10/10/2022, 7:08 AM M-F: 7am - 4pm

## 2022-10-10 NOTE — TOC Progression Note (Addendum)
Transition of Care [facility]) - Progression Note    Patient Details  Name: Stacy Gilmore MRN: [id number] Date of Birth: 14-Oct-1960  Transition of Care [facility]) CM/SW Contact  Chapman Fitch, RN Phone Number: 10/10/2022, 9:14 AM  Clinical Narrative:      Food, housing, transportation, and utilities resources added to AVS   Per surgery DC TPN today and anticipated DC tomorrow  Patient declined PT x2 yesterday   Patient states that she was admitted from [facility] but has "used all her days".  She states that she plans to discharge toher fiance's brothers house at [address] [place], [place] [address].  She states she will need a taxi voucher at discharge.  Plan to use [facility] pharmacy for discharge medication.  Pharmacist aware   Printed comprehensive list of shelter resources to the unit, Bedside RN to provide to patient  RW referral made to Jon with [employer]  Expected Discharge Plan: Home/Self Care Barriers to Discharge: Continued Medical Work up  Expected Discharge Plan and Services       Living arrangements for the past 2 months: Single Family Home                                       Social Determinants of Health (SDOH) Interventions SDOH Screenings   Food Insecurity: Food Insecurity Present (09/28/2022)  Housing: High Risk (09/28/2022)  Transportation Needs: Unmet Transportation Needs (09/28/2022)  Utilities: At Risk (09/28/2022)  Alcohol Screen: Low Risk  (03/19/2022)  Depression (PHQ2-9): High Risk (03/19/2022)  Financial Resource Strain: High Risk (03/19/2022)  Physical Activity: Inactive (03/19/2022)  Social Connections: Socially Isolated (03/19/2022)  Stress: Stress Concern Present (03/19/2022)  Tobacco Use: High Risk (10/04/2022)    Readmission Risk Interventions     No data to display

## 2022-10-10 NOTE — Progress Notes (Signed)
Progress Note   Patient: Stacy Gilmore ZOX:[id number] DOB: 07-01-1960 DOA: 09/28/2022     12 DOS: the patient was seen and examined on 10/10/2022   Brief hospital course: Stacy Gilmore is a 62 y.o. female with medical history significant of recurrent diverticulitis, asthma/COPD, GERD, presented with worsening of LLQ pain. Patient has a history of recurrent diverticulitis, first and second episode in January and May this year.  About 5 days ago patient started to have similar LLQ pain, episodic, subsided on its own.  Patient did not feel the pain significantly affected her daily life and did not come to seek medical advice.  This morning patient woke up with 10/10 sharp like pain in the lower left lower quadrant associated with 2 loose bowel movement and nausea but no vomiting she also had chills but no fever. She is admitted for further management, seen by surgery team advised conservative management. Pain control, diet advancement gradually. Her pain, nausea did not improve. Repeat CT abdomen though reassuring, she does not have clinical improvement. Surgery team recommended open sigmoid colectomy and possible small bowel resection planned for 10/04/22. Started on TPN. 9/24 she is able to tolerate diet.     Assessment and Plan:  Acute complicated diverticulitis with bowel perforation, abscess formation and colon/intestine fistula formation,  Acute peritonitis secondary to above. S/p Partial colectomy, takedown of the splenic flexure, appendectomy 10/04/22. Taper down TPN. She is able to tolerate soft diet and has had several bowel movements Continue IV pain meds, IV antiemetics. Continue ceftriaxone and Flagyl therapy. Ambulate as tolerated   Hypokalemia Resolved post supplements.   Leukopenia - Improved. stable, monitor daily CBC.   COPD No exacerbation. She is on room air. Continue bronchodilators PRN.   GERD Stable, continue PPI   Homeless TOC for discharge needs.      UTI Symptomatic with dysuria Urinalysis shows pyuria Treat empirically with Rocephin 1 g IV daily until urine culture results become available        Subjective: No new complaints.  Concerned about discharge as she states she has nowhere to go.  Physical Exam: Vitals:   10/09/22 1624 10/09/22 1918 10/10/22 0333 10/10/22 0809  BP: 135/72 115/71 116/75 (!) 124/90  Pulse: 73 69 67 80  Resp: 20 18 18 18   Temp: 98.1 F (36.7 C) 98 F (36.7 C) 97.9 F (36.6 C) 97.7 F (36.5 C)  TempSrc:  Oral Oral   SpO2: 94% 94% 97% 96%  Weight:   69.1 kg   Height:       General - Middle aged Caucasian female, no distress HEENT - PERRLA, EOMI, atraumatic head, non tender sinuses. Lung - Clear,  Heart - S1, S2 heard, no murmurs, rubs, trace pedal edema. Abdomen - soft, midline sutures noted, no bleeding or discharge Neuro - Alert, awake and oriented x 3, non focal exam. Skin - Warm and dry.    Data Reviewed: Labs reviewed There are no new results to review at this time.   Family Communication: Plan of care discussed with patient and her spouse  Disposition: Status is: Inpatient Remains inpatient appropriate because: For discharge in a.m.  Planned Discharge Destination: Homeless.  To be determined    Time spent: 30 minutes  Author: Lucile Shutters, MD 10/10/2022 12:54 PM  For on call review [email or web address].

## 2022-10-11 ENCOUNTER — Other Ambulatory Visit: Payer: Self-pay

## 2022-10-11 DIAGNOSIS — K5720 Diverticulitis of large intestine with perforation and abscess without bleeding: Secondary | ICD-10-CM | POA: Diagnosis not present

## 2022-10-11 DIAGNOSIS — E876 Hypokalemia: Secondary | ICD-10-CM | POA: Diagnosis present

## 2022-10-11 MED ORDER — ENSURE ENLIVE PO LIQD
237.0000 mL | Freq: Three times a day (TID) | ORAL | 3 refills | Status: AC
  Filled 2022-10-11: qty 237, 1d supply, fill #0

## 2022-10-11 MED ORDER — OXYCODONE HCL 5 MG PO TABS
5.0000 mg | ORAL_TABLET | Freq: Four times a day (QID) | ORAL | 0 refills | Status: AC | PRN
  Filled 2022-10-11: qty 20, 5d supply, fill #0

## 2022-10-11 MED ORDER — PANTOPRAZOLE SODIUM 40 MG PO TBEC
40.0000 mg | DELAYED_RELEASE_TABLET | Freq: Every day | ORAL | 0 refills | Status: DC
  Filled 2022-10-11: qty 30, 30d supply, fill #0

## 2022-10-11 NOTE — Progress Notes (Signed)
[facility] SURGICAL PROGRESS NOTE  Hospital Day(s): 13.   Post op day(s): 7 Days Post-Op.   Interval History:  Patient seen and examined No acute events or new complaints overnight.  Patient reports she continues to have vaginal itching Incisional soreness is improved; flexeril helped significantly No fever, chills, emesis  No new labs this morning  Up with PT - making improvements; ambulated around unit  Soft diet Having flatus + BM recorded   Vital signs in last 24 hours: [min-max] current  Temp:  [97.6 F (36.4 C)-98 F (36.7 C)] 97.7 F (36.5 C) (09/27 0529) Pulse Rate:  [67-80] 67 (09/27 0529) Resp:  [18-19] 19 (09/27 0529) BP: (102-124)/(59-90) 110/79 (09/27 0529) SpO2:  [93 %-96 %] 95 % (09/27 0529)     Height: 5' 6.5" (168.9 cm) Weight: 69.1 kg BMI (Calculated): 24.22   Intake/Output last 2 shifts:  09/26 0701 - 09/27 0700 In: 960 [P.O.:960] Out: -    Physical Exam:  Constitutional: alert, cooperative and no distress  Respiratory: breathing non-labored at rest  Cardiovascular: regular rate and sinus rhythm  Gastrointestinal: soft, incisional soreness is unchanged; right > left, and non-distended, no rebound/guarding Integumentary: Laparotomy is CDI with staples, no erythema  Labs:     Latest Ref Rng & Units 10/10/2022    5:53 AM 10/09/2022    9:23 AM 10/08/2022    8:03 AM  CBC  WBC 4.0 - 10.5 K/uL 7.1  8.6  7.3   Hemoglobin 12.0 - 15.0 g/dL 16.1  09.6  04.5   Hematocrit 36.0 - 46.0 % 35.3  35.5  36.1   Platelets 150 - 400 K/uL 318  303  298       Latest Ref Rng & Units 10/10/2022    5:53 AM 10/07/2022    5:27 AM 10/05/2022    5:12 AM  CMP  Glucose 70 - 99 mg/dL 409  811  914   BUN 8 - 23 mg/dL 11  11  8    Creatinine 0.44 - 1.00 mg/dL 7.82  9.56  2.13   Sodium 135 - 145 mmol/L 135  137  138   Potassium 3.5 - 5.1 mmol/L 3.9  4.2  3.7   Chloride 98 - 111 mmol/L 97  101  104   CO2 22 - 32 mmol/L 27  28  26    Calcium 8.9 - 10.3 mg/dL 8.9   8.8  8.7   Total Protein 6.5 - 8.1 g/dL 6.3  6.5    Total Bilirubin 0.3 - 1.2 mg/dL 0.6  0.5    Alkaline Phos 38 - 126 U/L 95  60    AST 15 - 41 U/L 47  51    ALT 0 - 44 U/L 58  42       Imaging studies: No new pertinent imaging studies   Assessment/Plan:  62 y.o. female 7 Days Post-Op s/p sigmoid colectomy with Baker anastomosis, incidental appendectomy for recurrent complicated diverticulitis    - Continue soft diet  - Monitor abdominal examination; on-going bowel function   - Pain control prn; antiemetics prn   - Mobilize; PT following  - Further management per primary service; we will follow    - Discharge Planning: From a surgical perspective, I do think she is ready for home as she is tolerating a diet, pain is reasonably controlled, no emesis, labs reassuring, she is ambulating around the unit. She is anxious about going home. I have arranged follow up on 10/03 for staple removal.  Instructions updated as well.   All of the above findings and recommendations were discussed with the patient, and the medical team, and all of patient's questions were answered to her expressed satisfaction.  -- Lynden Oxford, PA-C [facility] 10/11/2022, 7:00 AM M-F: 7am - 4pm

## 2022-10-11 NOTE — Plan of Care (Signed)
The patient has been discharged. IV removed. PICC line removed. Taxi voucher given.  Problem: Education: Goal: Knowledge of General Education information will improve Description: Including pain rating scale, medication(s)/side effects and non-pharmacologic comfort measures Outcome: Completed/Met   Problem: Health Behavior/Discharge Planning: Goal: Ability to manage health-related needs will improve Outcome: Completed/Met   Problem: Clinical Measurements: Goal: Ability to maintain clinical measurements within normal limits will improve Outcome: Completed/Met Goal: Will remain free from infection Outcome: Completed/Met Goal: Diagnostic test results will improve Outcome: Completed/Met Goal: Respiratory complications will improve Outcome: Completed/Met Goal: Cardiovascular complication will be avoided Outcome: Completed/Met   Problem: Activity: Goal: Risk for activity intolerance will decrease Outcome: Completed/Met   Problem: Nutrition: Goal: Adequate nutrition will be maintained Outcome: Completed/Met   Problem: Coping: Goal: Level of anxiety will decrease Outcome: Completed/Met   Problem: Elimination: Goal: Will not experience complications related to bowel motility Outcome: Completed/Met Goal: Will not experience complications related to urinary retention Outcome: Completed/Met   Problem: Pain Managment: Goal: General experience of comfort will improve Outcome: Completed/Met   Problem: Safety: Goal: Ability to remain free from injury will improve Outcome: Completed/Met   Problem: Skin Integrity: Goal: Risk for impaired skin integrity will decrease Outcome: Completed/Met   Problem: Increased Nutrient Needs (NI-5.1) Goal: Food and/or nutrient delivery Description: Individualized approach for food/nutrient provision. Outcome: Completed/Met

## 2022-10-11 NOTE — Progress Notes (Signed)
   10/11/22 1200  Spiritual Encounters  Type of Visit Initial  Care provided to: Pt and family  Referral source Chaplain assessment  Reason for visit Routine spiritual support  OnCall Visit No  Spiritual Framework  Presenting Themes Goals in life/care;Significant life change;Impactful experiences and emotions  Community/Connection Significant other  Patient Stress Factors Financial concerns  Family Stress Factors Financial concerns  Interventions  Spiritual Care Interventions Made Encouragement;Reflective listening;Compassionate presence;Decision-making support/facilitation  Spiritual Care Plan  Spiritual Care Issues Still Outstanding No further spiritual care needs at this time (see row info)   Chaplain went to visit patient while rounding. Patient and her partner asked the chaplain if I had any resources for shelters. Patient stated that they will be kicked out of their home on October 8th and they will have no where to live. Chaplain gave patient the flyer and number for [facility] to assist with shelter and food. Patient and partner thanked the chaplain for her help.

## 2022-10-11 NOTE — TOC Transition Note (Signed)
Transition of Care [facility]) - CM/SW Discharge Note   Patient Details  Name: Stacy Gilmore MRN: [id number] Date of Birth: [birth year]  Transition of Care [facility]) CM/SW Contact:  Darolyn Rua, LCSW Phone Number: 10/11/2022, 11:59 AM   Clinical Narrative:     Patient to discharge home today, confirmed with patient she received shelter list yesterday 9/26 and reports she will choose shelter and taxi voucher to be provided. She reports no additional discharge needs at this time.    Final next level of care: Homeless Shelter Barriers to Discharge: No Barriers Identified   Patient Goals and CMS Choice CMS Medicare.gov Compare Post Acute Care list provided to:: Patient Choice offered to / list presented to : Patient  Discharge Placement                         Discharge Plan and Services Additional resources added to the After Visit Summary for                                       Social Determinants of Health (SDOH) Interventions SDOH Screenings   Food Insecurity: Food Insecurity Present (09/28/2022)  Housing: High Risk (09/28/2022)  Transportation Needs: Unmet Transportation Needs (09/28/2022)  Utilities: At Risk (09/28/2022)  Alcohol Screen: Low Risk  (03/19/2022)  Depression (PHQ2-9): High Risk (03/19/2022)  Financial Resource Strain: High Risk (03/19/2022)  Physical Activity: Inactive (03/19/2022)  Social Connections: Socially Isolated (03/19/2022)  Stress: Stress Concern Present (03/19/2022)  Tobacco Use: High Risk (10/04/2022)     Readmission Risk Interventions     No data to display

## 2022-10-11 NOTE — Discharge Summary (Signed)
Physician Discharge Summary   Patient: Stacy Gilmore MRN: [id number] DOB: [birth year]  Admit date:     09/28/2022  Discharge date: 10/11/22  Discharge Physician: Chimamanda Siegfried   PCP: Sherlyn Hay, DO   Recommendations at discharge:   Keep scheduled follow-up appointment with surgery  Discharge Diagnoses: Principal Problem:   Diverticulitis of intestine with abscess, recurrent Active Problems:   History of COPD   History of gastroesophageal reflux (GERD)  Resolved Problems:   * No resolved hospital problems. *  Hospital Course: Stacy Gilmore is a 62 y.o. female with medical history significant of recurrent diverticulitis, asthma/COPD, GERD, presented with worsening of LLQ pain.   Patient has a history of recurrent diverticulitis, first and second episode in January and May this year.  About 5 days ago patient started to have similar LLQ pain, episodic, subsided on its own.  Patient did not feel the pain significantly affected her daily life and did not come to seek medical advice.  This morning patient woke up with 10/10 sharp like pain in the lower left lower quadrant associated with 2 loose bowel movement and nausea but no vomiting she also had chills but no fever.   ED Course: Temperature 98.3 blood pressure 107/92, CT abdomen pelvis showed complicated diverticulitis with communication between distal descending colon and adjacent small bowel likely fistula formation and/or interloop abscess.  WBC 4.2, hemoglobin 14   K3.2 AST 56, ALT 54 patient was given IV bolus, morphine, Zofran, and Cipro and Flagyl in the ED    Assessment and Plan:  Acute complicated diverticulitis with bowel perforation, abscess formation and colon/intestine fistula formation,  Acute peritonitis secondary to above. S/p Partial colectomy, takedown of the splenic flexure, appendectomy 10/04/22. Patient was on TPN but able to tolerate p.o. now Completed antibiotic therapy with ceftriaxone and  Flagyl therapy. Follow-up with surgery as an outpatient     Hypokalemia Resolved post supplements.   Leukopenia - Improved. stable, monitor daily CBC.   COPD No exacerbation. She is on room air. Continue bronchodilators PRN.   GERD Stable, continue PPI   Homeless TOC for discharge needs Will be discharged to a shelter     UTI Symptomatic with dysuria Urinalysis shows pyuria Your call has been forwarded completed antibiotic therapy         Consultants: Surgery Procedures performed: Partial colectomy, takedown of splenic flexure, appendectomy Disposition: Home Diet recommendation:  Discharge Diet Orders (From admission, onward)     Start     Ordered   10/11/22 0000  Diet general        10/11/22 1221           Regular diet DISCHARGE MEDICATION: Allergies as of 10/11/2022       Reactions   Bee Venom Swelling   Penicillins Hives, Swelling   Tolerated ceftriaxone   Armoracia Rusticana Ext (horseradish) Rash   Blisters the mouth   Other Rash   Ragu spaghetti sauce        Medication List     STOP taking these medications    omeprazole 20 MG capsule Commonly known as: PRILOSEC Replaced by: pantoprazole 40 MG tablet       TAKE these medications    Breo Ellipta 100-25 MCG/ACT Aepb Generic drug: fluticasone furoate-vilanterol Inhale 1 puff into the lungs daily. Rinse and brush thoroughly after administration   feeding supplement Liqd Take 237 mLs by mouth 3 (three) times daily between meals.   multivitamin with minerals Tabs tablet Take 1 tablet  by mouth daily.   oxyCODONE 5 MG immediate release tablet Commonly known as: Oxy IR/ROXICODONE Take 1 tablet (5 mg total) by mouth every 6 (six) hours as needed for up to 5 days for severe pain or breakthrough pain.   pantoprazole 40 MG tablet Commonly known as: PROTONIX Take 1 tablet (40 mg total) by mouth daily. Start taking on: October 12, 2022 Replaces: omeprazole 20 MG capsule    senna-docusate 8.6-50 MG tablet Commonly known as: Senokot-S Take 1 tablet by mouth at bedtime as needed for mild constipation.   Ventolin HFA 108 (90 Base) MCG/ACT inhaler Generic drug: albuterol Inhale 2 puffs into the lungs every 4 (four) hours as needed for wheezing or shortness of breath.               Durable Medical Equipment  (From admission, onward)           Start     Ordered   10/10/22 1420  For home use only DME Walker rolling  Once       Question Answer Comment  Walker: With 5 Inch Wheels   Patient needs a walker to treat with the following condition Weakness      10/10/22 1419            Follow-up Information     [name], MD. Go on 10/17/2022.   Specialty: General Surgery Why: go to appointment on 10/03 at 215 PM Contact information: [address] [place] [address] [phone]                Discharge Exam: Ceasar Mons Weights   10/08/22 0347 10/09/22 0500 10/10/22 0333  Weight: 70.9 kg 71.1 kg 69.1 kg   General - Middle aged Caucasian female, no distress HEENT - PERRLA, EOMI, atraumatic head, non tender sinuses. Lung - Clear,  Heart - S1, S2 heard, no murmurs, rubs, trace pedal edema. Abdomen - soft, midline sutures noted, no bleeding or discharge Neuro - Alert, awake and oriented x 3, non focal exam. Skin - Warm and dry.    Condition at discharge: stable  The results of significant diagnostics from this hospitalization (including imaging, microbiology, ancillary and laboratory) are listed below for reference.   Imaging Studies: [place] EKG SITE RITE  Result Date: 10/03/2022 If Site Rite image not attached, placement could not be confirmed due to current cardiac rhythm.  CT ABDOMEN PELVIS W CONTRAST  Result Date: 10/02/2022 CLINICAL DATA:  Diverticulitis. Worsening abdominal pain and nausea. Follow-up coloenteric fistula. EXAM: CT ABDOMEN AND PELVIS WITH CONTRAST TECHNIQUE: Multidetector CT imaging of the  abdomen and pelvis was performed using the standard protocol following bolus administration of intravenous contrast. RADIATION DOSE REDUCTION: This exam was performed according to the departmental dose-optimization program which includes automated exposure control, adjustment of the mA and/or kV according to patient size and/or use of iterative reconstruction technique. CONTRAST:  OMNIPAQUE IOHEXOL 300 MG/ML  SOLN COMPARISON:  09/28/2022 FINDINGS: Lower Chest: No acute findings. Hepatobiliary: No suspicious hepatic masses identified. Prior cholecystectomy. No evidence of biliary obstruction. Pancreas:  No mass or inflammatory changes. Spleen: Stable mild splenomegaly measuring approximately 14 cm in length. Adrenals/Urinary Tract: No suspicious masses identified. No evidence of ureteral calculi or hydronephrosis. Stomach/Bowel: Mild diverticulitis is again seen involving the proximal sigmoid colon. A fistula is again seen along the anterior wall of the colon which terminates in a small fluid and gas collection, consistent with abscess. This currently measures 1.7 x 1.0 cm, decreased in size from  2.0 x 2.0 cm on previous study. This collection abuts several left lower quadrant small bowel loops, and fistulous communication with the small bowel loops cannot be excluded. Vascular/Lymphatic: No pathologically enlarged lymph nodes. No acute vascular findings. Reproductive:  No mass or other significant abnormality. Other:  None. Musculoskeletal:  No suspicious bone lesions identified. IMPRESSION: Mild sigmoid diverticulitis, with small diverticular abscess which has decreased in size since previous study. This abscess abuts several left lower quadrant small bowel loops, and coloenteric fistula cannot be excluded. Stable mild splenomegaly. Electronically Signed   By: Danae Orleans M.D.   On: 10/02/2022 16:49   CT ABDOMEN PELVIS W CONTRAST  Result Date: 09/28/2022 CLINICAL DATA:  Evaluate for diverticulitis.  Complains of intermittent left lower quadrant abdominal pain. EXAM: CT ABDOMEN AND PELVIS WITH CONTRAST TECHNIQUE: Multidetector CT imaging of the abdomen and pelvis was performed using the standard protocol following bolus administration of intravenous contrast. RADIATION DOSE REDUCTION: This exam was performed according to the departmental dose-optimization program which includes automated exposure control, adjustment of the mA and/or kV according to patient size and/or use of iterative reconstruction technique. CONTRAST:  80mL OMNIPAQUE IOHEXOL 300 MG/ML  SOLN COMPARISON:  07/30/2022 FINDINGS: Lower chest: No acute abnormality. Hepatobiliary: No focal liver abnormality. Hypertrophy of the lateral segment of left hepatic lobe, squaring of the caudate lobe, and widening of the falciform ligament noted. Status post cholecystectomy. Mild intrahepatic bile duct dilatation and common bile duct dilatation appears similar to previous imaging. The CBD measures 1 cm on today's exam, image 36/5. Stable from 06/19/2022. No calcified common bile duct stones noted Pancreas: Unremarkable. No pancreatic ductal dilatation or surrounding inflammatory changes. Spleen: Spleen measures 13.7 cm, image 49/5. Unchanged from previous exam. No focal splenic abnormality. Adrenals/Urinary Tract: Adrenal glands are unremarkable. Kidneys are normal, without renal calculi, focal lesion, or hydronephrosis. Bladder is unremarkable. Stomach/Bowel: Stomach appears normal. The appendix is visualized and is within normal limits. The colon is largely decompressed with mild diffuse low attenuation wall thickening and intramural fatty deposition. Mild inflammatory fat stranding is noted in the left lower quadrant of the abdomen. At the site of the previous distal descending there is signs of fistulous communication the colon to the adjacent small bowel, image 34/5 and sagittal image 86/6 and axial image 58/2. There is a low-attenuation, peripherally  enhancing fluid collection involving the affected adjacent small bowel which measures 2 cm, image 55/2. Vascular/Lymphatic: Aortic atherosclerosis. No aneurysm. Prominent upper abdominal lymph nodes are identified. Porta patent node measures 9 mm, image 24/2. No retroperitoneal adenopathy. No pelvic or inguinal adenopathy. Reproductive: Increased, bilateral parametrial vascularity is similar to previous exam. No uterine or adnexal mass. Other: No free fluid or fluid collections identified within the abdomen or pelvis. No signs of pneumoperitoneum. Musculoskeletal: No acute or significant osseous findings. IMPRESSION: 1. Persistent, mild inflammatory fat stranding within the left lower quadrant of the abdomen at the site of the previous distal descending colon diverticulitis. Signs of fistulous communication between the distal descending colon and the adjacent small bowel is noted with a low-attenuation, peripherally enhancing fluid collection involving the affected adjacent small bowel which measures 2 cm. 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 2. Mild diffuse low attenuation wall thickening and intramural fatty deposition throughout the colon. Findings are nonspecific but can be seen in the setting of chronic inflammatory bowel disease. 3. Stable mild intrahepatic and common bile duct dilatation status post  cholecystectomy. No calcified common bile duct stones noted. 4. Stable mild splenomegaly. 5. Increased, bilateral parametrial vascularity is similar to previous exam. This is a nonspecific finding but can be seen in the setting of pelvic congestion syndrome. 6.  Aortic Atherosclerosis (ICD10-I70.0). Electronically Signed   By: Signa Kell M.D.   On: 09/28/2022 09:32    Microbiology: Results for orders placed or performed during the hospital encounter of 09/28/22  Blood culture (routine x  2)     Status: None   Collection Time: 09/28/22  9:55 AM   Specimen: BLOOD  Result Value Ref Range Status   Specimen Description BLOOD BLOOD RIGHT FOREARM  Final   Special Requests   Final    BOTTLES DRAWN AEROBIC AND ANAEROBIC Blood Culture adequate volume   Culture   Final    NO GROWTH 5 DAYS Performed at [facility], [address] [place], [place] [address]    Report Status 10/03/2022 FINAL  Final  Blood culture (routine x 2)     Status: None   Collection Time: 09/28/22 10:18 AM   Specimen: BLOOD  Result Value Ref Range Status   Specimen Description BLOOD Blood Culture adequate volume  Final   Special Requests NONE  Final   Culture   Final    NO GROWTH 5 DAYS Performed at [facility], [address] [place], [place] [address]    Report Status 10/03/2022 FINAL  Final    Labs: CBC: Recent Labs  Lab 10/05/22 0823 10/06/22 0510 10/07/22 0527 10/08/22 0803 10/09/22 0923 10/10/22 0553  WBC 10.9* 8.9 8.5 7.3 8.6 7.1  NEUTROABS 9.1*  --   --   --   --   --   HGB 11.5* 11.2* 11.9* 11.4* 11.6* 11.4*  HCT 35.8* 35.4* 36.2 36.1 35.5* 35.3*  MCV 87.7 86.1 84.4 85.7 84.5 84.9  PLT 267 265 292 298 303 318   Basic Metabolic Panel: Recent Labs  Lab 10/05/22 0512 10/07/22 0527 10/10/22 0553  NA 138 137 135  K 3.7 4.2 3.9  CL 104 101 97*  CO2 26 28 27   GLUCOSE 142* 121* 112*  BUN 8 11 11   CREATININE 0.50 0.42* 0.39*  CALCIUM 8.7* 8.8* 8.9  MG 1.7 2.1 1.8  PHOS 3.0 3.9 4.8*   Liver Function Tests: Recent Labs  Lab 10/05/22 0512 10/07/22 0527 10/10/22 0553  AST  --  51* 47*  ALT  --  42 58*  ALKPHOS  --  60 95  BILITOT  --  0.5 0.6  PROT  --  6.5 6.3*  ALBUMIN 3.1* 3.1* 2.9*   CBG: Recent Labs  Lab 10/06/22 1155 10/06/22 1701 10/06/22 2318 10/07/22 0842 10/07/22 1735  GLUCAP 135* 131* 130* 139* 141*    Discharge time spent: greater than 30 minutes.  Signed: Lucile Shutters, MD [facility] 10/11/2022

## 2022-10-15 ENCOUNTER — Telehealth: Payer: Self-pay

## 2022-10-15 NOTE — Congregational Nurse Program (Signed)
  Dept: [phone]   Congregational Nurse Program Note  Date of Encounter: 10/15/2022 Client to [facility] day center with request for assistance in setting up Medicaid transportation for her upcoming surgical post-op apt on 10/3. With Dr. Claudine Mouton. Transportation set, client to be picked up at [address], [place] [phone] at 1:15 for a 1:45 apt, return ride set for 2:45 pm. Reference number [id number]. Francesco Runner BSN, RN Past Medical History: Past Medical History:  Diagnosis Date   Acute diverticulitis 06/19/2022   Asthma    COPD (chronic obstructive pulmonary disease) (HCC)    Diverticulitis of colon 07/25/2022   GERD (gastroesophageal reflux disease)    History of diverticulitis     Encounter Details:  CNP Questionnaire - 10/15/22 0935       Questionnaire   Ask client: Do you give verbal consent for me to treat you today? Yes    Student Assistance N/A    Location Patient Served  [facility]    Visit Setting with Client Organization    Patient Status Unhoused   client is currenlty staying with her Brother in Education officer, environmental Medicaid   Client now has [place] Medicaid   Insurance/Financial Assistance Referral N/A    Medication N/A   medications provided through [facility] Provider No   New patient apt made at [facility] family practice   Screening Referrals Made N/A    Medical Referrals Made [facility]   [facility]   Medical Appointment Made [facility] PCP[facility]clinic   New patient apt made at [facility] practice for 10/21 at 1:20 pm   Recently w/o PCP, now 1st time PCP visit completed due to CNs referral or appointment made N/A    Food N/A   has food stamps, but reports her food was in the refrigerator in the house she "got kicked out of". Will have meals provided at the [employer] Provided transportation assistance   Assisted client in setting up Medicaid transportation  for her post op f/u on 103   Housing/Utilities No permanent housing    Interpersonal Safety N/A    Interventions Advocate/Support;Navigate Healthcare System;Educate    Abnormal to Normal Screening Since Last CN Visit N/A    Screenings CN Performed Blood Pressure;Pulse Ox    Sent Client to Lab for: N/A    Did client attend any of the following based off CNs referral or appointments made? N/A    ED Visit Averted N/A   no ED visit needed   Life-Saving Intervention Made N/A

## 2022-10-15 NOTE — Transitions of Care (Post Inpatient/ED Visit) (Signed)
10/15/2022  Name: Stacy Gilmore MRN: [id number] DOB: 09-Jun-1960  Today's TOC FU Call Status: Today's TOC FU Call Status:: Successful TOC FU Call Completed TOC FU Call Complete Date: 10/15/22 Patient's Name and Date of Birth confirmed.  Transition Care Management Follow-up Telephone Call Date of Discharge: 10/11/22 Discharge Facility: [facility]) Type of Discharge: Inpatient Admission Primary Inpatient Discharge Diagnosis:: "Diverticulitis of intestine with abscess, recurrent"; underwent colon resection on 9/26 How have you been since you were released from the hospital?: Better Any questions or concerns?: No  Items Reviewed: Did you receive and understand the discharge instructions provided?: Yes Medications obtained,verified, and reconciled?: Yes (Medications Reviewed) Any new allergies since your discharge?: No Dietary orders reviewed?: Yes Type of Diet Ordered:: "general" Do you have support at home?: Yes People in Home: significant other  Medications Reviewed Today: Medications Reviewed Today     Reviewed by Marcos Eke, RN (Registered Nurse) on 10/15/22 at 1144  Med List Status: <None>   Medication Order Taking? Sig Documenting Provider Last Dose Status Informant  albuterol (VENTOLIN HFA) 108 (90 Base) MCG/ACT inhaler [id number] Yes Inhale 2 puffs into the lungs every 4 (four) hours as needed for wheezing or shortness of breath. Andreas Ohm, NP Taking Active Self           Med Note Mliss Fritz, Albirda Shiel A   Tue Oct 15, 2022 11:42 AM) As needed  feeding supplement (ENSURE ENLIVE / ENSURE PLUS) LIQD [id number] No Take 237 mLs by mouth 3 (three) times daily between meals. Lucile Shutters, MD Unknown Active   fluticasone furoate-vilanterol (BREO ELLIPTA) 100-25 MCG/ACT AEPB [id number] Yes Inhale 1 puff into the lungs daily. Rinse and brush thoroughly after administration Tukov-Yual, Alroy Bailiff, NP Taking Active Self  Multiple  Vitamin (MULTIVITAMIN WITH MINERALS) TABS tablet [id number] No Take 1 tablet by mouth daily.  Patient not taking: Reported on 09/28/2022   Marcelino Duster, MD Not Taking Active Self  oxyCODONE (OXY IR/ROXICODONE) 5 MG immediate release tablet [id number] Yes Take 1 tablet (5 mg total) by mouth every 6 (six) hours as needed for up to 5 days for severe pain or breakthrough pain. Lucile Shutters, MD Taking Active   pantoprazole (PROTONIX) 40 MG tablet [id number] Yes Take 1 tablet (40 mg total) by mouth daily. Lucile Shutters, MD Taking Active   senna-docusate (SENOKOT-S) 8.6-50 MG tablet [id number] Yes Take 1 tablet by mouth at bedtime as needed for mild constipation. Marcelino Duster, MD Taking Active Self           Med Note Mliss Fritz, Dawna Jakes A   Tue Oct 15, 2022 11:44 AM) As needed            Home Care and Equipment/Supplies: Were Home Health Services Ordered?: No Any new equipment or medical supplies ordered?: No  Functional Questionnaire: Do you need assistance with bathing/showering or dressing?: No Do you need assistance with meal preparation?: No Do you need assistance with eating?: No Do you have difficulty maintaining continence: No Do you need assistance with getting out of bed/getting out of a chair/moving?: No Do you have difficulty managing or taking your medications?: No  Follow up appointments reviewed: PCP Follow-up appointment confirmed?: Yes Date of PCP follow-up appointment?: 11/04/22 Follow-up Provider: Dr. Jacquenette Shone Specialist [facility] Follow-up appointment confirmed?: Yes Date of Specialist follow-up appointment?: 10/17/22 Follow-Up Specialty Provider:: Dr. Claudine Mouton (General Surgery, going to have staples removed) Do you need transportation to your follow-up appointment?: No (patient states she has already called to have  set up) Do you understand care options if your condition(s) worsen?: Yes-patient verbalized understanding  SDOH Interventions Today     Flowsheet Row Most Recent Value  SDOH Interventions   Food Insecurity Interventions --  [has EBT]      Alyse Low, RN, BA, [facility], CRRN [facility] Population Health Care Management Coordinator, Transition of Care Ph # [phone]

## 2022-10-17 ENCOUNTER — Encounter: Payer: Medicaid Other | Admitting: Surgery

## 2022-10-24 ENCOUNTER — Encounter: Payer: Self-pay | Admitting: General Surgery

## 2022-10-24 ENCOUNTER — Ambulatory Visit: Payer: Medicaid Other | Admitting: General Surgery

## 2022-10-24 ENCOUNTER — Encounter: Payer: Medicaid Other | Admitting: Surgery

## 2022-10-24 ENCOUNTER — Other Ambulatory Visit: Payer: Self-pay

## 2022-10-24 VITALS — BP 122/89 | HR 69 | Temp 98.5°F | Ht 66.5 in | Wt 136.9 lb

## 2022-10-24 DIAGNOSIS — K5720 Diverticulitis of large intestine with perforation and abscess without bleeding: Secondary | ICD-10-CM

## 2022-10-24 DIAGNOSIS — K5732 Diverticulitis of large intestine without perforation or abscess without bleeding: Secondary | ICD-10-CM

## 2022-10-24 DIAGNOSIS — Z09 Encounter for follow-up examination after completed treatment for conditions other than malignant neoplasm: Secondary | ICD-10-CM

## 2022-10-24 MED ORDER — IBUPROFEN 600 MG PO TABS
600.0000 mg | ORAL_TABLET | Freq: Four times a day (QID) | ORAL | 0 refills | Status: DC | PRN
  Filled 2022-10-24: qty 30, 8d supply, fill #0

## 2022-10-24 MED ORDER — PANTOPRAZOLE SODIUM 40 MG PO TBEC
40.0000 mg | DELAYED_RELEASE_TABLET | Freq: Every day | ORAL | 0 refills | Status: DC
Start: 2022-10-24 — End: 2023-01-28
  Filled 2022-10-24 (×2): qty 30, 30d supply, fill #0

## 2022-10-24 MED ORDER — NITROFURANTOIN MONOHYD MACRO 100 MG PO CAPS
100.0000 mg | ORAL_CAPSULE | Freq: Two times a day (BID) | ORAL | 0 refills | Status: AC
  Filled 2022-10-24: qty 10, 5d supply, fill #0

## 2022-10-24 NOTE — Progress Notes (Signed)
Ms. Harlon Flor returns 2 weeks status post sigmoid colectomy for diverticulitis.  She reports doing well although is having some lower abdominal pain.  She is not taking anything for pain because she cannot afford the medications.  She is tolerating a diet and having normal bowel movements without any blood.  She denies any nausea or vomiting.  He also reports pain when she urinates and a foul odor from her urine.  On exam her abdomen is soft, nondistended there is some tenderness in the right lower quadrant and the left lower quadrant but there is no evidence of peritonitis.  Her staples are intact and there is just a little bit of reactive erythema but no signs of infection from the wound.  Pathology was reviewed and consistent with diverticulitis.  Staples were removed today and Steri-Strips dressed on the wound.  We have prescribed her Tylenol at a free pharmacy so that she can have some pain control.  I will also empirically treat a UTI.  She can return to Dr. Claudine Mouton in 4 weeks.

## 2022-10-24 NOTE — Patient Instructions (Addendum)
We have sent a prescription in for Ibuprofen to be taken every day.  We will send you in a prescription for antibiotics for your UTI.  Do try to eat a high protein diet. You may not feel hungry but do eat.   Follow up here in 1 months.    GENERAL POST-OPERATIVE PATIENT INSTRUCTIONS   WOUND CARE INSTRUCTIONS:  Keep a dry clean dressing on the wound if there is drainage. The initial bandage may be removed after 24 hours.  Once the wound has quit draining you may leave it open to air.  If clothing rubs against the wound or causes irritation and the wound is not draining you may cover it with a dry dressing during the daytime.  Try to keep the wound dry and avoid ointments on the wound unless directed to do so.  If the wound becomes bright red and painful or starts to drain infected material that is not clear, please contact your physician immediately.  If the wound is mildly pink and has a thick firm ridge underneath it, this is normal, and is referred to as a healing ridge.  This will resolve over the next 4-6 weeks.  BATHING: You may shower if you have been informed of this by your surgeon. However, Please do not submerge in a tub, hot tub, or pool until incisions are completely sealed or have been told by your surgeon that you may do so.  DIET:  You may eat any foods that you can tolerate.  It is a good idea to eat a high fiber diet and take in plenty of fluids to prevent constipation.  If you do become constipated you may want to take a mild laxative or take ducolax tablets on a daily basis until your bowel habits are regular.  Constipation can be very uncomfortable, along with straining, after recent surgery.  ACTIVITY:  You are encouraged to cough and deep breath or use your incentive spirometer if you were given one, every 15-30 minutes when awake.  This will help prevent respiratory complications and low grade fevers post-operatively if you had a general anesthetic.  You may want to hug a  pillow when coughing and sneezing to add additional support to the surgical area, if you had abdominal or chest surgery, which will decrease pain during these times.  You are encouraged to walk and engage in light activity for the next two weeks.  You should not lift more than 20 pounds for 6 weeks total after surgery as it could put you at increased risk for complications.  Twenty pounds is roughly equivalent to a plastic bag of groceries. At that time- Listen to your body when lifting, if you have pain when lifting, stop and then try again in a few days. Soreness after doing exercises or activities of daily living is normal as you get back in to your normal routine.  MEDICATIONS:  Try to take narcotic medications and anti-inflammatory medications, such as tylenol, ibuprofen, naprosyn, etc., with food.  This will minimize stomach upset from the medication.  Should you develop nausea and vomiting from the pain medication, or develop a rash, please discontinue the medication and contact your physician.  You should not drive, make important decisions, or operate machinery when taking narcotic pain medication.  SUNBLOCK Use sun block to incision area over the next year if this area will be exposed to sun. This helps decrease scarring and will allow you avoid a permanent darkened area over your incision.  QUESTIONS:  Please feel free to call our office if you have any questions, and we will be glad to assist you. [phone]

## 2022-10-25 ENCOUNTER — Other Ambulatory Visit: Payer: Self-pay

## 2022-10-29 ENCOUNTER — Ambulatory Visit: Payer: Medicaid Other | Admitting: Gastroenterology

## 2022-11-04 ENCOUNTER — Ambulatory Visit: Payer: Self-pay | Admitting: Family Medicine

## 2022-11-04 NOTE — Progress Notes (Deleted)
New patient visit   Patient: Stacy Gilmore   DOB: [birth year]   62 y.o. Female  MRN: [id number] Visit Date: 11/04/2022  Today's healthcare provider: Sherlyn Hay, DO   No chief complaint on file.  Subjective    [name] is a 62 y.o. female who presents today as a new patient to establish care.  HPI    Recent diverticulitis status post partial colectomy with primary/EEA stapled anastomosis, takedown of the splenic flexure and incidental appendectomy 10/04/2022  ***  Past Medical History:  Diagnosis Date   Acute diverticulitis 06/19/2022   Asthma    COPD (chronic obstructive pulmonary disease) (HCC)    Diverticulitis of colon 07/25/2022   GERD (gastroesophageal reflux disease)    History of diverticulitis    Past Surgical History:  Procedure Laterality Date   APPENDECTOMY N/A 10/04/2022   Procedure: APPENDECTOMY;  Surgeon: [name], MD;  Location: [facility] ORS;  Service: General;  Laterality: N/A;   CERVICAL SPINE SURGERY     CHOLECYSTECTOMY     COLON RESECTION SIGMOID N/A 10/04/2022   Procedure: COLON RESECTION SIGMOID;  Surgeon: [name], MD;  Location: [facility] ORS;  Service: General;  Laterality: N/A;  Splenic Flexure Takedown   LAPAROTOMY N/A 10/04/2022   Procedure: EXPLORATION LAPAROTOMY;  Surgeon: [name], MD;  Location: [facility] OR[facility];  Service: General;  Laterality: N/A;   TUBAL LIGATION     Family Status  Relation Name Status   Mother  Deceased   Father  Alive   Sister  Alive   Brother  Deceased   MGM  Deceased   MGF  Deceased   PGM  Deceased   PGF  Deceased  No partnership data on file   Family History  Problem Relation Age of Onset   Anxiety disorder Mother    Lung cancer Mother    Thyroid disease Mother    Epilepsy Mother    Other Father        unknown medical history   Breast cancer Sister    Cervical cancer Sister    Epilepsy Brother    Other Maternal Grandmother        unknown medical history   Other Maternal  Grandfather        unknown medical history   Diabetes Paternal Grandmother    Other Paternal Grandfather        unknown medical history   Social History   Socioeconomic History   Marital status: Unknown    Spouse name: Not on file   Number of children: Not on file   Years of education: Not on file   Highest education level: Not on file  Occupational History   Not on file  Tobacco Use   Smoking status: Every Day    Current packs/day: 0.25    Average packs/day: 0.3 packs/day for 40.0 years (10.0 ttl pk-yrs)    Types: Cigarettes    Passive exposure: Past   Smokeless tobacco: Never   Tobacco comments:    [place] Quit info given, patient down to 2 cigarettes per day  Vaping Use   Vaping status: Never Used  Substance and Sexual Activity   Alcohol use: Yes    Alcohol/week: 8.0 standard drinks of alcohol    Types: 8 Cans of beer per week    Comment: 06/19/22 drank a 40oz beer a few days ago, 1/2 of a 15 pack of beer weekly, last use ~ 02/04/22   Drug use: Not Currently    Types: "  Crack" cocaine, Cocaine, Methamphetamines, Marijuana    Comment: smokes Marijuana once per week-last use 01/2022, last use crack cocaine 12/08/21   Sexual activity: Not Currently  Other Topics Concern   Not on file  Social History Narrative   Not on file   Social Determinants of Health   Financial Resource Strain: High Risk (03/19/2022)   Overall Financial Resource Strain (CARDIA)    Difficulty of Paying Living Expenses: Very hard  Food Insecurity: Food Insecurity Present (10/15/2022)   Hunger Vital Sign    Worried About Running Out of Food in the Last Year: Sometimes true    Ran Out of Food in the Last Year: Sometimes true  Transportation Needs: Unmet Transportation Needs (09/28/2022)   PRAPARE - Transportation    Lack of Transportation (Medical): Yes    Lack of Transportation (Non-Medical): Yes  Physical Activity: Inactive (03/19/2022)   Exercise Vital Sign    Days of Exercise per Week: 0 days    Minutes  of Exercise per Session: 0 min  Stress: Stress Concern Present (03/19/2022)   [employer] of Occupational Health - Occupational Stress Questionnaire    Feeling of Stress : Very much  Social Connections: Socially Isolated (03/19/2022)   Social Connection and Isolation Panel [NHANES]    Frequency of Communication with Friends and Family: Never    Frequency of Social Gatherings with Friends and Family: Never    Attends Religious Services: Never    Database administrator or Organizations: No    Attends Engineer, structural: Not on file    Marital Status: Never married   Outpatient Medications Prior to Visit  Medication Sig   albuterol (VENTOLIN HFA) 108 (90 Base) MCG/ACT inhaler Inhale 2 puffs into the lungs every 4 (four) hours as needed for wheezing or shortness of breath.   feeding supplement (ENSURE ENLIVE / ENSURE PLUS) LIQD Take 237 mLs by mouth 3 (three) times daily between meals. (Patient not taking: Reported on 10/24/2022)   fluticasone furoate-vilanterol (BREO ELLIPTA) 100-25 MCG/ACT AEPB Inhale 1 puff into the lungs daily. Rinse and brush thoroughly after administration   ibuprofen (ADVIL) 600 MG tablet Take 1 tablet (600 mg total) by mouth every 6 (six) hours as needed.   Multiple Vitamin (MULTIVITAMIN WITH MINERALS) TABS tablet Take 1 tablet by mouth daily. (Patient not taking: Reported on 10/24/2022)   pantoprazole (PROTONIX) 40 MG tablet Take 1 tablet (40 mg total) by mouth daily.   senna-docusate (SENOKOT-S) 8.6-50 MG tablet Take 1 tablet by mouth at bedtime as needed for mild constipation.   No facility-administered medications prior to visit.   Allergies  Allergen Reactions   Bee Venom Swelling   Penicillins Hives and Swelling    Tolerated ceftriaxone   Armoracia Rusticana Ext (Horseradish) Rash    Blisters the mouth   Other Rash    Ragu spaghetti sauce    Immunization History  Administered Date(s) Administered   Influenza,inj,Quad PF,6+ Mos 01/17/2022     Health Maintenance  Topic Date Due   Hepatitis C Screening  Never done   DTaP/Tdap/Td (1 - Tdap) Never done   Cervical Cancer Screening (HPV/Pap Cotest)  Never done   Colonoscopy  Never done   MAMMOGRAM  Never done   Zoster Vaccines- Shingrix (1 of 2) Never done   INFLUENZA VACCINE  08/15/2022   COVID-19 Vaccine (1 - 2023-24 season) Never done   HIV Screening  Completed   HPV VACCINES  Aged Out    Patient Care Team: Jacquenette Shone  N, DO as PCP - General (Family Medicine)  Review of Systems  {Insert previous labs (optional):23779} {See past labs  Heme  Chem  Endocrine  Serology  Results Review (optional):1}   Objective    There were no vitals taken for this visit. {Insert last BP/Wt (optional):23777}{See vitals history (optional):1}   Physical Exam ***  Depression Screen    03/19/2022   10:36 AM 12/11/2021    1:35 PM  PHQ 2/9 Scores  PHQ - 2 Score 6 2  PHQ- 9 Score 16 10   No results found for any visits on 11/04/22.  Assessment & Plan     There are no diagnoses linked to this encounter.   ***  No follow-ups on file.     I discussed the assessment and treatment plan with the patient  The patient was provided an opportunity to ask questions and all were answered. The patient agreed with the plan and demonstrated an understanding of the instructions.   The patient was advised to call back or seek an in-person evaluation if the symptoms worsen or if the condition fails to improve as anticipated.    Sherlyn Hay, DO  [facility] [phone] (phone) [phone] (fax)  [facility]

## 2022-11-04 NOTE — Assessment & Plan Note (Deleted)
On Breztri and albuterol inhalers

## 2022-11-04 NOTE — Assessment & Plan Note (Deleted)
Albumin 2.9 on 10/10/2022 Will recheck today

## 2022-11-12 ENCOUNTER — Ambulatory Visit: Payer: Self-pay | Admitting: Family Medicine

## 2022-11-12 ENCOUNTER — Encounter: Payer: Self-pay | Admitting: Radiology

## 2022-11-12 ENCOUNTER — Emergency Department: Payer: Medicaid Other

## 2022-11-12 ENCOUNTER — Inpatient Hospital Stay
Admission: EM | Admit: 2022-11-12 | Discharge: 2022-11-15 | DRG: 158 | Disposition: A | Discharge status: Home / Self Care | Payer: Medicaid Other | Attending: Internal Medicine | Admitting: Internal Medicine

## 2022-11-12 ENCOUNTER — Other Ambulatory Visit: Payer: Self-pay

## 2022-11-12 ENCOUNTER — Ambulatory Visit: Payer: Self-pay | Admitting: *Deleted

## 2022-11-12 DIAGNOSIS — Z91030 Bee allergy status: Secondary | ICD-10-CM

## 2022-11-12 DIAGNOSIS — I2510 Atherosclerotic heart disease of native coronary artery without angina pectoris: Secondary | ICD-10-CM | POA: Diagnosis present

## 2022-11-12 DIAGNOSIS — F17200 Nicotine dependence, unspecified, uncomplicated: Secondary | ICD-10-CM | POA: Diagnosis present

## 2022-11-12 DIAGNOSIS — I10 Essential (primary) hypertension: Secondary | ICD-10-CM | POA: Diagnosis present

## 2022-11-12 DIAGNOSIS — J449 Chronic obstructive pulmonary disease, unspecified: Secondary | ICD-10-CM | POA: Diagnosis present

## 2022-11-12 DIAGNOSIS — G8929 Other chronic pain: Secondary | ICD-10-CM | POA: Diagnosis present

## 2022-11-12 DIAGNOSIS — F17211 Nicotine dependence, cigarettes, in remission: Secondary | ICD-10-CM | POA: Diagnosis present

## 2022-11-12 DIAGNOSIS — E878 Other disorders of electrolyte and fluid balance, not elsewhere classified: Secondary | ICD-10-CM | POA: Diagnosis present

## 2022-11-12 DIAGNOSIS — R109 Unspecified abdominal pain: Secondary | ICD-10-CM | POA: Diagnosis present

## 2022-11-12 DIAGNOSIS — Z91018 Allergy to other foods: Secondary | ICD-10-CM

## 2022-11-12 DIAGNOSIS — B1920 Unspecified viral hepatitis C without hepatic coma: Secondary | ICD-10-CM | POA: Diagnosis present

## 2022-11-12 DIAGNOSIS — L0390 Cellulitis, unspecified: Secondary | ICD-10-CM | POA: Diagnosis present

## 2022-11-12 DIAGNOSIS — F1490 Cocaine use, unspecified, uncomplicated: Secondary | ICD-10-CM | POA: Diagnosis present

## 2022-11-12 DIAGNOSIS — Z880 Allergy status to penicillin: Secondary | ICD-10-CM

## 2022-11-12 DIAGNOSIS — L03211 Cellulitis of face: Principal | ICD-10-CM | POA: Diagnosis present

## 2022-11-12 DIAGNOSIS — K122 Cellulitis and abscess of mouth: Secondary | ICD-10-CM | POA: Diagnosis present

## 2022-11-12 DIAGNOSIS — Z8349 Family history of other endocrine, nutritional and metabolic diseases: Secondary | ICD-10-CM | POA: Diagnosis not present

## 2022-11-12 DIAGNOSIS — Z8719 Personal history of other diseases of the digestive system: Secondary | ICD-10-CM

## 2022-11-12 DIAGNOSIS — Z7951 Long term (current) use of inhaled steroids: Secondary | ICD-10-CM | POA: Diagnosis not present

## 2022-11-12 DIAGNOSIS — R7989 Other specified abnormal findings of blood chemistry: Secondary | ICD-10-CM | POA: Diagnosis present

## 2022-11-12 DIAGNOSIS — Z818 Family history of other mental and behavioral disorders: Secondary | ICD-10-CM

## 2022-11-12 DIAGNOSIS — E876 Hypokalemia: Secondary | ICD-10-CM | POA: Diagnosis present

## 2022-11-12 DIAGNOSIS — K219 Gastro-esophageal reflux disease without esophagitis: Secondary | ICD-10-CM | POA: Diagnosis present

## 2022-11-12 DIAGNOSIS — Z820 Family history of epilepsy and other diseases of the nervous system: Secondary | ICD-10-CM

## 2022-11-12 DIAGNOSIS — Z833 Family history of diabetes mellitus: Secondary | ICD-10-CM

## 2022-11-12 DIAGNOSIS — Z79899 Other long term (current) drug therapy: Secondary | ICD-10-CM

## 2022-11-12 DIAGNOSIS — F17210 Nicotine dependence, cigarettes, uncomplicated: Secondary | ICD-10-CM | POA: Diagnosis present

## 2022-11-12 HISTORY — DX: Cellulitis of face: L03.211

## 2022-11-12 LAB — HEPATIC FUNCTION PANEL
ALT: 49 U/L — ABNORMAL HIGH (ref 0–44)
AST: 47 U/L — ABNORMAL HIGH (ref 15–41)
Albumin: 3.9 g/dL (ref 3.5–5.0)
Alkaline Phosphatase: 67 U/L (ref 38–126)
Bilirubin, Direct: 0.1 mg/dL (ref 0.0–0.2)
Indirect Bilirubin: 0.5 mg/dL (ref 0.3–0.9)
Total Bilirubin: 0.6 mg/dL (ref 0.3–1.2)
Total Protein: 7.1 g/dL (ref 6.5–8.1)

## 2022-11-12 LAB — URINALYSIS, ROUTINE W REFLEX MICROSCOPIC
Bacteria, UA: NONE SEEN
Bilirubin Urine: NEGATIVE
Glucose, UA: NEGATIVE mg/dL
Hgb urine dipstick: NEGATIVE
Ketones, ur: NEGATIVE mg/dL
Nitrite: NEGATIVE
Protein, ur: NEGATIVE mg/dL
Specific Gravity, Urine: 1.027 (ref 1.005–1.030)
pH: 5 (ref 5.0–8.0)

## 2022-11-12 LAB — COMPREHENSIVE METABOLIC PANEL
ALT: 52 U/L — ABNORMAL HIGH (ref 0–44)
AST: 49 U/L — ABNORMAL HIGH (ref 15–41)
Albumin: 4 g/dL (ref 3.5–5.0)
Alkaline Phosphatase: 59 U/L (ref 38–126)
Anion gap: 8 (ref 5–15)
BUN: 9 mg/dL (ref 8–23)
CO2: 25 mmol/L (ref 22–32)
Calcium: 8.9 mg/dL (ref 8.9–10.3)
Chloride: 103 mmol/L (ref 98–111)
Creatinine, Ser: 0.37 mg/dL — ABNORMAL LOW (ref 0.44–1.00)
GFR, Estimated: >60 mL/min (ref >60)
Glucose, Bld: 103 mg/dL — ABNORMAL HIGH (ref 70–99)
Potassium: 3.2 mmol/L — ABNORMAL LOW (ref 3.5–5.1)
Sodium: 136 mmol/L (ref 135–145)
Total Bilirubin: 0.6 mg/dL (ref 0.3–1.2)
Total Protein: 6.8 g/dL (ref 6.5–8.1)

## 2022-11-12 LAB — URINE DRUG SCREEN, QUALITATIVE (ARMC ONLY)
Amphetamines, Ur Screen: NOT DETECTED
Barbiturates, Ur Screen: NOT DETECTED
Benzodiazepine, Ur Scrn: NOT DETECTED
Cannabinoid 50 Ng, Ur ~~LOC~~: NOT DETECTED
Cocaine Metabolite,Ur ~~LOC~~: NOT DETECTED
MDMA (Ecstasy)Ur Screen: NOT DETECTED
Methadone Scn, Ur: NOT DETECTED
Opiate, Ur Screen: NOT DETECTED
Phencyclidine (PCP) Ur S: NOT DETECTED
Tricyclic, Ur Screen: NOT DETECTED

## 2022-11-12 LAB — CBC
HCT: 40.6 % (ref 36.0–46.0)
Hemoglobin: 13.3 g/dL (ref 12.0–15.0)
MCH: 27.3 pg (ref 26.0–34.0)
MCHC: 32.8 g/dL (ref 30.0–36.0)
MCV: 83.2 fL (ref 80.0–100.0)
Platelets: 282 10*3/uL (ref 150–400)
RBC: 4.88 MIL/uL (ref 3.87–5.11)
RDW: 13.8 % (ref 11.5–15.5)
WBC: 8 10*3/uL (ref 4.0–10.5)
nRBC: 0 % (ref 0.0–0.2)

## 2022-11-12 LAB — LACTIC ACID, PLASMA
Lactic Acid, Venous: 0.9 mmol/L (ref 0.5–1.9)
Lactic Acid, Venous: 1.1 mmol/L (ref 0.5–1.9)

## 2022-11-12 LAB — MAGNESIUM: Magnesium: 1.9 mg/dL (ref 1.7–2.4)

## 2022-11-12 LAB — LIPASE, BLOOD: Lipase: 36 U/L (ref 11–51)

## 2022-11-12 LAB — HEMOGLOBIN A1C
Hgb A1c MFr Bld: 5.3 % (ref 4.8–5.6)
Mean Plasma Glucose: 105.41 mg/dL

## 2022-11-12 LAB — ETHANOL: Alcohol, Ethyl (B): <10 mg/dL (ref <10)

## 2022-11-12 MED ORDER — ACETAMINOPHEN 325 MG PO TABS
650.0000 mg | ORAL_TABLET | Freq: Four times a day (QID) | ORAL | Status: DC | PRN
Start: 2022-11-12 — End: 2022-11-14
  Administered 2022-11-14: 650 mg via ORAL
  Filled 2022-11-12: qty 2

## 2022-11-12 MED ORDER — SODIUM CHLORIDE 0.9% FLUSH
3.0000 mL | Freq: Two times a day (BID) | INTRAVENOUS | Status: DC
Start: 2022-11-12 — End: 2022-11-15
  Administered 2022-11-12 – 2022-11-15 (×5): 3 mL via INTRAVENOUS

## 2022-11-12 MED ORDER — IOHEXOL 300 MG/ML  SOLN
75.0000 mL | Freq: Once | INTRAMUSCULAR | Status: AC | PRN
  Administered 2022-11-12: 75 mL via INTRAVENOUS

## 2022-11-12 MED ORDER — NICOTINE 21 MG/24HR TD PT24
21.0000 mg | MEDICATED_PATCH | Freq: Every day | TRANSDERMAL | Status: DC
  Administered 2022-11-12 – 2022-11-15 (×4): 21 mg via TRANSDERMAL
  Filled 2022-11-12 (×4): qty 1

## 2022-11-12 MED ORDER — THIAMINE HCL 100 MG/ML IJ SOLN
100.0000 mg | INTRAMUSCULAR | Status: DC
  Administered 2022-11-12 – 2022-11-14 (×3): 100 mg via INTRAVENOUS
  Filled 2022-11-12 (×3): qty 2

## 2022-11-12 MED ORDER — MORPHINE SULFATE (PF) 4 MG/ML IV SOLN
4.0000 mg | Freq: Once | INTRAVENOUS | Status: AC
  Administered 2022-11-12: 4 mg via INTRAVENOUS
  Filled 2022-11-12: qty 1

## 2022-11-12 MED ORDER — CLINDAMYCIN PHOSPHATE 600 MG/50ML IV SOLN
600.0000 mg | Freq: Once | INTRAVENOUS | Status: AC
  Administered 2022-11-12: 600 mg via INTRAVENOUS
  Filled 2022-11-12: qty 50

## 2022-11-12 MED ORDER — ACETAMINOPHEN 650 MG RE SUPP: 650.0000 mg | Freq: Four times a day (QID) | RECTAL | Status: DC | PRN

## 2022-11-12 MED ORDER — SODIUM CHLORIDE 0.9 % IV SOLN: INTRAVENOUS | Status: DC

## 2022-11-12 MED ORDER — MORPHINE SULFATE (PF) 2 MG/ML IV SOLN
2.0000 mg | INTRAVENOUS | Status: DC | PRN
  Administered 2022-11-12 – 2022-11-13 (×2): 2 mg via INTRAVENOUS
  Filled 2022-11-12 (×2): qty 1

## 2022-11-12 MED ORDER — ONDANSETRON HCL 4 MG/2ML IJ SOLN
4.0000 mg | Freq: Once | INTRAMUSCULAR | Status: AC
  Administered 2022-11-12: 4 mg via INTRAVENOUS
  Filled 2022-11-12: qty 2

## 2022-11-12 MED ORDER — CLINDAMYCIN PHOSPHATE 600 MG/50ML IV SOLN
600.0000 mg | Freq: Three times a day (TID) | INTRAVENOUS | Status: DC
  Administered 2022-11-13 – 2022-11-15 (×7): 600 mg via INTRAVENOUS
  Filled 2022-11-12 (×10): qty 50

## 2022-11-12 MED ORDER — HEPARIN SODIUM (PORCINE) 5000 UNIT/ML IJ SOLN
5000.0000 [IU] | Freq: Three times a day (TID) | INTRAMUSCULAR | Status: DC
Start: 2022-11-12 — End: 2022-11-15
  Administered 2022-11-12 – 2022-11-15 (×8): 5000 [IU] via SUBCUTANEOUS
  Filled 2022-11-12 (×8): qty 1

## 2022-11-12 MED ORDER — FLUTICASONE FUROATE-VILANTEROL 100-25 MCG/ACT IN AEPB
1.0000 | INHALATION_SPRAY | Freq: Every day | RESPIRATORY_TRACT | Status: DC
  Administered 2022-11-13 – 2022-11-15 (×3): 1 via RESPIRATORY_TRACT
  Filled 2022-11-12 (×3): qty 28

## 2022-11-12 NOTE — Assessment & Plan Note (Addendum)
Vitals:   11/12/22 1216 11/12/22 1600 11/12/22 1752 11/12/22 1753  BP: (!) 120/102 (!) 150/88 (!) 142/87 (!) 142/87   11/12/22 1900 11/12/22 1930 11/12/22 2007  BP: 134/80 136/75 134/83  Resume any home meds once available. PRN hydralazine 5 mg > SBP 170.

## 2022-11-12 NOTE — Assessment & Plan Note (Addendum)
Stable. PRN albuterol.  

## 2022-11-12 NOTE — Assessment & Plan Note (Addendum)
IV PPI. 

## 2022-11-12 NOTE — Assessment & Plan Note (Addendum)
Stable no anginal symptoms.  Monitor with telemetry x 2 days.

## 2022-11-12 NOTE — Assessment & Plan Note (Addendum)
UDS is negative.

## 2022-11-12 NOTE — Telephone Encounter (Signed)
  Chief Complaint: swelling in face-pain in tooth Symptoms: swelling in face-tooth pain,redness,heat, sores in mouth Frequency: started yesterday  Disposition: [x] ED /[] Urgent Care (no appt availability in office) / [] Appointment(In office/virtual)/ []  [facility]/ [] Home Care/ [] Refused Recommended Disposition /[] [facility]/ []  Follow-up with PCP Additional Notes: Patient has NP appointment this afternoon- patient is wanting to move appointment up- due to nature of NP appointment I can not move this appointment- advised ED- patient wants to wait. Patient is aware she may be advised ED this afternoon depending on her status. Patient advised limit Tylenol use and try ice pack for discomfort until appointment.

## 2022-11-12 NOTE — Assessment & Plan Note (Addendum)
Will continue patient on clindamycin. A.m. team to seek ENT consult and ID as deemed appropriate. Oral: Chlorhexidine rinses.

## 2022-11-12 NOTE — Assessment & Plan Note (Addendum)
Nicotine patch. Counseling once stable.

## 2022-11-12 NOTE — Assessment & Plan Note (Addendum)
D/d include from fatty liver. Or her h/o etoh use. Level is pending.

## 2022-11-12 NOTE — H&P (Signed)
History and Physical    Patient: Stacy Gilmore:[id number] DOB: [birth year] DOA: 11/12/2022 DOS: the patient was seen and examined on 11/12/2022 PCP: Sherlyn Hay, DO  Patient coming from: Home  Chief Complaint:  Chief Complaint  Patient presents with   Abdominal Pain   Abscess    HPI: Stacy Gilmore is a 62 y.o. female with medical history significant for asthma, COPD, Gastric reflux, and redness and swelling on right jaw for past two days and pt does not report any abdominal pain. Patient did report abdominal pain earlier this morning in the emergency room last month patient had an appendectomy and sigmoid colectomy after diverticular abscess.  Patient has poor dentition. No reports of fevers chills chest pain palpitation.  Patient is in no distress.  In emergency room vitals trend shows: Vitals:   11/12/22 1753 11/12/22 1900 11/12/22 1930 11/12/22 2007  BP: (!) 142/87 134/80 136/75 134/83  Pulse: 71 70 70 79  Temp: 98.2 F (36.8 C)   (!) 97.5 F (36.4 C)  Resp: 18 17 18 20   Height:      Weight:      SpO2: 95% 94% 94% 97%  TempSrc:    Oral  BMI (Calculated):      Labs are notable for : -Metabolic panel shows hypokalemia of 3.2, normal kidney function and normal electrolytes otherwise magnesium 1.9, AST 47 and ALT 49. -CBC is within normal limits. -Urinalysis is clear yellow and small leukocytes , 0-5 WBCs and asymptomatic. -Ct imaging shows: Cellulitis and phlegmonous change in the right submandibular region. No discrete fluid collection visualized. Findings are likely odontogenic in etiology given multiple periapical lucencies along the right mandible.  In the ED pt received: Medications  fluticasone furoate-vilanterol (BREO ELLIPTA) 100-25 MCG/ACT 1 puff (has no administration in time range)  thiamine (VITAMIN B1) injection 100 mg (has no administration in time range)  nicotine (NICODERM CQ - dosed in mg/24 hours) patch 21 mg (has no administration in  time range)  heparin injection 5,000 Units (has no administration in time range)  sodium chloride flush (NS) 0.9 % injection 3 mL (has no administration in time range)  0.9 %  sodium chloride infusion (has no administration in time range)  acetaminophen (TYLENOL) tablet 650 mg (has no administration in time range)    Or  acetaminophen (TYLENOL) suppository 650 mg (has no administration in time range)  morphine (PF) 2 MG/ML injection 2 mg (has no administration in time range)  clindamycin (CLEOCIN) IVPB 600 mg (has no administration in time range)  clindamycin (CLEOCIN) IVPB 600 mg (0 mg Intravenous Stopped 11/12/22 1741)  morphine (PF) 4 MG/ML injection 4 mg (4 mg Intravenous Given 11/12/22 1552)  ondansetron (ZOFRAN) injection 4 mg (4 mg Intravenous Given 11/12/22 1552)  iohexol (OMNIPAQUE) 300 MG/ML solution 75 mL (75 mLs Intravenous Contrast Given 11/12/22 1610)  morphine (PF) 4 MG/ML injection 4 mg (4 mg Intravenous Given 11/12/22 1753)   Review of Systems  HENT:         Mouth swelling and jaw pain.   All other systems reviewed and are negative.  Past Medical History:  Diagnosis Date   Acute diverticulitis 06/19/2022   Asthma    COPD (chronic obstructive pulmonary disease) (HCC)    Diverticulitis of colon 07/25/2022   GERD (gastroesophageal reflux disease)    History of diverticulitis    Past Surgical History:  Procedure Laterality Date   APPENDECTOMY N/A 10/04/2022   Procedure: APPENDECTOMY;  Surgeon: [name], MD;  Location: [facility] ORS;  Service: General;  Laterality: N/A;   CERVICAL SPINE SURGERY     CHOLECYSTECTOMY     COLON RESECTION SIGMOID N/A 10/04/2022   Procedure: COLON RESECTION SIGMOID;  Surgeon: [name], MD;  Location: [facility] ORS;  Service: General;  Laterality: N/A;  Splenic Flexure Takedown   LAPAROTOMY N/A 10/04/2022   Procedure: EXPLORATION LAPAROTOMY;  Surgeon: [name], MD;  Location: [facility] ORS;  Service: General;  Laterality: N/A;    TUBAL LIGATION      reports that she has been smoking cigarettes. She has a 10 pack-year smoking history. She has been exposed to tobacco smoke. She has never used smokeless tobacco. She reports current alcohol use of about 8.0 standard drinks of alcohol per week. She reports that she does not currently use drugs after having used the following drugs: "Crack" cocaine, Cocaine, Methamphetamines, and Marijuana.  Allergies  Allergen Reactions   Bee Venom Swelling   Penicillins Hives and Swelling    Tolerated ceftriaxone   Armoracia Rusticana Ext (Horseradish) Rash    Blisters the mouth   Other Rash    Ragu spaghetti sauce   Family History  Problem Relation Age of Onset   Anxiety disorder Mother    Lung cancer Mother    Thyroid disease Mother    Epilepsy Mother    Other Father        unknown medical history   Breast cancer Sister    Cervical cancer Sister    Epilepsy Brother    Other Maternal Grandmother        unknown medical history   Other Maternal Grandfather        unknown medical history   Diabetes Paternal Grandmother    Other Paternal Grandfather        unknown medical history    Prior to Admission medications   Medication Sig Start Date End Date Taking? Authorizing Provider  albuterol (VENTOLIN HFA) 108 (90 Base) MCG/ACT inhaler Inhale 2 puffs into the lungs every 4 (four) hours as needed for wheezing or shortness of breath. 02/14/22   Tukov-Yual, Alroy Bailiff, NP  fluticasone furoate-vilanterol (BREO ELLIPTA) 100-25 MCG/ACT AEPB Inhale 1 puff into the lungs daily. Rinse and brush thoroughly after administration 02/14/22   Tukov-Yual, Magdalene S, NP  ibuprofen (ADVIL) 600 MG tablet Take 1 tablet (600 mg total) by mouth every 6 (six) hours as needed. 10/24/22   Kandis Cocking, MD  Multiple Vitamin (MULTIVITAMIN WITH MINERALS) TABS tablet Take 1 tablet by mouth daily. Patient not taking: Reported on 10/24/2022 06/27/22   Marcelino Duster, MD  pantoprazole (PROTONIX) 40  MG tablet Take 1 tablet (40 mg total) by mouth daily. 10/24/22 11/23/22  Kandis Cocking, MD  senna-docusate (SENOKOT-S) 8.6-50 MG tablet Take 1 tablet by mouth at bedtime as needed for mild constipation. 06/27/22   Marcelino Duster, MD   Vitals:   11/12/22 1753 11/12/22 1900 11/12/22 1930 11/12/22 2007  BP: (!) 142/87 134/80 136/75 134/83  Pulse: 71 70 70 79  Resp: 18 17 18 20   Temp: 98.2 F (36.8 C)   (!) 97.5 F (36.4 C)  TempSrc:    Oral  SpO2: 95% 94% 94% 97%  Weight:      Height:       Physical Exam Vitals and nursing note reviewed.  Constitutional:      General: She is not in acute distress. HENT:     Head: Normocephalic and atraumatic.     Right Ear: Hearing and external ear  normal.     Left Ear: Hearing and external ear normal.     Nose: Nose normal. No nasal deformity.     Mouth/Throat:     Lips: Pink.     Tongue: No lesions.     Pharynx: Oropharynx is clear.  Eyes:     General: Lids are normal.     Extraocular Movements: Extraocular movements intact.  Neck:      Comments: Pain with opening her mouth.   Cardiovascular:     Rate and Rhythm: Normal rate and regular rhythm.     Heart sounds: Normal heart sounds.  Pulmonary:     Effort: Pulmonary effort is normal.     Breath sounds: Normal breath sounds.  Abdominal:     General: Bowel sounds are normal. There is no distension.     Palpations: Abdomen is soft. There is no mass.     Tenderness: There is no abdominal tenderness.  Musculoskeletal:     Right lower leg: No edema.     Left lower leg: No edema.  Skin:    General: Skin is warm.  Neurological:     General: No focal deficit present.     Mental Status: She is alert and oriented to person, place, and time.     Cranial Nerves: Cranial nerves 2-12 are intact.  Psychiatric:        Attention and Perception: Attention normal.        Mood and Affect: Mood normal.        Speech: Speech normal.        Behavior: Behavior normal. Behavior is cooperative.     Labs on Admission: I have personally reviewed following labs and imaging studies Results for orders placed or performed during the hospital encounter of 11/12/22 (from the past 24 hour(s))  CBC     Status: None   Collection Time: 11/12/22 12:19 PM  Result Value Ref Range   WBC 8.0 4.0 - 10.5 K/uL   RBC 4.88 3.87 - 5.11 MIL/uL   Hemoglobin 13.3 12.0 - 15.0 g/dL   HCT 30.1 60.1 - 09.3 %   MCV 83.2 80.0 - 100.0 fL   MCH 27.3 26.0 - 34.0 pg   MCHC 32.8 30.0 - 36.0 g/dL   RDW 23.5 57.3 - 22.0 %   Platelets 282 150 - 400 K/uL   nRBC 0.0 0.0 - 0.2 %  Comprehensive metabolic panel     Status: Abnormal   Collection Time: 11/12/22 12:19 PM  Result Value Ref Range   Sodium 136 135 - 145 mmol/L   Potassium 3.2 (L) 3.5 - 5.1 mmol/L   Chloride 103 98 - 111 mmol/L   CO2 25 22 - 32 mmol/L   Glucose, Bld 103 (H) 70 - 99 mg/dL   BUN 9 8 - 23 mg/dL   Creatinine, Ser 2.54 (L) 0.44 - 1.00 mg/dL   Calcium 8.9 8.9 - 27.0 mg/dL   Total Protein 6.8 6.5 - 8.1 g/dL   Albumin 4.0 3.5 - 5.0 g/dL   AST 49 (H) 15 - 41 U/L   ALT 52 (H) 0 - 44 U/L   Alkaline Phosphatase 59 38 - 126 U/L   Total Bilirubin 0.6 0.3 - 1.2 mg/dL   GFR, Estimated >62 >37 mL/min   Anion gap 8 5 - 15  Lactic acid, plasma     Status: None   Collection Time: 11/12/22 12:19 PM  Result Value Ref Range   Lactic Acid, Venous 0.9 0.5 - 1.9  mmol/L  Lipase, blood     Status: None   Collection Time: 11/12/22 12:19 PM  Result Value Ref Range   Lipase 36 11 - 51 U/L  Urinalysis, Routine w reflex microscopic -Urine, Clean Catch     Status: Abnormal   Collection Time: 11/12/22 12:19 PM  Result Value Ref Range   Color, Urine YELLOW (A) YELLOW   APPearance CLEAR (A) CLEAR   Specific Gravity, Urine 1.027 1.005 - 1.030   pH 5.0 5.0 - 8.0   Glucose, UA NEGATIVE NEGATIVE mg/dL   Hgb urine dipstick NEGATIVE NEGATIVE   Bilirubin Urine NEGATIVE NEGATIVE   Ketones, ur NEGATIVE NEGATIVE mg/dL   Protein, ur NEGATIVE NEGATIVE mg/dL   Nitrite  NEGATIVE NEGATIVE   Leukocytes,Ua SMALL (A) NEGATIVE   RBC / HPF 0-5 0 - 5 RBC/hpf   WBC, UA 0-5 0 - 5 WBC/hpf   Bacteria, UA NONE SEEN NONE SEEN   Squamous Epithelial / HPF 0-5 0 - 5 /HPF   Mucus PRESENT   Urine Drug Screen, Qualitative ([facility] only)     Status: None   Collection Time: 11/12/22 12:19 PM  Result Value Ref Range   Tricyclic, Ur Screen NONE DETECTED NONE DETECTED   Amphetamines, Ur Screen NONE DETECTED NONE DETECTED   MDMA (Ecstasy)Ur Screen NONE DETECTED NONE DETECTED   Cocaine Metabolite,Ur [place] NONE DETECTED NONE DETECTED   Opiate, Ur Screen NONE DETECTED NONE DETECTED   Phencyclidine (PCP) Ur S NONE DETECTED NONE DETECTED   Cannabinoid 50 Ng, Ur [place] NONE DETECTED NONE DETECTED   Barbiturates, Ur Screen NONE DETECTED NONE DETECTED   Benzodiazepine, Ur Scrn NONE DETECTED NONE DETECTED   Methadone Scn, Ur NONE DETECTED NONE DETECTED  Hepatic function panel     Status: Abnormal   Collection Time: 11/12/22 12:19 PM  Result Value Ref Range   Total Protein 7.1 6.5 - 8.1 g/dL   Albumin 3.9 3.5 - 5.0 g/dL   AST 47 (H) 15 - 41 U/L   ALT 49 (H) 0 - 44 U/L   Alkaline Phosphatase 67 38 - 126 U/L   Total Bilirubin 0.6 0.3 - 1.2 mg/dL   Bilirubin, Direct 0.1 0.0 - 0.2 mg/dL   Indirect Bilirubin 0.5 0.3 - 0.9 mg/dL  Magnesium     Status: None   Collection Time: 11/12/22 12:19 PM  Result Value Ref Range   Magnesium 1.9 1.7 - 2.4 mg/dL    CBC: Recent Labs  Lab 11/12/22 1219  WBC 8.0  HGB 13.3  HCT 40.6  MCV 83.2  PLT 282   Basic Metabolic Panel: Recent Labs  Lab 11/12/22 1219  NA 136  K 3.2*  CL 103  CO2 25  GLUCOSE 103*  BUN 9  CREATININE 0.37*  CALCIUM 8.9  MG 1.9   GFR: Estimated Creatinine Clearance: 70.5 mL/min (A) (by C-G formula based on SCr of 0.37 mg/dL (L)). Liver Function Tests: Recent Labs  Lab 11/12/22 1219  AST 47*  49*  ALT 49*  52*  ALKPHOS 67  59  BILITOT 0.6  0.6  PROT 7.1  6.8  ALBUMIN 3.9  4.0   Recent Labs  Lab  11/12/22 1219  LIPASE 36   No results for input(s): "AMMONIA" in the last 168 hours. Coagulation Profile: No results for input(s): "INR", "PROTIME" in the last 168 hours. Cardiac Enzymes: No results for input(s): "CKTOTAL", "CKMB", "CKMBINDEX", "TROPONINI" in the last 168 hours. BNP (last 3 results) No results for input(s): "PROBNP" in the last 8760  hours. HbA1C: No results for input(s): "HGBA1C" in the last 72 hours. CBG: No results for input(s): "GLUCAP" in the last 168 hours. Lipid Profile: No results for input(s): "CHOL", "HDL", "LDLCALC", "TRIG", "CHOLHDL", "LDLDIRECT" in the last 72 hours. Thyroid Function Tests: No results for input(s): "TSH", "T4TOTAL", "FREET4", "T3FREE", "THYROIDAB" in the last 72 hours. Anemia Panel: No results for input(s): "VITAMINB12", "FOLATE", "FERRITIN", "TIBC", "IRON", "RETICCTPCT" in the last 72 hours. Urinalysis    Component Value Date/Time   COLORURINE YELLOW (A) 11/12/2022 1219   APPEARANCEUR CLEAR (A) 11/12/2022 1219   APPEARANCEUR Cloudy (A) 01/17/2022 1348   LABSPEC 1.027 11/12/2022 1219   LABSPEC 1.009 04/16/2013 0735   PHURINE 5.0 11/12/2022 1219   GLUCOSEU NEGATIVE 11/12/2022 1219   GLUCOSEU Negative 04/16/2013 0735   HGBUR NEGATIVE 11/12/2022 1219   BILIRUBINUR NEGATIVE 11/12/2022 1219   BILIRUBINUR negative 06/19/2022 1225   BILIRUBINUR Negative 01/17/2022 1348   BILIRUBINUR Negative 04/16/2013 0735   KETONESUR NEGATIVE 11/12/2022 1219   PROTEINUR NEGATIVE 11/12/2022 1219   UROBILINOGEN 0.2 06/19/2022 1225   NITRITE NEGATIVE 11/12/2022 1219   LEUKOCYTESUR SMALL (A) 11/12/2022 1219   LEUKOCYTESUR 2+ 04/16/2013 0735   Unresulted Labs (From admission, onward)     Start     Ordered   11/13/22 0500  Comprehensive metabolic panel  Tomorrow morning,   R        11/12/22 1913   11/13/22 0500  CBC  Tomorrow morning,   R        11/12/22 1913   11/12/22 2108  Hemoglobin A1c  Add-on,   AD        11/12/22 2107   11/12/22 1852   Hepatitis panel, acute  Once,   URGENT        11/12/22 1851   11/12/22 1851  MRSA culture  Once,   URGENT        11/12/22 1850   11/12/22 1849  Ethanol  Add-on,   AD        11/12/22 1849   11/12/22 1218  Lactic acid, plasma  (Lactic Acid)  Now then every 2 hours,   STAT      11/12/22 1217           Medications  fluticasone furoate-vilanterol (BREO ELLIPTA) 100-25 MCG/ACT 1 puff (has no administration in time range)  thiamine (VITAMIN B1) injection 100 mg (has no administration in time range)  nicotine (NICODERM CQ - dosed in mg/24 hours) patch 21 mg (has no administration in time range)  heparin injection 5,000 Units (has no administration in time range)  sodium chloride flush (NS) 0.9 % injection 3 mL (has no administration in time range)  0.9 %  sodium chloride infusion (has no administration in time range)  acetaminophen (TYLENOL) tablet 650 mg (has no administration in time range)    Or  acetaminophen (TYLENOL) suppository 650 mg (has no administration in time range)  morphine (PF) 2 MG/ML injection 2 mg (has no administration in time range)  clindamycin (CLEOCIN) IVPB 600 mg (has no administration in time range)  clindamycin (CLEOCIN) IVPB 600 mg (0 mg Intravenous Stopped 11/12/22 1741)  morphine (PF) 4 MG/ML injection 4 mg (4 mg Intravenous Given 11/12/22 1552)  ondansetron (ZOFRAN) injection 4 mg (4 mg Intravenous Given 11/12/22 1552)  iohexol (OMNIPAQUE) 300 MG/ML solution 75 mL (75 mLs Intravenous Contrast Given 11/12/22 1610)  morphine (PF) 4 MG/ML injection 4 mg (4 mg Intravenous Given 11/12/22 1753)   Radiological Exams on Admission: CT Maxillofacial W Contrast  Result Date: 11/12/2022 CLINICAL DATA:  Sublingual/submandibular abscess EXAM: CT MAXILLOFACIAL WITH CONTRAST TECHNIQUE: Multidetector CT imaging of the maxillofacial structures was performed with intravenous contrast. Multiplanar CT image reconstructions were also generated. RADIATION DOSE REDUCTION: This exam  was performed according to the departmental dose-optimization program which includes automated exposure control, adjustment of the mA and/or kV according to patient size and/or use of iterative reconstruction technique. CONTRAST:  75mL OMNIPAQUE IOHEXOL 300 MG/ML  SOLN COMPARISON:  None Available. FINDINGS: Osseous: No fracture or mandibular dislocation. Orbits: Negative. No traumatic or inflammatory finding. Sinuses: Clear. Soft tissues: There is a asymmetric subcutaneous soft tissue stranding in the right Peri and submandibular region with phlegmonous change abutting the right mandible. No fluid collection visualized. There multiple periapical lucencies along the right mandible, including of the right mandibular lateral incisor and premolar. Limited intracranial: No significant or unexpected finding. IMPRESSION: Cellulitis and phlegmonous change in the right submandibular region. No discrete fluid collection visualized. Findings are likely odontogenic in etiology given multiple periapical lucencies along the right mandible. Electronically Signed   By: Lorenza Cambridge M.D.   On: 11/12/2022 18:15    Data Reviewed: Relevant notes from primary care and specialist visits, past discharge summaries as available in EHR, including Care Everywhere. Prior diagnostic testing as pertinent to current admission diagnoses Updated medications and problem lists for reconciliation ED course, including vitals, labs, imaging, treatment and response to treatment. Triage notes, nursing and pharmacy notes and ED provider's notes. Notable results as noted in HPI/ ED Course.   Assessment & Plan Cellulitis of face Will continue patient on clindamycin. A.m. team to seek ENT consult and ID as deemed appropriate. Oral: Chlorhexidine rinses. History of gastroesophageal reflux (GERD) IV PPI. Smoking Nicotine patch. Counseling once stable.   Cocaine use UDS is negative.  Coronary atherosclerosis Stable no anginal symptoms.   Monitor with telemetry x 2 days.  Essential hypertension Vitals:   11/12/22 1216 11/12/22 1600 11/12/22 1752 11/12/22 1753  BP: (!) 120/102 (!) 150/88 (!) 142/87 (!) 142/87   11/12/22 1900 11/12/22 1930 11/12/22 2007  BP: 134/80 136/75 134/83  Resume any home meds once available. PRN hydralazine 5 mg > SBP 170.   Chronic obstructive pulmonary disease (HCC) Stable.  PRN albuterol .  Elevated LFTs D/d include from fatty liver. Or her h/o etoh use. Level is pending. Electrolyte abnormality Pharmacy consult for replacement. Abdominal pain Patient's white count is normal, will obtain CT imaging of the abdomen with contrast as patient is postop and is now having symptoms of abdominal pain with nausea and has history of diverticular abscess.  Urine drug screen  is negative and lactic acid is pending.  DVT prophylaxis:  Heparin  Consults:  None  Advance Care Planning:    Code Status: Full Code  Family Communication:  None  Disposition Plan:  Home  Severity of Illness: The appropriate patient status for this patient is INPATIENT. Inpatient status is judged to be reasonable and necessary in order to provide the required intensity of service to ensure the patient's safety. The patient's presenting symptoms, physical exam findings, and initial radiographic and laboratory data in the context of their chronic comorbidities is felt to place them at high risk for further clinical deterioration. Furthermore, it is not anticipated that the patient will be medically stable for discharge from the hospital within 2 midnights of admission.   * I certify that at the point of admission it is my clinical judgment that the patient will require inpatient hospital care spanning  beyond 2 midnights from the point of admission due to high intensity of service, high risk for further deterioration and high frequency of surveillance required.*  Author: Gertha Calkin, MD 11/12/2022 9:31 PM  For on call review  [email or web address].

## 2022-11-12 NOTE — Telephone Encounter (Signed)
Reason for Disposition  [1] Face is swollen AND [2] fever  Answer Assessment - Initial Assessment Questions 1. LOCATION: "Which tooth is hurting?"  (e.g., right-side/left-side, upper/lower, front/back)     Right lower 2. ONSET: "When did the toothache start?"  (e.g., hours, days)      yesterday 3. SEVERITY: "How bad is the toothache?"  (Scale 1-10; mild, moderate or severe)   - MILD (1-3): doesn't interfere with chewing    - MODERATE (4-7): interferes with chewing, interferes with normal activities, awakens from sleep     - SEVERE (8-10): unable to eat, unable to do any normal activities, excruciating pain        10/10 4. SWELLING: "Is there any visible swelling of your face?"     yes 5. OTHER SYMPTOMS: "Do you have any other symptoms?" (e.g., fever)     Mouth sores in mouth  Protocols used: Toothache-A-AH

## 2022-11-12 NOTE — ED Provider Notes (Signed)
[facility] Provider Note    Event Date/Time   First MD Initiated Contact with Patient 11/12/22 1503     (approximate)  History   Chief Complaint: Abdominal Pain and Abscess  HPI  Stacy Gilmore is a 62 y.o. female with a past medical history of asthma, COPD, gastric reflux, presents to the emergency department for right facial pain swelling and redness.  According to the patient for the last 2 days she has had increased pain redness and swelling of the right face and just below the right jaw.  Patient states it has gotten significantly worse since this morning so she came to the emergency department for evaluation.  Patient denies any fever.  Patient has poor dentition throughout.  Patient also states last month she had abdominal surgery states she has been experiencing some mild discomfort in the abdomen since the surgery although states it has been improving.  Physical Exam   Triage Vital Signs: ED Triage Vitals  Encounter Vitals Group     BP 11/12/22 1216 (!) 120/102     Systolic BP Percentile --      Diastolic BP Percentile --      Pulse Rate 11/12/22 1216 78     Resp 11/12/22 1216 16     Temp 11/12/22 1216 97.7 F (36.5 C)     Temp Source 11/12/22 1216 Oral     SpO2 11/12/22 1216 96 %     Weight 11/12/22 1217 136 lb 14.5 oz (62.1 kg)     Height 11/12/22 1217 5' 6.5" (1.689 m)     Head Circumference --      Peak Flow --      Pain Score 11/12/22 1216 10     Pain Loc --      Pain Education --      Exclude from Growth Chart --     Most recent vital signs: Vitals:   11/12/22 1216 11/12/22 1600  BP: (!) 120/102 (!) 150/88  Pulse: 78 72  Resp: 16 17  Temp: 97.7 F (36.5 C)   SpO2: 96% 98%    General: Awake, no distress.  CV:  Good peripheral perfusion.  Regular rate and rhythm  Resp:  Normal effort.  Equal breath sounds bilaterally.  Abd:  No distention.  Soft No rebound or guarding.  Significant tenderness Other:  Patient has  moderate swelling and tenderness to the right face extending below the mandible on the right side.  Consistent with cellulitis versus abscess versus sialoadenitis   ED Results / Procedures / Treatments    RADIOLOGY  I have reviewed and interpreted CT images.  Patient appears to have significant swelling of this area on the CT scan but I do not appreciate any obvious abscess awaiting radiology read for further evaluation.   MEDICATIONS ORDERED IN ED: Medications  clindamycin (CLEOCIN) IVPB 600 mg (600 mg Intravenous New Bag/Given 11/12/22 1555)  morphine (PF) 4 MG/ML injection 4 mg (4 mg Intravenous Given 11/12/22 1552)  ondansetron (ZOFRAN) injection 4 mg (4 mg Intravenous Given 11/12/22 1552)  iohexol (OMNIPAQUE) 300 MG/ML solution 75 mL (75 mLs Intravenous Contrast Given 11/12/22 1610)     IMPRESSION / MDM / ASSESSMENT AND PLAN / ED COURSE  I reviewed the triage vital signs and the nursing notes.  Patient's presentation is most consistent with acute presentation with potential threat to life or bodily function.  Patient presents the emergency department for right facial pain redness swelling ongoing over the past 2  days but worse today.  Patient's lab work shows a reassuring CBC with a normal white blood cell count, chemistry shows no significant findings.  Lipase normal.  Lactate is negative.  Urinalysis shows no concerning finding.  Given the patient's swelling seen on exam we will start the patient on IV clindamycin.  Awaiting CT results for disposition.  Given the degree of swelling anticipate the patient will likely require admission to the hospital for further treatment.  Not appear to be affecting the airway in any way as of yet.  Patient speaking in full sentences without difficulty.  No difficulty breathing.  CT has resulted showing cellulitis with phlegmonous changes but no abscess.  We will continue IV antibiotics admit to the hospital service for further workup and  treatment.  FINAL CLINICAL IMPRESSION(S) / ED DIAGNOSES   Right facial cellulitis   Note:  This document was prepared using Dragon voice recognition software and may include unintentional dictation errors.   Minna Antis, MD 11/12/22 [phone]

## 2022-11-12 NOTE — Assessment & Plan Note (Addendum)
Patient's white count is normal, will obtain CT imaging of the abdomen with contrast as patient is postop and is now having symptoms of abdominal pain with nausea and has history of diverticular abscess.  Urine drug screen  is negative and lactic acid is pending.

## 2022-11-12 NOTE — Assessment & Plan Note (Addendum)
Pharmacy consult for replacement.

## 2022-11-12 NOTE — ED Triage Notes (Signed)
Pt here with a dental abscess and abd pain. Pt had a colon and appendix reconstruction last month. Pt also right side of face is grossly swollen. Pt states the swelling is now affecting her vision. Pt also states she had a recent UTI. Pt ambulatory to triage.

## 2022-11-13 DIAGNOSIS — L03211 Cellulitis of face: Secondary | ICD-10-CM

## 2022-11-13 LAB — HEPATITIS PANEL, ACUTE
HCV Ab: REACTIVE — AB
Hep A IgM: NONREACTIVE
Hep B C IgM: NONREACTIVE
Hepatitis B Surface Ag: NONREACTIVE

## 2022-11-13 LAB — CBC
HCT: 35.3 % — ABNORMAL LOW (ref 36.0–46.0)
Hemoglobin: 11.6 g/dL — ABNORMAL LOW (ref 12.0–15.0)
MCH: 27.8 pg (ref 26.0–34.0)
MCHC: 32.9 g/dL (ref 30.0–36.0)
MCV: 84.7 fL (ref 80.0–100.0)
Platelets: 220 10*3/uL (ref 150–400)
RBC: 4.17 MIL/uL (ref 3.87–5.11)
RDW: 14.1 % (ref 11.5–15.5)
WBC: 5.2 10*3/uL (ref 4.0–10.5)
nRBC: 0 % (ref 0.0–0.2)

## 2022-11-13 LAB — COMPREHENSIVE METABOLIC PANEL
ALT: 167 U/L — ABNORMAL HIGH (ref 0–44)
AST: 206 U/L — ABNORMAL HIGH (ref 15–41)
Albumin: 3.4 g/dL — ABNORMAL LOW (ref 3.5–5.0)
Alkaline Phosphatase: 69 U/L (ref 38–126)
Anion gap: 8 (ref 5–15)
BUN: 8 mg/dL (ref 8–23)
CO2: 27 mmol/L (ref 22–32)
Calcium: 8.6 mg/dL — ABNORMAL LOW (ref 8.9–10.3)
Chloride: 102 mmol/L (ref 98–111)
Creatinine, Ser: 0.56 mg/dL (ref 0.44–1.00)
GFR, Estimated: >60 mL/min (ref >60)
Glucose, Bld: 91 mg/dL (ref 70–99)
Potassium: 3.2 mmol/L — ABNORMAL LOW (ref 3.5–5.1)
Sodium: 137 mmol/L (ref 135–145)
Total Bilirubin: 0.6 mg/dL (ref 0.3–1.2)
Total Protein: 6.2 g/dL — ABNORMAL LOW (ref 6.5–8.1)

## 2022-11-13 MED ORDER — IBUPROFEN 400 MG PO TABS
400.0000 mg | ORAL_TABLET | Freq: Four times a day (QID) | ORAL | Status: DC | PRN
  Administered 2022-11-13 – 2022-11-14 (×4): 400 mg via ORAL
  Filled 2022-11-13 (×5): qty 1

## 2022-11-13 MED ORDER — ONDANSETRON HCL 4 MG/2ML IJ SOLN
4.0000 mg | Freq: Once | INTRAMUSCULAR | Status: AC
  Administered 2022-11-13: 4 mg via INTRAVENOUS
  Filled 2022-11-13: qty 2

## 2022-11-13 MED ORDER — MORPHINE SULFATE (PF) 2 MG/ML IV SOLN
2.0000 mg | INTRAVENOUS | Status: DC | PRN
  Administered 2022-11-13 – 2022-11-14 (×8): 2 mg via INTRAVENOUS
  Filled 2022-11-13 (×8): qty 1

## 2022-11-13 MED ORDER — PANTOPRAZOLE SODIUM 40 MG PO TBEC
40.0000 mg | DELAYED_RELEASE_TABLET | Freq: Every day | ORAL | Status: DC
  Administered 2022-11-13 – 2022-11-15 (×3): 40 mg via ORAL
  Filled 2022-11-13 (×3): qty 1

## 2022-11-13 MED ORDER — ONDANSETRON HCL 4 MG/2ML IJ SOLN
4.0000 mg | Freq: Four times a day (QID) | INTRAMUSCULAR | Status: DC | PRN
  Administered 2022-11-13 (×3): 4 mg via INTRAVENOUS
  Filled 2022-11-13 (×3): qty 2

## 2022-11-13 MED ORDER — CHLORHEXIDINE GLUCONATE 0.12 % MT SOLN
15.0000 mL | Freq: Two times a day (BID) | OROMUCOSAL | Status: AC
  Administered 2022-11-13 – 2022-11-14 (×4): 15 mL via OROMUCOSAL
  Filled 2022-11-13 (×4): qty 15

## 2022-11-13 MED ORDER — POTASSIUM CHLORIDE CRYS ER 20 MEQ PO TBCR
40.0000 meq | EXTENDED_RELEASE_TABLET | ORAL | Status: AC
  Administered 2022-11-13 (×2): 40 meq via ORAL
  Filled 2022-11-13 (×2): qty 2

## 2022-11-13 NOTE — Plan of Care (Signed)

## 2022-11-13 NOTE — Congregational Nurse Program (Signed)
  Dept: [phone]   Congregational Nurse Program Note  Date of Encounter: 11/12/2022 Late Entry: Client to [facility] day center on 10/29 with swollen right jaw/face. She reports she thinks it is an abscessed tooth. She has an appointment with her PCP this afternoon and does have transportation arranged. She is hoping to be seen earlier. RN discussed being seen at the [facility] ED, client may do that instead. She has been taking acetaminophen for pain. Educated on how much to take. Client reports she was still able to eat, drink and smoke. Madolyn Frieze Past Medical History: Past Medical History:  Diagnosis Date   Acute diverticulitis 06/19/2022   Asthma    COPD (chronic obstructive pulmonary disease) (HCC)    Diverticulitis of colon 07/25/2022   GERD (gastroesophageal reflux disease)    History of diverticulitis     Encounter Details:  Community Questionnaire - 11/12/22 0845       Questionnaire   Ask client: Do you give verbal consent for me to treat you today? Yes    Student Assistance N/A    Location Patient Served  [facility]    Encounter Setting CN site    Population Status Unhoused   client is currenlty staying with her Brother in Education officer, environmental Medicaid   Client now has [place] Complete health Medicaid   Insurance/Financial Assistance Referral N/A    Medication N/A   medications provided through [facility]   Medical Provider Yes    Screening Referrals Made N/A    Medical Referrals Made ED;[facility] PCP/Clinic   [facility] practice   Medical Appointment Completed N/A    CNP Interventions Advocate/Support;Educate    Screenings CN Performed N/A    ED Visit Averted N/A   no ED visit needed   Life-Saving Intervention Made N/A

## 2022-11-13 NOTE — Plan of Care (Signed)
  Problem: Clinical Measurements: Goal: Will remain free from infection Outcome: Progressing Goal: Diagnostic test results will improve Outcome: Progressing   Problem: Activity: Goal: Risk for activity intolerance will decrease Outcome: Progressing   Problem: Nutrition: Goal: Adequate nutrition will be maintained Outcome: Progressing   Problem: Elimination: Goal: Will not experience complications related to bowel motility Outcome: Progressing

## 2022-11-13 NOTE — Progress Notes (Addendum)
PROGRESS NOTE    Stacy Gilmore  QQV:[id number] DOB: Sep 03, 1960 DOA: 11/12/2022 PCP: Stacy Hay, DO   Brief Narrative: 62 year old with past medical history significant for asthma, COPD, vascular reflux, presents with redness and swelling of the right jaw that is started 2 days prior to admission.  Patient mentions some abdominal pain on initial evaluation.  She relates that her abdominal pain has been since she had the surgery, is not getting worse, she has been able to tolerate diet, she denies any worsening abdominal pain. CT Maxillofacial:  Showed cellulitis and phlegmonous changes in the right submandibular region.  No discrete fluid collection visualized.  Findings are likely odontogenic in etiology with multiple periapical lucency along with the right mandible   Assessment & Plan:   Principal Problem:   Cellulitis of face Active Problems:   Abdominal pain   Elevated LFTs   History of gastroesophageal reflux (GERD)   Smoking   Cocaine use   Coronary atherosclerosis   Essential hypertension   Chronic obstructive pulmonary disease (HCC)   Electrolyte abnormality   1-Cellulitis of the face, right submandibular region secondary to odontogenic infection  -CT maxillofacial shows cellulitis and phlegmonous changes in the right submandibular region.  No discrete fluid collection visualized. Continue with IV clindamycin Notices some improvement on swelling.  Denies dysphagia She will need dentist follow-up As needed ibuprofen  2-history of GERD: Continue with PPI  Smoker: Counseling.  On nicotine patch  History of cocaine use: UDS negative CAD: Denies chest pain  Hypertension Is not on medications at home.  Blood pressure has improved , has not required meds.   COPD: Continue as needed albuterol no acute exacerbation Continue with Breo Ellipta Transaminases: History of EtOH. Hepatitis  C+.  Hepatitis C RNA quantitative ordered History of alcohol use.    Abdominal pain:  History of complicated diverticulitis status post bowel perforation abscess formation: Intestine fistula formation, status post colectomy, appendectomy.  10/04/2022 Postsurgery which she relates that pain is better and is not getting worse.  Her abdominal pain has been stable since she had the surgery.  CT was not obtained.  I will hold at this time because she is not having any worsening pain  Hypokalemia: Replace orally     Estimated body mass index is 21.31 kg/m as calculated from the following:   Height as of this encounter: 5' 6.5" (1.689 m).   Weight as of this encounter: 60.8 kg.   DVT prophylaxis: Heparin  Code Status: Full code Family Communication: Care discussed with patient Disposition Plan:  Status is: Inpatient Remains inpatient appropriate because: management of facial  cellulitis.     Consultants:  None  Procedures:  None  Antimicrobials:    Subjective: She is still having facial pain, noted mild improvement of the swelling She reports that abdominal pain has been chronic since her surgery.  Is not getting worse. Objective: Vitals:   11/12/22 2007 11/12/22 2321 11/13/22 0629 11/13/22 0638  BP: 134/83 107/64    Pulse: 79 71    Resp: 20 20    Temp: (!) 97.5 F (36.4 C) 98.1 F (36.7 C)    TempSrc: Oral Oral    SpO2: 97% 95%    Weight:   (P) 60.8 kg 60.8 kg  Height:        Intake/Output Summary (Last 24 hours) at 11/13/2022 0815 Last data filed at 11/13/2022 0605 Gross per 24 hour  Intake 661.96 ml  Output --  Net 661.96 ml   Ceasar Mons  Weights   11/12/22 1217 11/13/22 0629 11/13/22 0638  Weight: 62.1 kg (P) 60.8 kg 60.8 kg    Examination:  General exam: Appears calm and comfortable  Respiratory system: Clear to auscultation. Respiratory effort normal. Cardiovascular system: S1 & S2 heard, RRR. No JVD, murmurs, rubs, gallops or clicks. No pedal edema. Gastrointestinal system: Abdomen is nondistended, soft and nontender.  No organomegaly or masses felt. Normal bowel sounds heard. Central nervous system: Alert and oriented. No focal neurological deficits. Extremities: Symmetric 5 x 5 power. Skin: No rashes, lesions or ulcers Psychiatry: Judgement and insight appear normal. Mood & affect appropriate.     Data Reviewed: I have personally reviewed following labs and imaging studies  CBC: Recent Labs  Lab 11/12/22 1219 11/13/22 0501  WBC 8.0 5.2  HGB 13.3 11.6*  HCT 40.6 35.3*  MCV 83.2 84.7  PLT 282 220   Basic Metabolic Panel: Recent Labs  Lab 11/12/22 1219 11/13/22 0501  NA 136 137  K 3.2* 3.2*  CL 103 102  CO2 25 27  GLUCOSE 103* 91  BUN 9 8  CREATININE 0.37* 0.56  CALCIUM 8.9 8.6*  MG 1.9  --    GFR: Estimated Creatinine Clearance: 70.5 mL/min (by C-G formula based on SCr of 0.56 mg/dL). Liver Function Tests: Recent Labs  Lab 11/12/22 1219 11/13/22 0501  AST 47*  49* 206*  ALT 49*  52* 167*  ALKPHOS 67  59 69  BILITOT 0.6  0.6 0.6  PROT 7.1  6.8 6.2*  ALBUMIN 3.9  4.0 3.4*   Recent Labs  Lab 11/12/22 1219  LIPASE 36   No results for input(s): "AMMONIA" in the last 168 hours. Coagulation Profile: No results for input(s): "INR", "PROTIME" in the last 168 hours. Cardiac Enzymes: No results for input(s): "CKTOTAL", "CKMB", "CKMBINDEX", "TROPONINI" in the last 168 hours. BNP (last 3 results) No results for input(s): "PROBNP" in the last 8760 hours. HbA1C: Recent Labs    11/12/22 1219  HGBA1C 5.3   CBG: No results for input(s): "GLUCAP" in the last 168 hours. Lipid Profile: No results for input(s): "CHOL", "HDL", "LDLCALC", "TRIG", "CHOLHDL", "LDLDIRECT" in the last 72 hours. Thyroid Function Tests: No results for input(s): "TSH", "T4TOTAL", "FREET4", "T3FREE", "THYROIDAB" in the last 72 hours. Anemia Panel: No results for input(s): "VITAMINB12", "FOLATE", "FERRITIN", "TIBC", "IRON", "RETICCTPCT" in the last 72 hours. Sepsis Labs: Recent Labs  Lab  11/12/22 1219 11/12/22 2146  LATICACIDVEN 0.9 1.1    No results found for this or any previous visit (from the past 240 hour(s)).       Radiology Studies: CT Maxillofacial W Contrast  Result Date: 11/12/2022 CLINICAL DATA:  Sublingual/submandibular abscess EXAM: CT MAXILLOFACIAL WITH CONTRAST TECHNIQUE: Multidetector CT imaging of the maxillofacial structures was performed with intravenous contrast. Multiplanar CT image reconstructions were also generated. RADIATION DOSE REDUCTION: This exam was performed according to the departmental dose-optimization program which includes automated exposure control, adjustment of the mA and/or kV according to patient size and/or use of iterative reconstruction technique. CONTRAST:  75mL OMNIPAQUE IOHEXOL 300 MG/ML  SOLN COMPARISON:  None Available. FINDINGS: Osseous: No fracture or mandibular dislocation. Orbits: Negative. No traumatic or inflammatory finding. Sinuses: Clear. Soft tissues: There is a asymmetric subcutaneous soft tissue stranding in the right Peri and submandibular region with phlegmonous change abutting the right mandible. No fluid collection visualized. There multiple periapical lucencies along the right mandible, including of the right mandibular lateral incisor and premolar. Limited intracranial: No significant or unexpected finding. IMPRESSION: Cellulitis  and phlegmonous change in the right submandibular region. No discrete fluid collection visualized. Findings are likely odontogenic in etiology given multiple periapical lucencies along the right mandible. Electronically Signed   By: Lorenza Cambridge M.D.   On: 11/12/2022 18:15        Scheduled Meds:  chlorhexidine  15 mL Mouth/Throat BID   fluticasone furoate-vilanterol  1 puff Inhalation Daily   heparin  5,000 Units Subcutaneous Q8H   nicotine  21 mg Transdermal Daily   sodium chloride flush  3 mL Intravenous Q12H   thiamine (VITAMIN B1) injection  100 mg Intravenous Q24H    Continuous Infusions:  sodium chloride 75 mL/hr at 11/12/22 2155   clindamycin (CLEOCIN) IV 600 mg (11/13/22 0605)     LOS: 1 day    Time spent: 35 minutes    Tamiah Dysart A Fraya Ueda, MD [facility]   If 7PM-7AM, please contact night-coverage [email or web address]  11/13/2022, 8:15 AM

## 2022-11-14 DIAGNOSIS — L03211 Cellulitis of face: Secondary | ICD-10-CM | POA: Diagnosis not present

## 2022-11-14 LAB — HEPATIC FUNCTION PANEL
ALT: 139 U/L — ABNORMAL HIGH (ref 0–44)
AST: 94 U/L — ABNORMAL HIGH (ref 15–41)
Albumin: 3.6 g/dL (ref 3.5–5.0)
Alkaline Phosphatase: 68 U/L (ref 38–126)
Bilirubin, Direct: <0.1 mg/dL (ref 0.0–0.2)
Total Bilirubin: 0.3 mg/dL (ref 0.3–1.2)
Total Protein: 6.4 g/dL — ABNORMAL LOW (ref 6.5–8.1)

## 2022-11-14 LAB — HCV RNA QUANT
HCV Quantitative Log: 6.1 {Log} (ref >1.70)
HCV Quantitative: 1260000 [IU]/mL (ref >50)

## 2022-11-14 LAB — BASIC METABOLIC PANEL
Anion gap: 8 (ref 5–15)
BUN: 7 mg/dL — ABNORMAL LOW (ref 8–23)
CO2: 27 mmol/L (ref 22–32)
Calcium: 8.8 mg/dL — ABNORMAL LOW (ref 8.9–10.3)
Chloride: 102 mmol/L (ref 98–111)
Creatinine, Ser: 0.42 mg/dL — ABNORMAL LOW (ref 0.44–1.00)
GFR, Estimated: >60 mL/min (ref >60)
Glucose, Bld: 105 mg/dL — ABNORMAL HIGH (ref 70–99)
Potassium: 4.5 mmol/L (ref 3.5–5.1)
Sodium: 137 mmol/L (ref 135–145)

## 2022-11-14 MED ORDER — TRAMADOL HCL 50 MG PO TABS
50.0000 mg | ORAL_TABLET | Freq: Four times a day (QID) | ORAL | Status: DC | PRN
  Administered 2022-11-14 – 2022-11-15 (×2): 50 mg via ORAL
  Filled 2022-11-14 (×2): qty 1

## 2022-11-14 MED ORDER — ACETAMINOPHEN 325 MG PO TABS
650.0000 mg | ORAL_TABLET | Freq: Four times a day (QID) | ORAL | Status: DC | PRN
  Administered 2022-11-15: 650 mg via ORAL
  Filled 2022-11-14: qty 2

## 2022-11-14 MED ORDER — MORPHINE SULFATE (PF) 2 MG/ML IV SOLN
1.0000 mg | INTRAVENOUS | Status: DC | PRN
  Administered 2022-11-14 – 2022-11-15 (×2): 1 mg via INTRAVENOUS
  Filled 2022-11-14 (×2): qty 1

## 2022-11-14 MED ORDER — CALCIUM CARBONATE ANTACID 500 MG PO CHEW
1.0000 | CHEWABLE_TABLET | Freq: Three times a day (TID) | ORAL | Status: DC | PRN
  Administered 2022-11-14: 200 mg via ORAL
  Filled 2022-11-14: qty 1

## 2022-11-14 MED ORDER — ACETAMINOPHEN 650 MG RE SUPP
650.0000 mg | Freq: Four times a day (QID) | RECTAL | Status: DC | PRN
Start: 2022-11-14 — End: 2022-11-15

## 2022-11-14 MED ORDER — CHLORHEXIDINE GLUCONATE 0.12 % MT SOLN
15.0000 mL | Freq: Once | OROMUCOSAL | Status: AC
  Administered 2022-11-14: 15 mL via OROMUCOSAL
  Filled 2022-11-14: qty 15

## 2022-11-14 NOTE — Progress Notes (Signed)
I have reviewed and concur with this student's documentation.   Allyn Kenner, RN 11/14/2022 12:18 PM

## 2022-11-14 NOTE — Progress Notes (Addendum)
PROGRESS NOTE    Stacy Gilmore  QMV:[id number] DOB: 1960/09/17 DOA: 11/12/2022 PCP: Sherlyn Hay, DO   Brief Narrative: 62 year old with past medical history significant for asthma, COPD, vascular reflux, presents with redness and swelling of the right jaw that is started 2 days prior to admission.  Patient mentions some abdominal pain on initial evaluation.  She relates that her abdominal pain has been since she had the surgery, is not getting worse, she has been able to tolerate diet, she denies any worsening abdominal pain. CT Maxillofacial:  Showed cellulitis and phlegmonous changes in the right submandibular region.  No discrete fluid collection visualized.  Findings are likely odontogenic in etiology with multiple periapical lucency along with the right mandible   Assessment & Plan:   Principal Problem:   Cellulitis of face Active Problems:   Abdominal pain   Elevated LFTs   History of gastroesophageal reflux (GERD)   Smoking   Cocaine use   Coronary atherosclerosis   Essential hypertension   Chronic obstructive pulmonary disease (HCC)   Electrolyte abnormality   1-Cellulitis of the face, Right submandibular region secondary to odontogenic infection  -CT maxillofacial shows cellulitis and phlegmonous changes in the right submandibular region.  No discrete fluid collection visualized. Continue with IV clindamycin Swelling improving.  She will need dentist follow-up As needed ibuprofen If continue to improve plan to discharge tomorrow.   2-history of GERD: Continue with PPI  Smoker: Counseling.  On nicotine patch  History of cocaine use: UDS negative CAD: Denies chest pain  Hypertension Is not on medications at home.  Blood pressure has improved , has not required meds.   COPD: Continue as needed albuterol no acute exacerbation Continue with Breo Ellipta Transaminases: History of EtOH. Hepatitis  C+.  Hepatitis C RNA quantitative : 1,260,000. Quat log more  than 6.1 She will need referral to ID clinic. Appointment with Dr. Rivka Safer on 12/19/22 at 11AM  History of alcohol use.  LFT trending down.   Abdominal pain:  History of complicated diverticulitis status post bowel perforation abscess formation: Intestine fistula formation, status post colectomy, appendectomy.  10/04/2022 Postsurgery which she relates that pain is better and is not getting worse.  Her abdominal pain has been stable since she had the surgery.  CT was not obtained.  I will hold at this time because she is not having any worsening pain  Hypokalemia: Replaced orally     Estimated body mass index is 21.03 kg/m as calculated from the following:   Height as of this encounter: 5' 6.5" (1.689 m).   Weight as of this encounter: 60 kg.   DVT prophylaxis: Heparin  Code Status: Full code Family Communication: Care discussed with patient Disposition Plan:  Status is: Inpatient Remains inpatient appropriate because: management of facial  cellulitis.     Consultants:  None  Procedures:  None  Antimicrobials:    Subjective: Swelling improving. Pain better.  Denies abdominal pain  Objective: Vitals:   11/13/22 2333 11/14/22 0500 11/14/22 0751 11/14/22 0753  BP: 119/82  121/81 121/81  Pulse: 61  60 60  Resp: 16     Temp: 97.9 F (36.6 C)  97.7 F (36.5 C) 97.7 F (36.5 C)  TempSrc:   Oral Oral  SpO2: 98%  100% 98%  Weight:  60 kg    Height:        Intake/Output Summary (Last 24 hours) at 11/14/2022 0919 Last data filed at 11/13/2022 1900 Gross per 24 hour  Intake 1385.11  ml  Output --  Net 1385.11 ml   Filed Weights   11/13/22 0629 11/13/22 0638 11/14/22 0500  Weight: 60.8 kg 60.8 kg 60 kg    Examination:  General exam: NAD Respiratory system: CTA Cardiovascular system: S 1, S 2 RRR Gastrointestinal system: BS present, soft, nt Central nervous system: Alert Facial swelling improved.   Data Reviewed: I have personally reviewed following  labs and imaging studies  CBC: Recent Labs  Lab 11/12/22 1219 11/13/22 0501  WBC 8.0 5.2  HGB 13.3 11.6*  HCT 40.6 35.3*  MCV 83.2 84.7  PLT 282 220   Basic Metabolic Panel: Recent Labs  Lab 11/12/22 1219 11/13/22 0501 11/14/22 0812  NA 136 137 137  K 3.2* 3.2* 4.5  CL 103 102 102  CO2 25 27 27   GLUCOSE 103* 91 105*  BUN 9 8 7*  CREATININE 0.37* 0.56 0.42*  CALCIUM 8.9 8.6* 8.8*  MG 1.9  --   --    GFR: Estimated Creatinine Clearance: 69.9 mL/min (A) (by C-G formula based on SCr of 0.42 mg/dL (L)). Liver Function Tests: Recent Labs  Lab 11/12/22 1219 11/13/22 0501  AST 47*  49* 206*  ALT 49*  52* 167*  ALKPHOS 67  59 69  BILITOT 0.6  0.6 0.6  PROT 7.1  6.8 6.2*  ALBUMIN 3.9  4.0 3.4*   Recent Labs  Lab 11/12/22 1219  LIPASE 36   No results for input(s): "AMMONIA" in the last 168 hours. Coagulation Profile: No results for input(s): "INR", "PROTIME" in the last 168 hours. Cardiac Enzymes: No results for input(s): "CKTOTAL", "CKMB", "CKMBINDEX", "TROPONINI" in the last 168 hours. BNP (last 3 results) No results for input(s): "PROBNP" in the last 8760 hours. HbA1C: Recent Labs    11/12/22 1219  HGBA1C 5.3   CBG: No results for input(s): "GLUCAP" in the last 168 hours. Lipid Profile: No results for input(s): "CHOL", "HDL", "LDLCALC", "TRIG", "CHOLHDL", "LDLDIRECT" in the last 72 hours. Thyroid Function Tests: No results for input(s): "TSH", "T4TOTAL", "FREET4", "T3FREE", "THYROIDAB" in the last 72 hours. Anemia Panel: No results for input(s): "VITAMINB12", "FOLATE", "FERRITIN", "TIBC", "IRON", "RETICCTPCT" in the last 72 hours. Sepsis Labs: Recent Labs  Lab 11/12/22 1219 11/12/22 2146  LATICACIDVEN 0.9 1.1    No results found for this or any previous visit (from the past 240 hour(s)).       Radiology Studies: CT Maxillofacial W Contrast  Result Date: 11/12/2022 CLINICAL DATA:  Sublingual/submandibular abscess EXAM: CT  MAXILLOFACIAL WITH CONTRAST TECHNIQUE: Multidetector CT imaging of the maxillofacial structures was performed with intravenous contrast. Multiplanar CT image reconstructions were also generated. RADIATION DOSE REDUCTION: This exam was performed according to the departmental dose-optimization program which includes automated exposure control, adjustment of the mA and/or kV according to patient size and/or use of iterative reconstruction technique. CONTRAST:  75mL OMNIPAQUE IOHEXOL 300 MG/ML  SOLN COMPARISON:  None Available. FINDINGS: Osseous: No fracture or mandibular dislocation. Orbits: Negative. No traumatic or inflammatory finding. Sinuses: Clear. Soft tissues: There is a asymmetric subcutaneous soft tissue stranding in the right Peri and submandibular region with phlegmonous change abutting the right mandible. No fluid collection visualized. There multiple periapical lucencies along the right mandible, including of the right mandibular lateral incisor and premolar. Limited intracranial: No significant or unexpected finding. IMPRESSION: Cellulitis and phlegmonous change in the right submandibular region. No discrete fluid collection visualized. Findings are likely odontogenic in etiology given multiple periapical lucencies along the right mandible. Electronically Signed   By:  Lorenza Cambridge M.D.   On: 11/12/2022 18:15        Scheduled Meds:  chlorhexidine  15 mL Mouth/Throat BID   fluticasone furoate-vilanterol  1 puff Inhalation Daily   heparin  5,000 Units Subcutaneous Q8H   nicotine  21 mg Transdermal Daily   pantoprazole  40 mg Oral Daily   sodium chloride flush  3 mL Intravenous Q12H   thiamine (VITAMIN B1) injection  100 mg Intravenous Q24H   Continuous Infusions:  clindamycin (CLEOCIN) IV 600 mg (11/14/22 0604)     LOS: 2 days    Time spent: 35 minutes    Minahil Quinlivan A Gianluca Chhim, MD [facility]   If 7PM-7AM, please contact night-coverage [email or web address]  11/14/2022, 9:19 AM

## 2022-11-14 NOTE — Progress Notes (Signed)
CM spoke with patient, who is IADL's, has transport home. CM provided patient with a list of Medicaid Dental practices in [place] No other needs at this time.

## 2022-11-14 NOTE — Plan of Care (Signed)
  Problem: Education: Goal: Knowledge of General Education information will improve Description: Including pain rating scale, medication(s)/side effects and non-pharmacologic comfort measures 11/14/2022 0154 by Thayer Jew, RN Outcome: Progressing 11/14/2022 0152 by Thayer Jew, RN Outcome: Progressing   Problem: Health Behavior/Discharge Planning: Goal: Ability to manage health-related needs will improve 11/14/2022 0154 by Thayer Jew, RN Outcome: Progressing 11/14/2022 0152 by Thayer Jew, RN Outcome: Progressing   Problem: Clinical Measurements: Goal: Ability to maintain clinical measurements within normal limits will improve 11/14/2022 0154 by Thayer Jew, RN Outcome: Progressing 11/14/2022 0152 by Thayer Jew, RN Outcome: Progressing Goal: Will remain free from infection 11/14/2022 0154 by Thayer Jew, RN Outcome: Progressing 11/14/2022 0152 by Thayer Jew, RN Outcome: Progressing Goal: Diagnostic test results will improve 11/14/2022 0154 by Thayer Jew, RN Outcome: Progressing 11/14/2022 0152 by Thayer Jew, RN Outcome: Progressing Goal: Respiratory complications will improve 11/14/2022 0154 by Thayer Jew, RN Outcome: Progressing 11/14/2022 0152 by Thayer Jew, RN Outcome: Progressing Goal: Cardiovascular complication will be avoided 11/14/2022 0154 by Thayer Jew, RN Outcome: Progressing 11/14/2022 0152 by Thayer Jew, RN Outcome: Progressing   Problem: Activity: Goal: Risk for activity intolerance will decrease 11/14/2022 0154 by Thayer Jew, RN Outcome: Progressing 11/14/2022 0152 by Thayer Jew, RN Outcome: Progressing   Problem: Nutrition: Goal: Adequate nutrition will be maintained 11/14/2022 0154 by Thayer Jew, RN Outcome: Progressing 11/14/2022 0152 by Thayer Jew, RN Outcome: Progressing   Problem: Coping: Goal: Level of anxiety will decrease 11/14/2022 0154 by Thayer Jew, RN Outcome: Progressing 11/14/2022 0152 by Thayer Jew, RN Outcome: Progressing   Problem: Elimination: Goal: Will not experience complications related to bowel motility 11/14/2022 0154 by Thayer Jew, RN Outcome: Progressing 11/14/2022 0152 by Thayer Jew, RN Outcome: Progressing Goal: Will not experience complications related to urinary retention 11/14/2022 0154 by Thayer Jew, RN Outcome: Progressing 11/14/2022 0152 by Thayer Jew, RN Outcome: Progressing   Problem: Pain Management: Goal: General experience of comfort will improve 11/14/2022 0154 by Thayer Jew, RN Outcome: Progressing 11/14/2022 0152 by Thayer Jew, RN Outcome: Progressing   Problem: Safety: Goal: Ability to remain free from injury will improve 11/14/2022 0154 by Thayer Jew, RN Outcome: Progressing 11/14/2022 0152 by Thayer Jew, RN Outcome: Progressing   Problem: Skin Integrity: Goal: Risk for impaired skin integrity will decrease 11/14/2022 0154 by Thayer Jew, RN Outcome: Progressing 11/14/2022 0152 by Thayer Jew, RN Outcome: Progressing

## 2022-11-14 NOTE — Plan of Care (Signed)

## 2022-11-14 NOTE — Plan of Care (Signed)
  Problem: Activity: Goal: Risk for activity intolerance will decrease Outcome: Progressing   Problem: Nutrition: Goal: Adequate nutrition will be maintained Outcome: Progressing   Problem: Coping: Goal: Level of anxiety will decrease Outcome: Progressing   Problem: Elimination: Goal: Will not experience complications related to bowel motility Outcome: Progressing Goal: Will not experience complications related to urinary retention Outcome: Progressing   Problem: Pain Management: Goal: General experience of comfort will improve Outcome: Not Progressing   Problem: Safety: Goal: Ability to remain free from injury will improve Outcome: Progressing   Problem: Skin Integrity: Goal: Risk for impaired skin integrity will decrease Outcome: Progressing

## 2022-11-15 ENCOUNTER — Encounter: Payer: Self-pay | Admitting: Internal Medicine

## 2022-11-15 ENCOUNTER — Other Ambulatory Visit: Payer: Self-pay

## 2022-11-15 DIAGNOSIS — L03211 Cellulitis of face: Secondary | ICD-10-CM | POA: Diagnosis not present

## 2022-11-15 MED ORDER — TRAMADOL HCL 50 MG PO TABS
50.0000 mg | ORAL_TABLET | Freq: Four times a day (QID) | ORAL | 0 refills | Status: AC | PRN
  Filled 2022-11-15: qty 12, 3d supply, fill #0

## 2022-11-15 MED ORDER — CLINDAMYCIN HCL 300 MG PO CAPS
300.0000 mg | ORAL_CAPSULE | Freq: Three times a day (TID) | ORAL | 0 refills | Status: AC
Start: 2022-11-15 — End: 2022-11-22
  Filled 2022-11-15: qty 21, 7d supply, fill #0

## 2022-11-15 NOTE — Plan of Care (Signed)

## 2022-11-15 NOTE — Discharge Summary (Signed)
Physician Discharge Summary   Patient: Stacy Gilmore MRN: [id number] DOB: 1960-01-31  Admit date:     11/12/2022  Discharge date: 11/15/22  Discharge Physician: Alba Cory   PCP: Sherlyn Hay, DO   Recommendations at discharge:   Needs follow up with Dentist for odontogenic infection Needs follow up with ID for treatment of Hepatitis C>  Needs LFT in 1 week.   Discharge Diagnoses: Principal Problem:   Cellulitis of face Active Problems:   Abdominal pain   Elevated LFTs   History of gastroesophageal reflux (GERD)   Smoking   Cocaine use   Coronary atherosclerosis   Essential hypertension   Chronic obstructive pulmonary disease (HCC)   Electrolyte abnormality  Resolved Problems:   * No resolved hospital problems. *  Hospital Course: 62 year old with past medical history significant for asthma, COPD, vascular reflux, presents with redness and swelling of the right jaw that is started 2 days prior to admission. Patient mentions some abdominal pain on initial evaluation. She relates that her abdominal pain has been since she had the surgery, is not getting worse, she has been able to tolerate diet, she denies any worsening abdominal pain. CT Maxillofacial: Showed cellulitis and phlegmonous changes in the right submandibular region. No discrete fluid collection visualized. Findings are likely odontogenic in etiology with multiple periapical lucency along with the right mandible    Assessment and Plan: 1-Cellulitis of the face, Right submandibular region secondary to odontogenic infection  -CT maxillofacial shows cellulitis and phlegmonous changes in the right submandibular region.  No discrete fluid collection visualized. Continue with IV clindamycin Swelling improving.  She will need dentist follow-up As needed ibuprofen Improved, discharge on Clindamycin for 7 days. Close follow up with dentist    2-history of GERD: Continue with PPI   Smoker: Counseling.  On  nicotine patch   History of cocaine use: UDS negative CAD: Denies chest pain   Hypertension Is not on medications at home.  Blood pressure has improved , has not required meds.    COPD: Continue as needed albuterol no acute exacerbation Continue with Breo Ellipta  Transaminases: History of EtOH. Hepatitis  C+.  Hepatitis C RNA quantitative : 1,260,000. Quat log more than 6.1 She will need referral to ID clinic. Appointment with Dr. Rivka Safer on 12/19/22 at 11AM  History of alcohol use.  LFT trending down.    Abdominal pain:  History of complicated diverticulitis status post bowel perforation abscess formation: Intestine fistula formation, status post colectomy, appendectomy.  10/04/2022 Postsurgery which she relates that pain is better and is not getting worse.  Her abdominal pain has been stable since she had the surgery.  CT was not obtained.  I will hold at this time because she is not having any worsening pain   Hypokalemia: Replaced orally                Consultants; none Procedures performed: none Disposition: Home Diet recommendation:  Discharge Diet Orders (From admission, onward)     Start     Ordered   11/15/22 0000  Diet - low sodium heart healthy        11/15/22 0941           Cardiac diet DISCHARGE MEDICATION: Allergies as of 11/15/2022       Reactions   Bee Venom Swelling   Penicillins Hives, Swelling   Tolerated ceftriaxone   Armoracia Rusticana Ext (horseradish) Rash   Blisters the mouth   Other Rash   Ragu  spaghetti sauce        Medication List     TAKE these medications    Breo Ellipta 100-25 MCG/ACT Aepb Generic drug: fluticasone furoate-vilanterol Inhale 1 puff into the lungs daily. Rinse and brush thoroughly after administration   clindamycin 300 MG capsule Commonly known as: CLEOCIN Take 1 capsule (300 mg total) by mouth 3 (three) times daily for 7 days.   ibuprofen 600 MG tablet Commonly known as: ADVIL Take 1 tablet  (600 mg total) by mouth every 6 (six) hours as needed.   multivitamin with minerals Tabs tablet Take 1 tablet by mouth daily.   pantoprazole 40 MG tablet Commonly known as: PROTONIX Take 1 tablet (40 mg total) by mouth daily.   senna-docusate 8.6-50 MG tablet Commonly known as: Senokot-S Take 1 tablet by mouth at bedtime as needed for mild constipation.   traMADol 50 MG tablet Commonly known as: ULTRAM Take 1 tablet (50 mg total) by mouth every 6 (six) hours as needed for up to 3 days for moderate pain (pain score 4-6) (Moderate pain not relieved by ibuprofen).   Ventolin HFA 108 (90 Base) MCG/ACT inhaler Generic drug: albuterol Inhale 2 puffs into the lungs every 4 (four) hours as needed for wheezing or shortness of breath.        Follow-up Information     Sherlyn Hay, DO Follow up in 1 week(s).   Specialty: Family Medicine Contact information: [address] [place] [address] [phone]                Discharge Exam: Ceasar Mons Weights   11/13/22 2440 11/14/22 0500 11/15/22 0500  Weight: 60.8 kg 60 kg 60 kg   General; NAD  Condition at discharge: stable  The results of significant diagnostics from this hospitalization (including imaging, microbiology, ancillary and laboratory) are listed below for reference.   Imaging Studies: CT Maxillofacial W Contrast  Result Date: 11/12/2022 CLINICAL DATA:  Sublingual/submandibular abscess EXAM: CT MAXILLOFACIAL WITH CONTRAST TECHNIQUE: Multidetector CT imaging of the maxillofacial structures was performed with intravenous contrast. Multiplanar CT image reconstructions were also generated. RADIATION DOSE REDUCTION: This exam was performed according to the departmental dose-optimization program which includes automated exposure control, adjustment of the mA and/or kV according to patient size and/or use of iterative reconstruction technique. CONTRAST:  75mL OMNIPAQUE IOHEXOL 300 MG/ML  SOLN COMPARISON:   None Available. FINDINGS: Osseous: No fracture or mandibular dislocation. Orbits: Negative. No traumatic or inflammatory finding. Sinuses: Clear. Soft tissues: There is a asymmetric subcutaneous soft tissue stranding in the right Peri and submandibular region with phlegmonous change abutting the right mandible. No fluid collection visualized. There multiple periapical lucencies along the right mandible, including of the right mandibular lateral incisor and premolar. Limited intracranial: No significant or unexpected finding. IMPRESSION: Cellulitis and phlegmonous change in the right submandibular region. No discrete fluid collection visualized. Findings are likely odontogenic in etiology given multiple periapical lucencies along the right mandible. Electronically Signed   By: Lorenza Cambridge M.D.   On: 11/12/2022 18:15    Microbiology: Results for orders placed or performed during the hospital encounter of 09/28/22  Blood culture (routine x 2)     Status: None   Collection Time: 09/28/22  9:55 AM   Specimen: BLOOD  Result Value Ref Range Status   Specimen Description BLOOD BLOOD RIGHT FOREARM  Final   Special Requests   Final    BOTTLES DRAWN AEROBIC AND ANAEROBIC Blood Culture adequate volume   Culture  Final    NO GROWTH 5 DAYS Performed at [facility], [address] [place], [place] [address]    Report Status 10/03/2022 FINAL  Final  Blood culture (routine x 2)     Status: None   Collection Time: 09/28/22 10:18 AM   Specimen: BLOOD  Result Value Ref Range Status   Specimen Description BLOOD Blood Culture adequate volume  Final   Special Requests NONE  Final   Culture   Final    NO GROWTH 5 DAYS Performed at [facility], [address] [place], [place] [address]    Report Status 10/03/2022 FINAL  Final    Labs: CBC: Recent Labs  Lab 11/12/22 1219 11/13/22 0501  WBC 8.0 5.2  HGB 13.3 11.6*  HCT 40.6 35.3*  MCV 83.2 84.7  PLT 282 220   Basic Metabolic  Panel: Recent Labs  Lab 11/12/22 1219 11/13/22 0501 11/14/22 0812  NA 136 137 137  K 3.2* 3.2* 4.5  CL 103 102 102  CO2 25 27 27   GLUCOSE 103* 91 105*  BUN 9 8 7*  CREATININE 0.37* 0.56 0.42*  CALCIUM 8.9 8.6* 8.8*  MG 1.9  --   --    Liver Function Tests: Recent Labs  Lab 11/12/22 1219 11/13/22 0501 11/14/22 0938  AST 47*  49* 206* 94*  ALT 49*  52* 167* 139*  ALKPHOS 67  59 69 68  BILITOT 0.6  0.6 0.6 0.3  PROT 7.1  6.8 6.2* 6.4*  ALBUMIN 3.9  4.0 3.4* 3.6   CBG: No results for input(s): "GLUCAP" in the last 168 hours.  Discharge time spent: greater than 30 minutes.  Signed: Alba Cory, MD [facility] 11/15/2022

## 2022-11-21 ENCOUNTER — Encounter: Payer: Self-pay | Admitting: Surgery

## 2022-11-21 ENCOUNTER — Ambulatory Visit: Payer: Medicaid Other | Admitting: Surgery

## 2022-11-21 VITALS — BP 140/80 | HR 63 | Temp 98.0°F | Ht 66.5 in | Wt 141.0 lb

## 2022-11-21 DIAGNOSIS — Z09 Encounter for follow-up examination after completed treatment for conditions other than malignant neoplasm: Secondary | ICD-10-CM

## 2022-11-21 DIAGNOSIS — K5720 Diverticulitis of large intestine with perforation and abscess without bleeding: Secondary | ICD-10-CM

## 2022-11-21 DIAGNOSIS — Z9049 Acquired absence of other specified parts of digestive tract: Secondary | ICD-10-CM | POA: Insufficient documentation

## 2022-11-21 NOTE — Patient Instructions (Signed)
   Follow-up with our office as needed.  Please call and ask to speak with a nurse if you develop questions or concerns.  

## 2022-11-21 NOTE — Progress Notes (Signed)
[facility] POST-OP OFFICE VISIT  11/21/2022  HPI: Stacy Gilmore is a 62 y.o. female for 2nd post-op visit, now 48 days s/p open sigmoid colectomy.  No complaints.  BM s still "mushy"  4-5 movements in the morning.    Vital signs: BP (!) 140/80   Pulse 63   Temp 98 F (36.7 C)   Ht 5' 6.5" (1.689 m)   Wt 141 lb (64 kg)   SpO2 97%   BMI 22.42 kg/m    Physical Exam: Constitutional: She appears quite well and active. Abdomen: Soft and nontender.  Her overlying skin incision appears to be multiple and separate from any underlying fascial component.  There is no fascial mass or deformity present. Skin: Incision well-healed.  Assessment/Plan: This is a 62 y.o. female 48 days s/p sigmoid resection for severe degree of diverticulitis.  Doing well.  Patient Active Problem List   Diagnosis Date Noted   Cellulitis of face 11/12/2022   Elevated LFTs 11/12/2022   Electrolyte abnormality 11/12/2022   Hypokalemia 10/11/2022   Diverticulitis large intestine 09/28/2022   Gastroesophageal reflux disease without esophagitis 06/27/2022   Essential hypertension 06/27/2022   Protein-calorie malnutrition, moderate (HCC) 06/27/2022   Malnutrition of moderate degree 06/24/2022   Dysuria 06/19/2022   Abdominal pain 06/19/2022   Diverticulitis of intestine with abscess, recurrent 06/19/2022   Coronary atherosclerosis 02/07/2022   Aortic atherosclerosis (HCC) 02/07/2022   Diarrhea 02/06/2022   Cocaine use 02/06/2022   Generalized joint pain 01/17/2022   Health care maintenance 01/17/2022   Encounter to establish care 12/11/2021   History of asthma 12/11/2021   History of gastroesophageal reflux (GERD) 12/11/2021   Smoking 12/11/2021   Chronic obstructive pulmonary disease (HCC) 12/11/2021    -Despite her the mushy status of her movements, I suspect some additional fiber supplementation may be helpful at helping her regulate these.  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  However she is much better off than having constipation at this point in time.  She reports that money is tight and purchasing additional fiber supplementation may not be feasible, but will consider this in the future.  As for now there is no need for regular follow-up, we will be glad to see her again should any new issues arise.   [name] M.D., FACS 11/21/2022, 10:55 AM

## 2022-11-22 ENCOUNTER — Inpatient Hospital Stay: Payer: Self-pay | Admitting: Family Medicine

## 2022-11-22 NOTE — Progress Notes (Deleted)
New patient visit   Patient: Stacy Gilmore   DOB: [birth year]   62 y.o. Female  MRN: [id number] Visit Date: 11/22/2022  Today's healthcare provider: Sherlyn Hay, DO   No chief complaint on file.  Subjective    Stacy Gilmore is a 61 y.o. female who presents today as a new patient to establish care and for a hospital follow-up.  HPI  .[email or web address]  ***  Past Medical History:  Diagnosis Date   Acute diverticulitis 06/19/2022   Asthma    COPD (chronic obstructive pulmonary disease) (HCC)    Diverticulitis large intestine 09/28/2022   Diverticulitis of colon 07/25/2022   Diverticulitis of intestine with abscess, recurrent 06/19/2022   GERD (gastroesophageal reflux disease)    History of diverticulitis    Past Surgical History:  Procedure Laterality Date   APPENDECTOMY N/A 10/04/2022   Procedure: APPENDECTOMY;  Surgeon: [name], MD;  Location: [facility] ORS;  Service: General;  Laterality: N/A;   CERVICAL SPINE SURGERY     CHOLECYSTECTOMY     COLON RESECTION SIGMOID N/A 10/04/2022   Procedure: COLON RESECTION SIGMOID;  Surgeon: [name], MD;  Location: [facility] ORS;  Service: General;  Laterality: N/A;  Splenic Flexure Takedown   LAPAROTOMY N/A 10/04/2022   Procedure: EXPLORATION LAPAROTOMY;  Surgeon: [name], MD;  Location: [facility] ORS;  Service: General;  Laterality: N/A;   TUBAL LIGATION     Family Status  Relation Name Status   Mother  Deceased   Father  Alive   Sister  Alive   Brother  Deceased   MGM  Deceased   MGF  Deceased   PGM  Deceased   PGF  Deceased  No partnership data on file   Family History  Problem Relation Age of Onset   Anxiety disorder Mother    Lung cancer Mother    Thyroid disease Mother    Epilepsy Mother    Other Father        unknown medical history   Breast cancer Sister    Cervical cancer Sister    Epilepsy Brother    Other Maternal Grandmother        unknown medical history   Other Maternal  Grandfather        unknown medical history   Diabetes Paternal Grandmother    Other Paternal Grandfather        unknown medical history   Social History   Socioeconomic History   Marital status: Unknown    Spouse name: Not on file   Number of children: Not on file   Years of education: Not on file   Highest education level: Not on file  Occupational History   Not on file  Tobacco Use   Smoking status: Every Day    Current packs/day: 0.25    Average packs/day: 0.3 packs/day for 40.0 years (10.0 ttl pk-yrs)    Types: Cigarettes    Passive exposure: Past   Smokeless tobacco: Never   Tobacco comments:    [place]ashion Quit info given, patient down to 2 cigarettes per day  Vaping Use   Vaping status: Never Used  Substance and Sexual Activity   Alcohol use: Yes    Alcohol/week: 8.0 standard drinks of alcohol    Types: 8 Cans of beer per week    Comment: 06/19/22 drank a 40oz beer a few days ago, 1/2 of a 15 pack of beer weekly, last use ~ 02/04/22   Drug use: Not Currently    Types: "  Crack" cocaine, Cocaine, Methamphetamines, Marijuana    Comment: smokes Marijuana once per week-last use 01/2022, last use crack cocaine 12/08/21   Sexual activity: Not Currently  Other Topics Concern   Not on file  Social History Narrative   Not on file   Social Determinants of Health   Financial Resource Strain: High Risk (03/19/2022)   Overall Financial Resource Strain (CARDIA)    Difficulty of Paying Living Expenses: Very hard  Food Insecurity: Food Insecurity Present (11/15/2022)   Hunger Vital Sign    Worried About Running Out of Food in the Last Year: Sometimes true    Ran Out of Food in the Last Year: Sometimes true  Transportation Needs: Unmet Transportation Needs (11/15/2022)   PRAPARE - Administrator, Civil Service (Medical): Yes    Lack of Transportation (Non-Medical): No  Physical Activity: Inactive (03/19/2022)   Exercise Vital Sign    Days of Exercise per Week: 0 days    Minutes  of Exercise per Session: 0 min  Stress: Stress Concern Present (03/19/2022)   [employer] of Occupational Health - Occupational Stress Questionnaire    Feeling of Stress : Very much  Social Connections: Socially Isolated (03/19/2022)   Social Connection and Isolation Panel [NHANES]    Frequency of Communication with Friends and Family: Never    Frequency of Social Gatherings with Friends and Family: Never    Attends Religious Services: Never    Database administrator or Organizations: No    Attends Engineer, structural: Not on file    Marital Status: Never married   Outpatient Medications Prior to Visit  Medication Sig   albuterol (VENTOLIN HFA) 108 (90 Base) MCG/ACT inhaler Inhale 2 puffs into the lungs every 4 (four) hours as needed for wheezing or shortness of breath.   clindamycin (CLEOCIN) 300 MG capsule Take 1 capsule (300 mg total) by mouth 3 (three) times daily for 7 days.   fluticasone furoate-vilanterol (BREO ELLIPTA) 100-25 MCG/ACT AEPB Inhale 1 puff into the lungs daily. Rinse and brush thoroughly after administration   ibuprofen (ADVIL) 600 MG tablet Take 1 tablet (600 mg total) by mouth every 6 (six) hours as needed.   pantoprazole (PROTONIX) 40 MG tablet Take 1 tablet (40 mg total) by mouth daily.   senna-docusate (SENOKOT-S) 8.6-50 MG tablet Take 1 tablet by mouth at bedtime as needed for mild constipation.   No facility-administered medications prior to visit.   Allergies  Allergen Reactions   Bee Venom Swelling   Penicillins Hives and Swelling    Tolerated ceftriaxone   Armoracia Rusticana Ext (Horseradish) Rash    Blisters the mouth   Other Rash    Ragu spaghetti sauce    Immunization History  Administered Date(s) Administered   Influenza,inj,Quad PF,6+ Mos 01/17/2022    Health Maintenance  Topic Date Due   DTaP/Tdap/Td (1 - Tdap) Never done   Cervical Cancer Screening (HPV/Pap Cotest)  Never done   Colonoscopy  Never done   MAMMOGRAM  Never  done   Zoster Vaccines- Shingrix (1 of 2) Never done   INFLUENZA VACCINE  08/15/2022   COVID-19 Vaccine (1 - 2023-24 season) Never done   Hepatitis C Screening  Completed   HIV Screening  Completed   HPV VACCINES  Aged Out    Patient Care Team: Ellen Mayol, Monico Blitz, DO as PCP - General (Family Medicine)  Review of Systems  {Insert previous labs (optional):23779} {See past labs  Heme  Chem  Endocrine  Serology  Results Review (optional):1}   Objective    There were no vitals taken for this visit. {Insert last BP/Wt (optional):23777}{See vitals history (optional):1}   Physical Exam .pesec  Depression Screen    03/19/2022   10:36 AM 12/11/2021    1:35 PM  PHQ 2/9 Scores  PHQ - 2 Score 6 2  PHQ- 9 Score 16 10   No results found for any visits on 11/22/22.  Assessment & Plan     There are no diagnoses linked to this encounter. Marland Kitchenapsecabridge  ***  No follow-ups on file.     I discussed the assessment and treatment plan with the patient  The patient was provided an opportunity to ask questions and all were answered. The patient agreed with the plan and demonstrated an understanding of the instructions.   The patient was advised to call back or seek an in-person evaluation if the symptoms worsen or if the condition fails to improve as anticipated.    Sherlyn Hay, DO  [facility] [phone] (phone) [phone] (fax)  [facility]

## 2022-12-04 NOTE — Congregational Nurse Program (Signed)
  Dept: [phone]   Congregational Nurse Program Note  Date of Encounter: 12/04/2022 Client to [facility] day center to advise that her partner has his disability approved and they are now housed, New address [address] [address]. Chart notes updated. Client also given the numbers for 2 dentists in [place] that accept Medicaid. [facility] and [facility]. Client wishes to make her own appointment. No other needs at this time. Madolyn Frieze Past Medical History: Past Medical History:  Diagnosis Date   Acute diverticulitis 06/19/2022   Asthma    COPD (chronic obstructive pulmonary disease) (HCC)    Diverticulitis large intestine 09/28/2022   Diverticulitis of colon 07/25/2022   Diverticulitis of intestine with abscess, recurrent 06/19/2022   GERD (gastroesophageal reflux disease)    History of diverticulitis     Encounter Details:  Community Questionnaire - 12/04/22 0914       Questionnaire   Ask client: Do you give verbal consent for me to treat you today? Yes    Student Assistance N/A    Location Patient Served  [facility]    Encounter Setting CN site    Population Status Unknown   client now has a boarding room   Insurance Medicaid   Client now has [place] [employer] Medicaid   Insurance/Financial Assistance Referral N/A    Medication N/A   medications provided through [facility]   Medical Provider Yes    Screening Referrals Made N/A    Medical Referrals Made N/A   [facility] practice   Medical Appointment Completed N/A    CNP Interventions Advocate/Support    Screenings CN Performed N/A    ED Visit Averted N/A   no ED visit needed   Life-Saving Intervention Made N/A

## 2022-12-16 ENCOUNTER — Ambulatory Visit: Payer: Self-pay | Admitting: Family Medicine

## 2022-12-19 ENCOUNTER — Encounter: Payer: Medicaid Other | Admitting: Infectious Diseases

## 2022-12-23 ENCOUNTER — Ambulatory Visit: Payer: Medicaid Other | Admitting: Gastroenterology

## 2022-12-23 NOTE — Progress Notes (Unsigned)
[name] MD, MRCP(U.K) [address]  [address]  [place], [place] [address]  Main: [phone]  Fax: [phone]   Primary Care Physician: Sherlyn Hay, DO  Primary Gastroenterologist:  Dr. [name]   No chief complaint on file.   HPI: Stacy Gilmore is a 62 y.o. female   Summary of history :  Initially referred and seen in 07/2022 for diverticulitis. She presented  to the emergency room in June 2024 left lower quadrant pain abscess noted on CT scan, was admitted and discharged on 06/27/2022 in addition to concerns for diverticulitis of the descending colon treated with antibiotics for 2 weeks.  Seen by Dr. Claudine Mouton plan for repeat CT scan in which was performed in July 2024 .  No recent colonoscopy on epic.She then had  recurrence of her left lower quadrant pain subsequently .   Interval history  08/01/2022-12/23/2022  08/05/2022: Decreased wall thickening of the descending colon when compared with most recent prior. Unchanged persistent small area of focal soft tissue located along the anterior mesenteric border of the descending colon, likely a small area of scarring. 2. Mildly enlarged periportal lymph nodes, unchanged when compared with multiple prior exams.   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 Signs of fistulous communication between the distal descendingcolon and the adjacent small bowel is noted with a low-attenuation, peripherally enhancing fluid collection involving the affected adjacent small bowel which measures 2 cm. 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  2. Mild diffuse low  attenuation wall thickening and intramural fatty deposition throughout the colon. Findings are nonspecific but can be seen in the setting of chronic inflammatory bowel disease.    Current Outpatient Medications  Medication Sig Dispense Refill   albuterol (VENTOLIN HFA) 108 (90 Base) MCG/ACT inhaler Inhale 2 puffs into the lungs every 4 (four) hours as needed for wheezing or shortness of breath. 8.5 g 5   fluticasone furoate-vilanterol (BREO ELLIPTA) 100-25 MCG/ACT AEPB Inhale 1 puff into the lungs daily. Rinse and brush thoroughly after administration 180 each 3   ibuprofen (ADVIL) 600 MG tablet Take 1 tablet (600 mg total) by mouth every 6 (six) hours as needed. 30 tablet 0   pantoprazole (PROTONIX) 40 MG tablet Take 1 tablet (40 mg total) by mouth daily. 30 tablet 0   senna-docusate (SENOKOT-S) 8.6-50 MG tablet Take 1 tablet by mouth at bedtime as needed for mild constipation. 30 tablet 1   No current facility-administered medications for this visit.    Allergies as of 12/23/2022 - Review Complete 11/21/2022  Allergen Reaction Noted   Bee venom Swelling 04/23/2020   Penicillins Hives and Swelling 04/23/2020   Armoracia rusticana ext (horseradish) Rash 04/23/2020   Other Rash 01/17/2022       Interval history   ***/***/202*   ***/***/2024   ROS:  General: Negative for anorexia, weight loss, fever, chills, fatigue, weakness. ENT: Negative for hoarseness, difficulty swallowing , nasal congestion. CV: Negative for chest pain, angina, palpitations, dyspnea on exertion, peripheral edema.  Respiratory: Negative for dyspnea at rest, dyspnea on exertion, cough, sputum, wheezing.  GI: See history of present illness. GU:  Negative for dysuria, hematuria, urinary incontinence, urinary frequency, nocturnal urination.  Endo:  Negative for unusual weight change.    Physical Examination:   There were no vitals taken for this visit.  General: Well-nourished, well-developed in no acute  distress.  Eyes: No icterus. Conjunctivae pink. Mouth: Oropharyngeal mucosa moist and pink , no lesions erythema or exudate. Lungs: Clear to auscultation bilaterally. Non-labored. Heart: Regular rate and rhythm, no murmurs rubs or gallops.  Abdomen: Bowel sounds are normal, nontender, nondistended, no hepatosplenomegaly or masses, no abdominal bruits or hernia , no rebound or guarding.   Extremities: No lower extremity edema. No clubbing or deformities. Neuro: Alert and oriented x 3.  Grossly intact. Skin: Warm and dry, no jaundice.   Psych: Alert and cooperative, normal mood and affect.   Imaging Studies: No results found.  Assessment and Plan:   Stacy Gilmore is a 62 y.o. y/o female for diverticulitis.Admission in June 2024 for complicated diverticulitis with abscess, treated with antibiotics: complicated diverticulitis in 09/2022 with abscess, fistula requiring partial colectomy-sigmoid , treated with TPN for nutrition.     Dr [name]  MD,MRCP [facility]) Follow up in ***

## 2023-01-01 NOTE — Congregational Nurse Program (Signed)
  Dept: [phone]   Congregational Nurse Program Note  Date of Encounter: 01/01/2023 Client to [facility] for support and conversation. She reports she needs a refill on her "new stomach medication", she has had to reschedule her PCP appointment from December to January. RN instructed her to contact the pharmacy and request they contact her PCP for a refill. Client voiced understanding.Support givne. Francesco Runner BSN, RN Past Medical History: Past Medical History:  Diagnosis Date   Acute diverticulitis 06/19/2022   Asthma    COPD (chronic obstructive pulmonary disease) (HCC)    Diverticulitis large intestine 09/28/2022   Diverticulitis of colon 07/25/2022   Diverticulitis of intestine with abscess, recurrent 06/19/2022   GERD (gastroesophageal reflux disease)    History of diverticulitis     Encounter Details:  Community Questionnaire - 01/01/23 0930       Questionnaire   Ask client: Do you give verbal consent for me to treat you today? Yes    Student Assistance N/A    Location Patient Served  [facility]    Encounter Setting CN site    Population Status Unknown   client now has a boarding room   Insurance Medicaid   Client now has [place] [employer] Medicaid   Insurance/Financial Assistance Referral N/A    Medication N/A   medications provided through [facility]   Medical Provider Yes    Screening Referrals Made N/A    Medical Referrals Made N/A   [facility] practice   Medical Appointment Completed N/A    CNP Interventions Advocate/Support;Educate    Screenings CN Performed N/A    ED Visit Averted N/A   no ED visit needed   Life-Saving Intervention Made N/A

## 2023-01-10 ENCOUNTER — Other Ambulatory Visit: Payer: Self-pay

## 2023-01-22 ENCOUNTER — Emergency Department: Payer: Medicaid Other

## 2023-01-22 ENCOUNTER — Other Ambulatory Visit: Payer: Self-pay

## 2023-01-22 ENCOUNTER — Emergency Department
Admission: EM | Admit: 2023-01-22 | Discharge: 2023-01-22 | Disposition: A | Discharge status: Home / Self Care | Payer: Medicaid Other | Attending: Emergency Medicine | Admitting: Emergency Medicine

## 2023-01-22 DIAGNOSIS — I10 Essential (primary) hypertension: Secondary | ICD-10-CM | POA: Diagnosis not present

## 2023-01-22 DIAGNOSIS — R109 Unspecified abdominal pain: Secondary | ICD-10-CM | POA: Diagnosis present

## 2023-01-22 DIAGNOSIS — R1084 Generalized abdominal pain: Secondary | ICD-10-CM | POA: Insufficient documentation

## 2023-01-22 DIAGNOSIS — J45909 Unspecified asthma, uncomplicated: Secondary | ICD-10-CM | POA: Insufficient documentation

## 2023-01-22 LAB — LIPASE, BLOOD: Lipase: 85 U/L — ABNORMAL HIGH (ref 11–51)

## 2023-01-22 LAB — URINALYSIS, ROUTINE W REFLEX MICROSCOPIC
Bilirubin Urine: NEGATIVE
Glucose, UA: NEGATIVE mg/dL
Hgb urine dipstick: NEGATIVE
Ketones, ur: NEGATIVE mg/dL
Leukocytes,Ua: NEGATIVE
Nitrite: NEGATIVE
Protein, ur: NEGATIVE mg/dL
Specific Gravity, Urine: >1.046 — ABNORMAL HIGH (ref 1.005–1.030)
pH: 5 (ref 5.0–8.0)

## 2023-01-22 LAB — COMPREHENSIVE METABOLIC PANEL
ALT: 42 U/L (ref 0–44)
AST: 39 U/L (ref 15–41)
Albumin: 4.1 g/dL (ref 3.5–5.0)
Alkaline Phosphatase: 66 U/L (ref 38–126)
Anion gap: 11 (ref 5–15)
BUN: 11 mg/dL (ref 8–23)
CO2: 24 mmol/L (ref 22–32)
Calcium: 8.8 mg/dL — ABNORMAL LOW (ref 8.9–10.3)
Chloride: 106 mmol/L (ref 98–111)
Creatinine, Ser: 0.57 mg/dL (ref 0.44–1.00)
GFR, Estimated: >60 mL/min (ref >60)
Glucose, Bld: 91 mg/dL (ref 70–99)
Potassium: 4.1 mmol/L (ref 3.5–5.1)
Sodium: 141 mmol/L (ref 135–145)
Total Bilirubin: 0.5 mg/dL (ref 0.0–1.2)
Total Protein: 7.5 g/dL (ref 6.5–8.1)

## 2023-01-22 LAB — CBC
HCT: 45.5 % (ref 36.0–46.0)
Hemoglobin: 14.9 g/dL (ref 12.0–15.0)
MCH: 28.1 pg (ref 26.0–34.0)
MCHC: 32.7 g/dL (ref 30.0–36.0)
MCV: 85.8 fL (ref 80.0–100.0)
Platelets: 305 10*3/uL (ref 150–400)
RBC: 5.3 MIL/uL — ABNORMAL HIGH (ref 3.87–5.11)
RDW: 14.1 % (ref 11.5–15.5)
WBC: 7.2 10*3/uL (ref 4.0–10.5)
nRBC: 0 % (ref 0.0–0.2)

## 2023-01-22 MED ORDER — IOHEXOL 300 MG/ML  SOLN
100.0000 mL | Freq: Once | INTRAMUSCULAR | Status: AC | PRN
  Administered 2023-01-22: 100 mL via INTRAVENOUS

## 2023-01-22 MED ORDER — ONDANSETRON 4 MG PO TBDP
4.0000 mg | ORAL_TABLET | Freq: Three times a day (TID) | ORAL | 0 refills | Status: DC | PRN
  Filled 2023-01-22: qty 12, 4d supply, fill #0

## 2023-01-22 MED ORDER — ONDANSETRON HCL 4 MG/2ML IJ SOLN
4.0000 mg | Freq: Once | INTRAMUSCULAR | Status: AC
  Administered 2023-01-22: 4 mg via INTRAVENOUS
  Filled 2023-01-22: qty 2

## 2023-01-22 MED ORDER — OXYCODONE-ACETAMINOPHEN 5-325 MG PO TABS
1.0000 | ORAL_TABLET | Freq: Once | ORAL | Status: AC
  Administered 2023-01-22: 1 via ORAL
  Filled 2023-01-22: qty 1

## 2023-01-22 NOTE — ED Triage Notes (Signed)
 Pt to ED via EMS from home, pt reports abd pain and swelling that began 3 days ago. 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 Pain and swelling began after moving heavy furniture a few days ago. Pt reports some nausea

## 2023-01-22 NOTE — ED Provider Notes (Signed)
 [facility] Provider Note    Event Date/Time   First MD Initiated Contact with Patient 01/22/23 [phone]     (approximate)   History   Chief Complaint Abdominal Pain   HPI  Stacy Gilmore is a 63 y.o. female with past medical history of hypertension, asthma, GERD, and sigmoid colectomy who presents to the ED complaining of abdominal pain.  Patient reports that she has had 3 days of increasing pain across her entire abdomen, which she also feels like has been swollen and distended.  She describes a knot in the middle of her upper abdomen along with nausea and multiple episodes of vomiting.  She denies any diarrhea, has not had any fevers, dysuria, or flank pain.  She is concerned there could be a complication from her recent sigmoid colectomy from the end of last year.     Physical Exam   Triage Vital Signs: ED Triage Vitals  Encounter Vitals Group     BP 01/22/23 0420 128/82     Systolic BP Percentile --      Diastolic BP Percentile --      Pulse Rate 01/22/23 0420 78     Resp 01/22/23 0420 20     Temp 01/22/23 0420 97.8 F (36.6 C)     Temp Source 01/22/23 0420 Oral     SpO2 01/22/23 0420 98 %     Weight 01/22/23 0419 137 lb (62.1 kg)     Height 01/22/23 0419 5' 6 (1.676 m)     Head Circumference --      Peak Flow --      Pain Score 01/22/23 0419 10     Pain Loc --      Pain Education --      Exclude from Growth Chart --     Most recent vital signs: Vitals:   01/22/23 0420  BP: 128/82  Pulse: 78  Resp: 20  Temp: 97.8 F (36.6 C)  SpO2: 98%    Constitutional: Alert and oriented. Eyes: Conjunctivae are normal. Head: Atraumatic. Nose: No congestion/rhinnorhea. Mouth/Throat: Mucous membranes are moist.  Cardiovascular: Normal rate, regular rhythm. Grossly normal heart sounds.  2+ radial pulses bilaterally. Respiratory: Normal respiratory effort.  No retractions. Lungs CTAB. Gastrointestinal: Soft and diffusely tender to palpation  with no rebound or guarding. No distention. Musculoskeletal: No lower extremity tenderness nor edema.  Neurologic:  Normal speech and language. No gross focal neurologic deficits are appreciated.    ED Results / Procedures / Treatments   Labs (all labs ordered are listed, but only abnormal results are displayed) Labs Reviewed  LIPASE, BLOOD - Abnormal; Notable for the following components:      Result Value   Lipase 85 (*)    All other components within normal limits  COMPREHENSIVE METABOLIC PANEL - Abnormal; Notable for the following components:   Calcium  8.8 (*)    All other components within normal limits  CBC - Abnormal; Notable for the following components:   RBC 5.30 (*)    All other components within normal limits  URINALYSIS, ROUTINE W REFLEX MICROSCOPIC - Abnormal; Notable for the following components:   Color, Urine YELLOW (*)    APPearance CLEAR (*)    Specific Gravity, Urine >1.046 (*)    All other components within normal limits   RADIOLOGY CT abdomen/pelvis reviewed and interpreted by me with no inflammatory changes, focal fluid collections, or dilated bowel loops.  PROCEDURES:  Critical Care performed: No  Procedures  MEDICATIONS ORDERED IN ED: Medications  iohexol  (OMNIPAQUE ) 300 MG/ML solution 100 mL (100 mLs Intravenous Contrast Given 01/22/23 0531)  ondansetron  (ZOFRAN ) injection 4 mg (4 mg Intravenous Given 01/22/23 0738)  oxyCODONE -acetaminophen  (PERCOCET/ROXICET) 5-325 MG per tablet 1 tablet (1 tablet Oral Given 01/22/23 0739)     IMPRESSION / MDM / ASSESSMENT AND PLAN / ED COURSE  I reviewed the triage vital signs and the nursing notes.                              63 y.o. female with past medical history of hypertension, asthma, GERD, and sigmoid colectomy who presents to the ED complaining of diffuse abdominal pain and distention for the past 3 days.  Patient's presentation is most consistent with acute presentation with potential threat to life  or bodily function.  Differential diagnosis includes, but is not limited to, bowel obstruction, hernia, gastroenteritis, appendicitis, pancreatitis, hepatitis, biliary colic, cholecystitis, diverticulitis.  Patient nontoxic-appearing and in no acute distress, vital signs are unremarkable.  Her abdomen is soft with diffuse tenderness, no obvious distention or hernia noted.  Labs are reassuring with no significant anemia, leukocytosis, electrolyte abnormality, or AKI.  LFTs are unremarkable, lipase mildly elevated but no focal epigastric tenderness to suggest pancreatitis.  CT imaging appears consistent with enteritis, she does have multiple small fat-containing hernias which could correlate with her feeling of a knot.  Symptoms improved on reassessment, patient tolerating oral intake without difficulty.  She is appropriate for discharge home with outpatient follow-up, was counseled to return to the ED for new or worsening symptoms.  Patient agrees with plan.      FINAL CLINICAL IMPRESSION(S) / ED DIAGNOSES   Final diagnoses:  Generalized abdominal pain     Rx / DC Orders   ED Discharge Orders          Ordered    ondansetron  (ZOFRAN -ODT) 4 MG disintegrating tablet  Every 8 hours PRN        01/22/23 0745             Note:  This document was prepared using Dragon voice recognition software and may include unintentional dictation errors.   Willo Dunnings, MD 01/22/23 [phone]

## 2023-01-27 NOTE — Progress Notes (Addendum)
 Patient ID: Stacy Gilmore, female   DOB: October 05, 1960, 63 y.o.   MRN: [id number]  Chief Complaint: Hernias  History of Present Illness Stacy Gilmore is a 63 y.o. female with with recent presentation to ED for abdominal pain, found on CT scan to have incisional hernia about the periumbilical area, and groin hernias.  She is well aware of all 3 hernias thanks to the CT.  And reports having groin pain as well as upper abdominal wall pain.  She has a rather prominent bulge that is from the upper aspect of her incision seemingly cephalad from her umbilicus.  This has been giving her pain.  She has been recently moving furniture, transitioning into a new home.  She continues to smoke, though she indicates not much.  She indicates she does have some groin pain as well.  All these are exacerbated by lifting.  Past Medical History Past Medical History:  Diagnosis Date   Acute diverticulitis 06/19/2022   Asthma    COPD (chronic obstructive pulmonary disease) (HCC)    Diverticulitis large intestine 09/28/2022   Diverticulitis of colon 07/25/2022   Diverticulitis of intestine with abscess, recurrent 06/19/2022   GERD (gastroesophageal reflux disease)    History of diverticulitis       Past Surgical History:  Procedure Laterality Date   APPENDECTOMY N/A 10/04/2022   Procedure: APPENDECTOMY;  Surgeon: Lane Shope, MD;  Location: [facility];  Service: General;  Laterality: N/A;   CERVICAL SPINE SURGERY     CHOLECYSTECTOMY     COLON RESECTION SIGMOID N/A 10/04/2022   Procedure: COLON RESECTION SIGMOID;  Surgeon: Lane Shope, MD;  Location: [facility];  Service: General;  Laterality: N/A;  Splenic Flexure Takedown   LAPAROTOMY N/A 10/04/2022   Procedure: EXPLORATION LAPAROTOMY;  Surgeon: Lane Shope, MD;  Location: [facility];  Service: General;  Laterality: N/A;   TUBAL LIGATION      Allergies  Allergen Reactions   Bee Venom Swelling   Penicillins Hives and Swelling    Tolerated  ceftriaxone    Armoracia Rusticana Ext (Horseradish) Rash    Blisters the mouth   Other Rash    Ragu spaghetti sauce    Current Outpatient Medications  Medication Sig Dispense Refill   albuterol  (VENTOLIN  HFA) 108 (90 Base) MCG/ACT inhaler Inhale 2 puffs into the lungs every 4 (four) hours as needed for wheezing or shortness of breath. 8.5 g 5   fluticasone  furoate-vilanterol (BREO ELLIPTA ) 100-25 MCG/ACT AEPB Inhale 1 puff into the lungs daily. Rinse and brush thoroughly after administration 180 each 3   senna-docusate (SENOKOT-S) 8.6-50 MG tablet Take 1 tablet by mouth at bedtime as needed for mild constipation. 30 tablet 1   ibuprofen  (ADVIL ) 600 MG tablet Take 1 tablet (600 mg total) by mouth every 6 (six) hours as needed. 30 tablet 0   ondansetron  (ZOFRAN -ODT) 4 MG disintegrating tablet Take 1 tablet (4 mg total) by mouth every 8 (eight) hours as needed for nausea or vomiting. 30 tablet 0   pantoprazole  (PROTONIX ) 40 MG tablet Take 1 tablet (40 mg total) by mouth daily. 30 tablet 0   No current facility-administered medications for this visit.    Family History Family History  Problem Relation Age of Onset   Anxiety disorder Mother    Lung cancer Mother    Thyroid disease Mother    Epilepsy Mother    Other Father        unknown medical history   Breast cancer Sister    Cervical  cancer Sister    Epilepsy Brother    Other Maternal Grandmother        unknown medical history   Other Maternal Grandfather        unknown medical history   Diabetes Paternal Grandmother    Other Paternal Grandfather        unknown medical history      Social History Social History   Tobacco Use   Smoking status: Every Day    Current packs/day: 0.25    Average packs/day: 0.3 packs/day for 40.0 years (10.0 ttl pk-yrs)    Types: Cigarettes    Passive exposure: Past   Smokeless tobacco: Never   Tobacco comments:    [place] Quit info given, patient down to 2 cigarettes per day  Vaping Use    Vaping status: Never Used  Substance Use Topics   Alcohol use: Yes    Alcohol/week: 8.0 standard drinks of alcohol    Types: 8 Cans of beer per week    Comment: 06/19/22 drank a 40oz beer a few days ago, 1/2 of a 15 pack of beer weekly, last use ~ 02/04/22   Drug use: Not Currently    Types: Crack cocaine, Cocaine, Methamphetamines, Marijuana    Comment: smokes Marijuana once per week-last use 01/2022, last use crack cocaine 12/08/21        Review of Systems  All other systems reviewed and are negative.    Physical Exam Blood pressure (!) 140/82, pulse 66, temperature 98.8 F (37.1 C), height 5' 6.5 (1.689 m), weight 149 lb (67.6 kg), SpO2 97%. Last Weight  Most recent update: 01/28/2023 10:55 AM    Weight  67.6 kg (149 lb)             CONSTITUTIONAL: Well developed, and nourished, appropriately responsive and aware without distress.   EYES: Sclera non-icteric.   EARS, NOSE, MOUTH AND THROAT:  The oropharynx is clear. Oral mucosa is pink and moist.    Hearing is intact to voice.  NECK: Trachea is midline, and there is no jugular venous distension.  LYMPH NODES:  Lymph nodes in the neck are not appreciated. RESPIRATORY:  Lungs are clear, and breath sounds are equal bilaterally.  Normal respiratory effort without pathologic use of accessory muscles. CARDIOVASCULAR: Heart is regular in rate and rhythm.   Well perfused.  GI: The abdomen is notable for a prominent bulge in the supraumbilical area, [place] and readily reducible.  Estimated defect diameter 5 cm.  Otherwise soft, nontender, and nondistended. There were no palpable masses.  I did not appreciate hepatosplenomegaly. There were normal bowel sounds.   GU: With Caryl Lyn present as chaperone, both groins were tender with Valsalva, but without prominent bulge. MUSCULOSKELETAL:  Symmetrical muscle tone appreciated in all four extremities.    SKIN: Skin turgor is normal. No pathologic skin lesions appreciated.  NEUROLOGIC:   Motor and sensation appear grossly normal.  Cranial nerves are grossly without defect. PSYCH:  Alert and oriented to person, place and time. Affect is appropriate for situation.  Data Reviewed I have personally reviewed what is currently available of the patient's imaging, recent labs and medical records.   Labs:     Latest Ref Rng & Units 01/22/2023    4:24 AM 11/13/2022    5:01 AM 11/12/2022   12:19 PM  CBC  WBC 4.0 - 10.5 K/uL 7.2  5.2  8.0   Hemoglobin 12.0 - 15.0 g/dL 85.0  88.3  86.6   Hematocrit 36.0 - 46.0 %  45.5  35.3  40.6   Platelets 150 - 400 K/uL 305  220  282       Latest Ref Rng & Units 01/22/2023    4:24 AM 11/14/2022    9:38 AM 11/14/2022    8:12 AM  CMP  Glucose 70 - 99 mg/dL 91   894   BUN 8 - 23 mg/dL 11   7   Creatinine 9.55 - 1.00 mg/dL 9.42   9.57   Sodium 864 - 145 mmol/L 141   137   Potassium 3.5 - 5.1 mmol/L 4.1   4.5   Chloride 98 - 111 mmol/L 106   102   CO2 22 - 32 mmol/L 24   27   Calcium  8.9 - 10.3 mg/dL 8.8   8.8   Total Protein 6.5 - 8.1 g/dL 7.5  6.4    Total Bilirubin 0.0 - 1.2 mg/dL 0.5  0.3    Alkaline Phos 38 - 126 U/L 66  68    AST 15 - 41 U/L 39  94    ALT 0 - 44 U/L 42  139     Imaging: Radiological images reviewed:  CLINICAL DATA:  Abdominal pain. Bowel obstruction suspected, with pain and swelling onset 3 days ago. Appendectomy performed last fall. Sigmoid colectomy with primary anastomosis. Both done 10/04/2022.   EXAM: CT ABDOMEN AND PELVIS WITH CONTRAST   TECHNIQUE: Multidetector CT imaging of the abdomen and pelvis was performed using the standard protocol following bolus administration of intravenous contrast.   RADIATION DOSE REDUCTION: This exam was performed according to the departmental dose-optimization program which includes automated exposure control, adjustment of the mA and/or kV according to patient size and/or use of iterative reconstruction technique.   CONTRAST:  OMNIPAQUE  IOHEXOL  300 MG/ML  SOLN    COMPARISON:  Preoperative CT with IV contrast 10/02/2022, 09/28/2022   FINDINGS: Lower chest: No acute abnormality.   Hepatobiliary: No focal liver abnormality is seen. Status post cholecystectomy. No biliary dilatation.   Pancreas: No abnormality.   Spleen: No abnormality.  No splenomegaly.   Adrenals/Urinary Tract: Adrenal glands are unremarkable. Kidneys are normal, without renal calculi, focal lesion, or hydronephrosis. Bladder is unremarkable.   Stomach/Bowel: Contracted stomach with chronic thickened folds. Normal caliber small bowel with scattered segments showing mucosal enhancement without thickening, mild fluid filling.   Findings consistent with nonspecific enteritis without inflammatory changes.   Status post appendectomy. Status post sigmoid colectomy and reanastomosis.   Few scattered colonic diverticula noted without acute inflammatory change. No overt wall thickening.   Vascular/Lymphatic: There is aortic atherosclerosis without AAA or dissection. There are no enlarged lymph nodes.   Reproductive: Uterus and bilateral adnexa are unremarkable.   Other: Small left paraumbilical and bilateral inguinal fat hernias. No incarcerated hernia. No free fluid, free air, free hemorrhage focal inflammatory process.   Musculoskeletal: Osteopenia and mild degenerative change of the spine. No acute or other significant osseous findings.   IMPRESSION: 1. Imaging findings of nonspecific enteritis without inflammatory changes. 2. Contracted stomach with chronic thickened folds. 3. Status post sigmoid colectomy and reanastomosis, appendectomy. 4. Aortic atherosclerosis. 5. Small left paraumbilical and bilateral inguinal fat hernias. 6. Osteopenia and degenerative change.   Aortic Atherosclerosis (ICD10-I70.0).     Electronically Signed   By: Francis Quam M.D.   On: 01/22/2023 07:05   Within last 24 hrs: No results found.  Assessment    Incisional  hernia. Bilateral fat filled inguinal defects.  Patient Active Problem List  Diagnosis Date Noted   Status post partial colectomy 11/21/2022   Cellulitis of face 11/12/2022   Elevated LFTs 11/12/2022   Electrolyte abnormality 11/12/2022   Hypokalemia 10/11/2022   Gastroesophageal reflux disease without esophagitis 06/27/2022   Essential hypertension 06/27/2022   Protein-calorie malnutrition, moderate (HCC) 06/27/2022   Malnutrition of moderate degree 06/24/2022   Dysuria 06/19/2022   Coronary atherosclerosis 02/07/2022   Aortic atherosclerosis (HCC) 02/07/2022   Diarrhea 02/06/2022   Cocaine use 02/06/2022   Generalized joint pain 01/17/2022   Health care maintenance 01/17/2022   Encounter to establish care 12/11/2021   History of asthma 12/11/2021   History of gastroesophageal reflux (GERD) 12/11/2021   Smoking 12/11/2021   Chronic obstructive pulmonary disease (HCC) 12/11/2021    Plan    I strongly emphasized smoking cessation once again.  She is aware that any elective hernia repair by me will require proof, and avoidance of secondary exposure to smoking as well. She indicates that her social situation may improve when she gets her disability status.  Currently she seems dependent upon the significant other that is, to her opinion, not going to quit smoking.  We wrapped her in an abdominal binder that fit well today.  I believe this will be very beneficial. We will refill her Protonix , and Zofran .  And prescribe ibuprofen  to assist with the abdominal wall pain.  I discussed possibility of incarceration, strangulation, enlargement in size over time, and the need for emergency surgery in the face of these.  Also reviewed the techniques of reduction should incarceration occur, and when unsuccessful to present to the ED.  Also discussed that surgery risks include recurrence which can be up to 30% in the case of complex hernias, use of prosthetic materials (mesh) and the increased  risk of infection and the possible need for re-operation and removal of mesh, possibility of post-op SBO or ileus, and the risks of general anesthetic including heart attack, stroke, sudden death or some reaction to anesthetic medications. The patient, and those present, appear to understand the risks, any and all questions were answered to the patient's satisfaction.  No guarantees were ever expressed or implied.  Will have her back in a month, or as needed, to assess her progress with tobacco avoidance.   Face-to-face time spent with the patient and accompanying care providers(if present) was 30 minutes, with more than 50% of the time spent counseling, educating, and coordinating care of the patient.    These notes generated with voice recognition software. I apologize for typographical errors.  Honor Leghorn M.D., FACS 01/28/2023, 11:43 AM

## 2023-01-28 ENCOUNTER — Encounter: Payer: Self-pay | Admitting: Surgery

## 2023-01-28 ENCOUNTER — Other Ambulatory Visit: Payer: Self-pay

## 2023-01-28 ENCOUNTER — Ambulatory Visit (INDEPENDENT_AMBULATORY_CARE_PROVIDER_SITE_OTHER): Payer: Medicaid Other | Admitting: Surgery

## 2023-01-28 VITALS — BP 140/82 | HR 66 | Temp 98.8°F | Ht 66.5 in | Wt 149.0 lb

## 2023-01-28 DIAGNOSIS — K4020 Bilateral inguinal hernia, without obstruction or gangrene, not specified as recurrent: Secondary | ICD-10-CM | POA: Diagnosis not present

## 2023-01-28 DIAGNOSIS — K432 Incisional hernia without obstruction or gangrene: Secondary | ICD-10-CM

## 2023-01-28 HISTORY — DX: Incisional hernia without obstruction or gangrene: K43.2

## 2023-01-28 MED ORDER — IBUPROFEN 600 MG PO TABS
600.0000 mg | ORAL_TABLET | Freq: Four times a day (QID) | ORAL | 0 refills | Status: DC | PRN
  Filled 2023-01-28: qty 30, 8d supply, fill #0

## 2023-01-28 MED ORDER — ONDANSETRON 4 MG PO TBDP
4.0000 mg | ORAL_TABLET | Freq: Three times a day (TID) | ORAL | 0 refills | Status: DC | PRN
  Filled 2023-01-28: qty 30, 10d supply, fill #0

## 2023-01-28 MED ORDER — PANTOPRAZOLE SODIUM 40 MG PO TBEC
40.0000 mg | DELAYED_RELEASE_TABLET | Freq: Every day | ORAL | 0 refills | Status: DC
  Filled 2023-01-28: qty 30, 30d supply, fill #0

## 2023-01-28 NOTE — Patient Instructions (Addendum)
 We have refilled your Ibuprofen , Protonix , and Zofran .  We need you to stop smoking before we will schedule surgery.   Use the hernia binder as needed.   Follow up here in 2 months.   Managing the Challenge of Quitting Smoking Quitting smoking is a physical and mental challenge. You may have cravings, withdrawal symptoms, and temptation to smoke. Before quitting, work with your health care provider to make a plan that can help you manage quitting. Making a plan before you quit may keep you from smoking when you have the urge to smoke while trying to quit. How to manage lifestyle changes Managing stress Stress can make you want to smoke, and wanting to smoke may cause stress. It is important to find ways to manage your stress. You could try some of the following: Practice relaxation techniques. Breathe slowly and deeply, in through your nose and out through your mouth. Listen to music. Soak in a bath or take a shower. Imagine a peaceful place or vacation. Get some support. Talk with family or friends about your stress. Join a support group. Talk with a counselor or therapist. Get some physical activity. Go for a walk, run, or bike ride. Play a favorite sport. Practice yoga.  Medicines Talk with your health care provider about medicines that might help you deal with cravings and make quitting easier for you. Relationships Social situations can be difficult when you are quitting smoking. To manage this, you can: Avoid parties and other social situations where people might be smoking. Avoid alcohol. Leave right away if you have the urge to smoke. Explain to your family and friends that you are quitting smoking. Ask for support and let them know you might be a bit grumpy. Plan activities where smoking is not an option. General instructions Be aware that many people gain weight after they quit smoking. However, not everyone does. To keep from gaining weight, have a plan in place  before you quit, and stick to the plan after you quit. Your plan should include: Eating healthy snacks. When you have a craving, it may help to: Eat popcorn, or try carrots, celery, or other cut vegetables. Chew sugar-free gum. Changing how you eat. Eat small portion sizes at meals. Eat 4-6 small meals throughout the day instead of 1-2 large meals a day. Be mindful when you eat. You should avoid watching television or doing other things that might distract you as you eat. Exercising regularly. Make time to exercise each day. If you do not have time for a long workout, do short bouts of exercise for 5-10 minutes several times a day. Do some form of strengthening exercise, such as weight lifting. Do some exercise that gets your heart beating and causes you to breathe deeply, such as walking fast, running, swimming, or biking. This is very important. Drinking plenty of water or other low-calorie or no-calorie drinks. Drink enough fluid to keep your urine pale yellow.  How to recognize withdrawal symptoms Your body and mind may experience discomfort as you try to get used to not having nicotine  in your system. These effects are called withdrawal symptoms. They may include: Feeling hungrier than normal. Having trouble concentrating. Feeling irritable or restless. Having trouble sleeping. Feeling depressed. Craving a cigarette. These symptoms may surprise you, but they are normal to have when quitting smoking. To manage withdrawal symptoms: Avoid places, people, and activities that trigger your cravings. Remember why you want to quit. Get plenty of sleep. Avoid coffee and other drinks  that contain caffeine. These may worsen some of your symptoms. How to manage cravings Come up with a plan for how to deal with your cravings. The plan should include the following: A definition of the specific situation you want to deal with. An activity or action you will take to replace smoking. A clear idea  for how this action will help. The name of someone who could help you with this. Cravings usually last for 5-10 minutes. Consider taking the following actions to help you with your plan to deal with cravings: Keep your mouth busy. Chew sugar-free gum. Suck on hard candies or a straw. Brush your teeth. Keep your hands and body busy. Change to a different activity right away. Squeeze or play with a ball. Do an activity or a hobby, such as making bead jewelry, practicing needlepoint, or working with wood. Mix up your normal routine. Take a short exercise break. Go for a quick walk, or run up and down stairs. Focus on doing something kind or helpful for someone else. Call a friend or family member to talk during a craving. Join a support group. Contact a quitline. Where to find support To get help or find a support group: Call the National Cancer Institute's Smoking Quitline: 1-800-QUIT-NOW 3238860088) Text QUIT to SmokefreeTXT: 521151 Where to find more information Visit these websites to find more information on quitting smoking: U.S. Department of Health and Human Services: www.smokefree.gov American Lung Association: www.freedomfromsmoking.org Centers for Disease Control and Prevention (CDC): footballexhibition.com.br American Heart Association: www.heart.org Contact a health care provider if: You want to change your plan for quitting. The medicines you are taking are not helping. Your eating feels out of control or you cannot sleep. You feel depressed or become very anxious. Summary Quitting smoking is a physical and mental challenge. You will face cravings, withdrawal symptoms, and temptation to smoke again. Preparation can help you as you go through these challenges. Try different techniques to manage stress, handle social situations, and prevent weight gain. You can deal with cravings by keeping your mouth busy (such as by chewing gum), keeping your hands and body busy, calling family or  friends, or contacting a quitline for people who want to quit smoking. You can deal with withdrawal symptoms by avoiding places where people smoke, getting plenty of rest, and avoiding drinks that contain caffeine. This information is not intended to replace advice given to you by your health care provider. Make sure you discuss any questions you have with your health care provider. Document Revised: 12/22/2020 Document Reviewed: 12/22/2020 Elsevier Patient Education  2024 [employer].

## 2023-01-30 ENCOUNTER — Encounter: Payer: Self-pay | Admitting: Surgery

## 2023-02-11 ENCOUNTER — Other Ambulatory Visit: Payer: Self-pay

## 2023-02-11 ENCOUNTER — Ambulatory Visit (INDEPENDENT_AMBULATORY_CARE_PROVIDER_SITE_OTHER): Payer: Medicaid Other | Admitting: Family Medicine

## 2023-02-11 ENCOUNTER — Encounter: Payer: Self-pay | Admitting: Family Medicine

## 2023-02-11 VITALS — BP 126/78 | HR 75 | Ht 66.5 in | Wt 153.7 lb

## 2023-02-11 DIAGNOSIS — F332 Major depressive disorder, recurrent severe without psychotic features: Secondary | ICD-10-CM | POA: Diagnosis not present

## 2023-02-11 DIAGNOSIS — I700 Atherosclerosis of aorta: Secondary | ICD-10-CM

## 2023-02-11 DIAGNOSIS — Z7689 Persons encountering health services in other specified circumstances: Secondary | ICD-10-CM

## 2023-02-11 DIAGNOSIS — Z23 Encounter for immunization: Secondary | ICD-10-CM | POA: Diagnosis not present

## 2023-02-11 DIAGNOSIS — J439 Emphysema, unspecified: Secondary | ICD-10-CM

## 2023-02-11 DIAGNOSIS — F17200 Nicotine dependence, unspecified, uncomplicated: Secondary | ICD-10-CM

## 2023-02-11 DIAGNOSIS — F331 Major depressive disorder, recurrent, moderate: Secondary | ICD-10-CM | POA: Insufficient documentation

## 2023-02-11 DIAGNOSIS — H6123 Impacted cerumen, bilateral: Secondary | ICD-10-CM

## 2023-02-11 DIAGNOSIS — M2550 Pain in unspecified joint: Secondary | ICD-10-CM | POA: Diagnosis not present

## 2023-02-11 DIAGNOSIS — K219 Gastro-esophageal reflux disease without esophagitis: Secondary | ICD-10-CM

## 2023-02-11 DIAGNOSIS — F322 Major depressive disorder, single episode, severe without psychotic features: Secondary | ICD-10-CM | POA: Insufficient documentation

## 2023-02-11 DIAGNOSIS — R768 Other specified abnormal immunological findings in serum: Secondary | ICD-10-CM

## 2023-02-11 DIAGNOSIS — Z1231 Encounter for screening mammogram for malignant neoplasm of breast: Secondary | ICD-10-CM

## 2023-02-11 DIAGNOSIS — Z716 Tobacco abuse counseling: Secondary | ICD-10-CM

## 2023-02-11 DIAGNOSIS — G8929 Other chronic pain: Secondary | ICD-10-CM

## 2023-02-11 DIAGNOSIS — H6121 Impacted cerumen, right ear: Secondary | ICD-10-CM

## 2023-02-11 MED ORDER — NICOTINE 21 MG/24HR TD PT24
21.0000 mg | MEDICATED_PATCH | Freq: Every day | TRANSDERMAL | 0 refills | Status: DC
  Filled 2023-02-11 – 2023-02-28 (×2): qty 28, 28d supply, fill #0

## 2023-02-11 MED ORDER — FT EARWAX REMOVAL KIT 6.5 % OT SOLN
5.0000 [drp] | Freq: Two times a day (BID) | OTIC | 0 refills | Status: DC
  Filled 2023-02-11: qty 15, 30d supply, fill #0

## 2023-02-11 MED ORDER — ALBUTEROL SULFATE HFA 108 (90 BASE) MCG/ACT IN AERS
2.0000 | INHALATION_SPRAY | RESPIRATORY_TRACT | 5 refills | Status: DC | PRN
  Filled 2023-02-11: qty 18, 16d supply, fill #0
  Filled 2023-02-28: qty 18, 16d supply, fill #1
  Filled 2023-03-26: qty 18, 16d supply, fill #2

## 2023-02-11 MED ORDER — PANTOPRAZOLE SODIUM 40 MG PO TBEC
40.0000 mg | DELAYED_RELEASE_TABLET | Freq: Every day | ORAL | 0 refills | Status: DC
  Filled 2023-02-11: qty 30, 30d supply, fill #0

## 2023-02-11 MED ORDER — ROSUVASTATIN CALCIUM 5 MG PO TABS
5.0000 mg | ORAL_TABLET | Freq: Every day | ORAL | 3 refills | Status: DC
  Filled 2023-02-11 (×2): qty 90, 90d supply, fill #0

## 2023-02-11 MED ORDER — PREDNISONE 20 MG PO TABS
40.0000 mg | ORAL_TABLET | Freq: Every day | ORAL | 0 refills | Status: DC
  Filled 2023-02-11: qty 10, 5d supply, fill #0

## 2023-02-11 MED ORDER — FLUTICASONE FUROATE-VILANTEROL 100-25 MCG/ACT IN AEPB
1.0000 | INHALATION_SPRAY | Freq: Every day | RESPIRATORY_TRACT | 3 refills | Status: DC
  Filled 2023-02-11: qty 60, 30d supply, fill #0

## 2023-02-11 NOTE — Assessment & Plan Note (Addendum)
PHQ-9 score 19 GAD-7 score 8 Patient states she is not interested in counseling or medications to address this. She denies SI/HI.

## 2023-02-11 NOTE — Assessment & Plan Note (Signed)
Long-term smoker, currently smoking one pack per day since age 63. Surgeon requested nicotine cessation prior to surgery. Discussed use of nicotine patches to quit smoking and benefits of cessation for surgical outcomes and overall health. - Prescribe nicotine patch

## 2023-02-11 NOTE — Assessment & Plan Note (Signed)
63 years old with COPD. No recent mammogram or Pap smear. No pneumonia or tetanus vaccines in the last 10 years. Interested in shingles vaccine and low-dose lung cancer screening. Discussed importance of these screenings and vaccinations for preventive health. - Order cholesterol panel - Order low-dose lung cancer screening - Administer shingles vaccine - Administer Tdap vaccine - Administer pneumonia vaccine - Order mammogram - Refer to OB-GYN for Pap smear

## 2023-02-11 NOTE — Assessment & Plan Note (Signed)
Refilled Protonix

## 2023-02-11 NOTE — Assessment & Plan Note (Signed)
Intermittent sharp chest pain relieved by Tums, suggesting possible non-cardiac origin. Risk score approximately 8%. Discussed benefits of starting low-dose rosuvastatin to manage cholesterol and reduce cardiovascular risk. Explained purpose of cardiac calcium CT scan to assess atherosclerosis extent. - Prescribe low-dose rosuvastatin 5 mg - Order cardiac calcium CT scan

## 2023-02-11 NOTE — Patient Instructions (Addendum)
Make follow up appointment with GI (Dr. [name]) and inquire about recommended colon cancer screening  Please call the [facility] [phone]) to schedule a routine screening mammogram.  Recommend getting Covid booster at the pharmacy  Start Debrox in the evening 4 days before your next scheduled visit.

## 2023-02-11 NOTE — Assessment & Plan Note (Signed)
Chronic pain in hands, arms, and knees with difficulty making a fist and radiating pain. No swelling noted. Differential includes rheumatoid arthritis, osteoarthritis, and other inflammatory conditions. Blood work ordered to investigate underlying causes. Discussed potential benefits of a steroid burst to reduce inflammation and pain. - Order ANA - Order rheumatoid factor - Order sedimentation rate - Prescribe steroid burst: 2 pills/day for 5 days

## 2023-02-11 NOTE — Progress Notes (Signed)
New patient visit   Patient: Stacy Gilmore   DOB: 1960/02/19   63 y.o. Female  MRN: [id number] Visit Date: 02/11/2023  Today's healthcare provider: Sherlyn Hay, DO   Chief Complaint  Patient presents with   Establish Care   Ear Fullness    For about 2 weeks and muffled hearing   Arthritis        Subjective    KALEEYA Gilmore is a 63 y.o. female who presents today as a new patient to establish care.  HPI HPI     Ear Fullness    Additional comments: For about 2 weeks and muffled hearing        Arthritis    Additional comments:          Comments   Needs med refills      Last edited by Ashok Cordia, CMA on 02/11/2023 10:46 AM.      The patient is a 63 year old female with COPD who presents with pain in her arms, hands, and knees.  She experiences persistent pain in her arms, hands, and knees. The pain in her hands sometimes radiates into her arms, causing difficulty in making a fist and significant discomfort. No swelling is noted, but the pain can be severe, prompting her to 'beat on' her hands to improve circulation.  She has a history of "osteoporosis" in her knees, reportedly diagnosed in 2013, but has not pursued treatment. She describes difficulty moving and feeling 'like an old woman' when rising due to knee pain.  She has COPD and currently lacks her inhalers, leading to congestion and wheezing. She smokes a pack of cigarettes daily since age 88. She experiences a cough and her boyfriend describes it as snoring even when she's awake.  She underwent a sigmoidectomy in September 2024 for problems relating to diverticulitis, with a two-week hospitalization ending on October 13, 2022.  She experiences ringing in her ears and has a history of ear problems since childhood, reporting fullness and decreased hearing with significant earwax accumulation.  She feels fatigued and lacks motivation, occasionally overeating. She has a history of  taking lithium but is not interested in resuming it or other mood medications. She experiences sharp chest pain, previously evaluated in the hospital and improved with Tums. She has a history of aortic and coronary atherosclerosis.   CT abd/pelvis 01/22/23 showed: Status post sigmoid colectomy and reanastomosis, appendectomy.  Small left paraumbilical and bilateral inguinal fat hernias.    Past Medical History:  Diagnosis Date   Acute diverticulitis 06/19/2022   Allergy    Anemia    Anxiety    Arthritis    Asthma    Cellulitis of face 11/12/2022   COPD (chronic obstructive pulmonary disease) (HCC)    Depression    Diverticulitis large intestine 09/28/2022   Diverticulitis of colon 07/25/2022   Diverticulitis of intestine with abscess, recurrent 06/19/2022   GERD (gastroesophageal reflux disease)    History of diverticulitis    Osteoporosis    Past Surgical History:  Procedure Laterality Date   APPENDECTOMY N/A 10/04/2022   Procedure: APPENDECTOMY;  Surgeon: [name], MD;  Location: [facility];  Service: General;  Laterality: N/A;   CERVICAL SPINE SURGERY     CHOLECYSTECTOMY     COLON RESECTION SIGMOID N/A 10/04/2022   Procedure: COLON RESECTION SIGMOID;  Surgeon: [name], MD;  Location: [facility] OR[facility];  Service: General;  Laterality: N/A;  Splenic Flexure Takedown   LAPAROTOMY N/A 10/04/2022  Procedure: EXPLORATION LAPAROTOMY;  Surgeon: [name], MD;  Location: [facility] OR[facility];  Service: General;  Laterality: N/A;   TUBAL LIGATION     Family Status  Relation Name Status   Mother  Deceased   Father  Alive   Sister  Alive   Brother  Deceased   Nutritional therapist  (Not Specified)   MGM  Deceased   MGF  Deceased   PGM  Deceased   PGF  Deceased  No partnership data on file   Family History  Problem Relation Age of Onset   Anxiety disorder Mother    Lung cancer Mother    Thyroid disease Mother    Epilepsy Mother    Other Father        unknown medical history   Breast  cancer Sister    Cervical cancer Sister    Epilepsy Brother    Cancer Paternal Uncle    Other Maternal Grandmother        unknown medical history   Other Maternal Grandfather        unknown medical history   Diabetes Paternal Grandmother    Other Paternal Grandfather        unknown medical history   Social History   Socioeconomic History   Marital status: Single    Spouse name: Not on file   Number of children: Not on file   Years of education: Not on file   Highest education level: Not on file  Occupational History   Not on file  Tobacco Use   Smoking status: Every Day    Current packs/day: 1.00    Average packs/day: 1 pack/day for 40.0 years (40.0 ttl pk-yrs)    Types: Cigarettes    Passive exposure: Past   Smokeless tobacco: Never   Tobacco comments:    [place] Quit info given, patient down to 2 cigarettes per day  Vaping Use   Vaping status: Never Used  Substance and Sexual Activity   Alcohol use: Yes    Alcohol/week: 6.0 standard drinks of alcohol    Types: 6 Cans of beer per week    Comment: 06/19/22 drank a 40oz beer a few days ago, 1/2 of a 15 pack of beer weekly, last use ~ 02/04/22   Drug use: Not Currently    Types: "Crack" cocaine, Cocaine, Methamphetamines, Marijuana    Comment: smokes Marijuana once per week-last use 01/2022, last use crack cocaine 12/08/21   Sexual activity: Not Currently  Other Topics Concern   Not on file  Social History Narrative   Not on file   Social Drivers of Health   Financial Resource Strain: High Risk (03/19/2022)   Overall Financial Resource Strain (CARDIA)    Difficulty of Paying Living Expenses: Very hard  Food Insecurity: Food Insecurity Present (12/04/2022)   Hunger Vital Sign    Worried About Running Out of Food in the Last Year: Sometimes true    Ran Out of Food in the Last Year: Sometimes true  Transportation Needs: Unmet Transportation Needs (01/01/2023)   PRAPARE - Administrator, Civil Service (Medical):  Yes    Lack of Transportation (Non-Medical): No  Physical Activity: Inactive (03/19/2022)   Exercise Vital Sign    Days of Exercise per Week: 0 days    Minutes of Exercise per Session: 0 min  Stress: Stress Concern Present (03/19/2022)   [employer] of Occupational Health - Occupational Stress Questionnaire    Feeling of Stress : Very much  Social Connections: Socially  Isolated (03/19/2022)   Social Connection and Isolation Panel [NHANES]    Frequency of Communication with Friends and Family: Never    Frequency of Social Gatherings with Friends and Family: Never    Attends Religious Services: Never    Database administrator or Organizations: No    Attends Engineer, structural: Not on file    Marital Status: Never married   Outpatient Medications Prior to Visit  Medication Sig   senna-docusate (SENOKOT-S) 8.6-50 MG tablet Take 1 tablet by mouth at bedtime as needed for mild constipation.   [DISCONTINUED] pantoprazole (PROTONIX) 40 MG tablet Take 1 tablet (40 mg total) by mouth daily.   ibuprofen (ADVIL) 600 MG tablet Take 1 tablet (600 mg total) by mouth every 6 (six) hours as needed. (Patient not taking: Reported on 02/11/2023)   ondansetron (ZOFRAN-ODT) 4 MG disintegrating tablet Take 1 tablet (4 mg total) by mouth every 8 (eight) hours as needed for nausea or vomiting. (Patient not taking: Reported on 02/11/2023)   [DISCONTINUED] albuterol (VENTOLIN HFA) 108 (90 Base) MCG/ACT inhaler Inhale 2 puffs into the lungs every 4 (four) hours as needed for wheezing or shortness of breath. (Patient not taking: Reported on 02/11/2023)   [DISCONTINUED] fluticasone furoate-vilanterol (BREO ELLIPTA) 100-25 MCG/ACT AEPB Inhale 1 puff into the lungs daily. Rinse and brush thoroughly after administration (Patient not taking: Reported on 02/11/2023)   No facility-administered medications prior to visit.   Allergies  Allergen Reactions   Bee Venom Swelling   Penicillins Hives and Swelling     Tolerated ceftriaxone   Armoracia Rusticana Ext (Horseradish) Rash    Blisters the mouth   Other Rash    Ragu spaghetti sauce    Immunization History  Administered Date(s) Administered   Influenza,inj,Quad PF,6+ Mos 01/17/2022   PNEUMOCOCCAL CONJUGATE-20 02/11/2023   Tdap 02/11/2023   Zoster Recombinant(Shingrix) 02/11/2023    Health Maintenance  Topic Date Due   Cervical Cancer Screening (HPV/Pap Cotest)  Never done   Lung Cancer Screening  Never done   MAMMOGRAM  Never done   Colonoscopy  02/17/2023 (Originally 03/09/2006)   INFLUENZA VACCINE  04/14/2023 (Originally 08/15/2022)   COVID-19 Vaccine (1 - 2024-25 season) 09/15/2023 (Originally 09/15/2022)   Zoster Vaccines- Shingrix (2 of 2) 04/08/2023   DTaP/Tdap/Td (2 - Td or Tdap) 02/10/2033   Pneumococcal Vaccine 51-104 Years old  Completed   Hepatitis C Screening  Completed   HIV Screening  Completed   HPV VACCINES  Aged Out    Patient Care Team: Lindalou Soltis, Monico Blitz, DO as PCP - General (Family Medicine)       Objective    BP 126/78 (BP Location: Left Arm, Patient Position: Sitting, Cuff Size: Normal)   Pulse 75   Ht 5' 6.5" (1.689 m)   Wt 153 lb 11.2 oz (69.7 kg)   BMI 24.44 kg/m     Physical Exam Vitals and nursing note reviewed.  Constitutional:      General: She is awake.     Appearance: Normal appearance.  HENT:     Head: Normocephalic and atraumatic.     Right Ear: Ear canal and external ear normal. Decreased hearing noted.     Left Ear: Hearing, tympanic membrane, ear canal and external ear normal.     Ears:     Comments: Left TM normal s/p cerumen removal Eyes:     General: No scleral icterus.    Extraocular Movements: Extraocular movements intact.     Conjunctiva/sclera: Conjunctivae normal.  Pupils: Pupils are equal, round, and reactive to light.  Neck:     Thyroid: No thyromegaly or thyroid tenderness.  Cardiovascular:     Rate and Rhythm: Normal rate and regular rhythm.     Pulses: Normal  pulses.     Heart sounds: Normal heart sounds.  Pulmonary:     Effort: Pulmonary effort is normal. No tachypnea, bradypnea or respiratory distress.     Breath sounds: No stridor. Wheezing (inspiratory and expiratory) present. No rhonchi or rales.  Abdominal:     General: Bowel sounds are normal. There is no distension.     Palpations: Abdomen is soft. There is no mass.     Tenderness: There is no abdominal tenderness. There is no guarding.     Hernia: No hernia is present.  Musculoskeletal:     Cervical back: Normal range of motion and neck supple.     Right lower leg: No edema.     Left lower leg: No edema.  Lymphadenopathy:     Cervical: No cervical adenopathy.  Skin:    General: Skin is warm and dry.  Neurological:     Mental Status: She is alert and oriented to person, place, and time. Mental status is at baseline.  Psychiatric:        Mood and Affect: Mood normal.        Behavior: Behavior normal.     Depression Screen    02/11/2023   10:45 AM 03/19/2022   10:36 AM 12/11/2021    1:35 PM  PHQ 2/9 Scores  PHQ - 2 Score 4 6 2   PHQ- 9 Score 19 16 10    No results found for any visits on 02/11/23.  Assessment & Plan     Pulmonary emphysema, unspecified emphysema type (HCC) Assessment & Plan: Current symptoms of congestion and wheezing. Reports not having inhalers. Discussed importance of using prescribed inhalers to manage symptoms and prevent exacerbations. - Prescribe albuterol inhaler - Prescribe Symbicort inhaler  Orders: -     Albuterol Sulfate HFA; Inhale 2 puffs into the lungs every 4 (four) hours as needed for wheezing or shortness of breath.  Dispense: 18 g; Refill: 5 -     Fluticasone Furoate-Vilanterol; Inhale 1 puff into the lungs daily. Rinse and brush thoroughly after administration  Dispense: 60 each; Refill: 3  Encounter to establish care Assessment & Plan: 63 years old with COPD. No recent mammogram or Pap smear. No pneumonia or tetanus vaccines in the  last 10 years. Interested in shingles vaccine and low-dose lung cancer screening. Discussed importance of these screenings and vaccinations for preventive health. - Order cholesterol panel - Order low-dose lung cancer screening - Administer shingles vaccine - Administer Tdap vaccine - Administer pneumonia vaccine - Order mammogram - Refer to OB-GYN for Pap smear  Orders: -     Ambulatory referral to Obstetrics / Gynecology  Aortic atherosclerosis [employer]) Assessment & Plan: Intermittent sharp chest pain relieved by Tums, suggesting possible non-cardiac origin. Risk score approximately 8%. Discussed benefits of starting low-dose rosuvastatin to manage cholesterol and reduce cardiovascular risk. Explained purpose of cardiac calcium CT scan to assess atherosclerosis extent. - Prescribe low-dose rosuvastatin 5 mg - Order cardiac calcium CT scan  Orders: -     CT CARDIAC SCORING (SELF PAY ONLY); Future -     Rosuvastatin Calcium; Take 1 tablet (5 mg total) by mouth daily.  Dispense: 90 tablet; Refill: 3  Chronic pain of multiple joints Assessment & Plan: Chronic pain in hands, arms,  and knees with difficulty making a fist and radiating pain. No swelling noted. Differential includes rheumatoid arthritis, osteoarthritis, and other inflammatory conditions. Blood work ordered to investigate underlying causes. Discussed potential benefits of a steroid burst to reduce inflammation and pain. - Order ANA - Order rheumatoid factor - Order sedimentation rate - Prescribe steroid burst: 2 pills/day for 5 days  Orders: -     ANA 12Plus Profile, Do All RDL -     Lipid panel -     Rheumatoid factor -     Sedimentation rate -     predniSONE; Take 2 tablets (40 mg total) by mouth daily with breakfast.  Dispense: 10 tablet; Refill: 0  Moderately severe recurrent major depression (HCC) Assessment & Plan: PHQ-9 score 19 GAD-7 score 8 Patient states she is not interested in counseling or medications to  address this. She denies SI/HI.   Encounter for smoking cessation counseling Assessment & Plan: Long-term smoker, currently smoking one pack per day since age 38. Surgeon requested nicotine cessation prior to surgery. Discussed use of nicotine patches to quit smoking and benefits of cessation for surgical outcomes and overall health. - Prescribe nicotine patch   Nicotine dependence with current use Assessment & Plan: Addressed as noted above.  Orders: -     Nicotine; Place 1 patch (21 mg total) onto the skin daily.  Dispense: 28 patch; Refill: 0 -     Ambulatory Referral for Lung Cancer Scre  Gastroesophageal reflux disease without esophagitis Assessment & Plan: Refilled Protonix  Orders: -     Pantoprazole Sodium; Take 1 tablet (40 mg total) by mouth daily.  Dispense: 30 tablet; Refill: 0  Impacted cerumen of right ear -     FT Earwax Removal Kit; Place 5 drops into the right ear 2 (two) times daily. Start in the evening 4 days before visit.  Dispense: 15 mL; Refill: 0  Hearing loss of both ears due to cerumen impaction Assessment & Plan: Significant ear wax buildup causing decreased hearing and fullness. Attempted manual removal with success in the left ear and without success in the right ear. Discussed use of Debrox drops to soften earwax before follow-up irrigation. - Prescribe Debrox drops: 5 drops in right ear twice a day for 4 days before follow-up - Schedule follow-up visit for ear irrigation   Encounter for Prevnar pneumococcal vaccination -     Pneumococcal conjugate vaccine 20-valent  Encounter for screening mammogram for breast cancer -     3D Screening Mammogram, Left and Right; Future  Need for Tdap vaccination -     Tdap vaccine greater than or equal to 7yo IM  Need for shingles vaccine -     Varicella-zoster vaccine IM   Follow-up - Schedule follow-up appointment with GI for follow up and colon cancer screening - Schedule follow-up appointment for  ear irrigation   Return in about 2 weeks (around 02/25/2023).     There are other unrelated non-urgent complaints, but due to the busy schedule and the amount of time I've already spent with her, time does not permit me to address these routine issues at today's visit. I've requested another appointment to review these additional issues.  I discussed the assessment and treatment plan with the patient  The patient was provided an opportunity to ask questions and all were answered. The patient agreed with the plan and demonstrated an understanding of the instructions.   The patient was advised to call back or seek an in-person evaluation if  the symptoms worsen or if the condition fails to improve as anticipated.  Total time was 60 minutes. That includes chart review before the visit, the actual patient visit, and time spent on documentation after the visit.    Sherlyn Hay, DO  [facility] [phone] (phone) [phone] (fax)  [facility]

## 2023-02-11 NOTE — Assessment & Plan Note (Signed)
Current symptoms of congestion and wheezing. Reports not having inhalers. Discussed importance of using prescribed inhalers to manage symptoms and prevent exacerbations. - Prescribe albuterol inhaler - Prescribe Symbicort inhaler

## 2023-02-11 NOTE — Assessment & Plan Note (Signed)
Addressed as noted above.

## 2023-02-11 NOTE — Assessment & Plan Note (Signed)
Significant ear wax buildup causing decreased hearing and fullness. Attempted manual removal with success in the left ear and without success in the right ear. Discussed use of Debrox drops to soften earwax before follow-up irrigation. - Prescribe Debrox drops: 5 drops in right ear twice a day for 4 days before follow-up - Schedule follow-up visit for ear irrigation

## 2023-02-18 LAB — ANA TITER AND PATTERN
Speckled Pattern: 1:40 {titer} — ABNORMAL HIGH
Spindle Apparatus Pattern: 1:40 {titer} — ABNORMAL HIGH

## 2023-02-18 LAB — ANA 12PLUS PROFILE, DO ALL RDL
Anti-Chromatin Ab, IgG (RDL): 27 U — ABNORMAL HIGH (ref <20)
Anti-La (SS-B) Ab (RDL): <20 U (ref <20)
Anti-Ro (SS-A) Ab (RDL): <20 U (ref <20)
Anti-Scl-70 Ab (RDL): <20 U (ref <20)
Anti-Sm Ab (RDL): <20 U (ref <20)
Anti-TPO Ab (RDL): <9 [IU]/mL (ref <9.0)
Anti-U1 RNP Ab (RDL): <20 U (ref <20)
C3 Complement (RDL): 138 mg/dL (ref 90–180)
C4 Complement (RDL): <2 mg/dL — ABNORMAL LOW (ref 14–44)

## 2023-02-18 LAB — LIPID PANEL
Chol/HDL Ratio: 3.3 {ratio} (ref 0.0–4.4)
Cholesterol, Total: 176 mg/dL (ref 100–199)
HDL: 53 mg/dL (ref >39)
LDL Chol Calc (NIH): 98 mg/dL (ref 0–99)
Triglycerides: 143 mg/dL (ref 0–149)
VLDL Cholesterol Cal: 25 mg/dL (ref 5–40)

## 2023-02-18 LAB — SEDIMENTATION RATE: Sed Rate: 6 mm/h (ref 0–40)

## 2023-02-18 LAB — RHEUMATOID FACTOR: Rheumatoid fact SerPl-aCnc: 82.8 [IU]/mL — ABNORMAL HIGH (ref <14.0)

## 2023-02-21 ENCOUNTER — Other Ambulatory Visit: Payer: Self-pay

## 2023-02-21 ENCOUNTER — Other Ambulatory Visit: Payer: Self-pay | Admitting: Family Medicine

## 2023-02-21 ENCOUNTER — Telehealth: Payer: Self-pay | Admitting: Family Medicine

## 2023-02-21 DIAGNOSIS — J439 Emphysema, unspecified: Secondary | ICD-10-CM

## 2023-02-21 MED ORDER — BUDESONIDE-FORMOTEROL FUMARATE 160-4.5 MCG/ACT IN AERO
2.0000 | INHALATION_SPRAY | Freq: Two times a day (BID) | RESPIRATORY_TRACT | 3 refills | Status: DC
  Filled 2023-02-21 – 2023-02-25 (×3): qty 10.2, 30d supply, fill #0
  Filled 2023-03-26: qty 10.2, 30d supply, fill #1
  Filled 2023-04-23: qty 10.2, 30d supply, fill #2
  Filled 2023-05-26: qty 10.2, 30d supply, fill #3

## 2023-02-21 NOTE — Telephone Encounter (Signed)
 [facility] is requesting prior authorization Key: [id number] Budesonide - Formoterol  Fumarate 160- 4.5MCG/ACT Aerosol

## 2023-02-25 ENCOUNTER — Other Ambulatory Visit: Payer: Self-pay

## 2023-02-25 NOTE — Telephone Encounter (Signed)
Recieved a fax from covermymeds for //  Patient is waiting for Budesonide- Formoterol Fumarate 160- 4.5MCG/ACT Aerosol     KEY: [employer]

## 2023-02-26 ENCOUNTER — Other Ambulatory Visit: Payer: Self-pay

## 2023-02-26 ENCOUNTER — Other Ambulatory Visit (HOSPITAL_COMMUNITY): Payer: Self-pay

## 2023-02-26 MED ORDER — OMEPRAZOLE 20 MG PO CPDR
20.0000 mg | DELAYED_RELEASE_CAPSULE | Freq: Every day | ORAL | 3 refills | Status: DC
  Filled 2023-02-26: qty 30, 30d supply, fill #0
  Filled 2023-03-26: qty 90, 90d supply, fill #1

## 2023-02-26 MED ORDER — ATORVASTATIN CALCIUM 10 MG PO TABS
10.0000 mg | ORAL_TABLET | Freq: Every day | ORAL | 3 refills | Status: DC
  Filled 2023-02-26: qty 90, 90d supply, fill #0
  Filled 2023-04-23 – 2023-05-26 (×2): qty 90, 90d supply, fill #1
  Filled 2023-07-28 – 2023-09-11 (×2): qty 90, 90d supply, fill #2

## 2023-02-26 NOTE — Addendum Note (Signed)
Addended by: Jacquenette Shone on: 02/26/2023 07:45 AM   Modules accepted: Orders

## 2023-02-26 NOTE — Addendum Note (Signed)
Addended by: Jacquenette Shone on: 02/26/2023 11:31 AM   Modules accepted: Orders

## 2023-02-28 ENCOUNTER — Other Ambulatory Visit: Payer: Self-pay

## 2023-03-03 ENCOUNTER — Telehealth: Payer: Self-pay

## 2023-03-03 DIAGNOSIS — R1084 Generalized abdominal pain: Secondary | ICD-10-CM

## 2023-03-04 ENCOUNTER — Other Ambulatory Visit: Payer: Self-pay

## 2023-03-04 ENCOUNTER — Telehealth: Payer: Self-pay

## 2023-03-04 ENCOUNTER — Ambulatory Visit: Payer: Medicaid Other | Admitting: Family Medicine

## 2023-03-04 ENCOUNTER — Encounter: Payer: Self-pay | Admitting: Family Medicine

## 2023-03-04 VITALS — BP 121/86 | HR 66 | Resp 20 | Ht 66.5 in | Wt 169.0 lb

## 2023-03-04 DIAGNOSIS — H6121 Impacted cerumen, right ear: Secondary | ICD-10-CM | POA: Diagnosis not present

## 2023-03-04 DIAGNOSIS — F331 Major depressive disorder, recurrent, moderate: Secondary | ICD-10-CM | POA: Diagnosis not present

## 2023-03-04 DIAGNOSIS — J0110 Acute frontal sinusitis, unspecified: Secondary | ICD-10-CM | POA: Diagnosis not present

## 2023-03-04 DIAGNOSIS — R110 Nausea: Secondary | ICD-10-CM

## 2023-03-04 DIAGNOSIS — J439 Emphysema, unspecified: Secondary | ICD-10-CM

## 2023-03-04 DIAGNOSIS — Z716 Tobacco abuse counseling: Secondary | ICD-10-CM

## 2023-03-04 DIAGNOSIS — K432 Incisional hernia without obstruction or gangrene: Secondary | ICD-10-CM

## 2023-03-04 DIAGNOSIS — G8929 Other chronic pain: Secondary | ICD-10-CM

## 2023-03-04 DIAGNOSIS — M2550 Pain in unspecified joint: Secondary | ICD-10-CM

## 2023-03-04 DIAGNOSIS — F17211 Nicotine dependence, cigarettes, in remission: Secondary | ICD-10-CM | POA: Diagnosis not present

## 2023-03-04 MED ORDER — ONDANSETRON HCL 4 MG PO TABS
4.0000 mg | ORAL_TABLET | Freq: Three times a day (TID) | ORAL | 0 refills | Status: DC | PRN
  Filled 2023-03-04: qty 20, 7d supply, fill #0

## 2023-03-04 MED ORDER — CEFPODOXIME PROXETIL 200 MG PO TABS
200.0000 mg | ORAL_TABLET | Freq: Two times a day (BID) | ORAL | 0 refills | Status: DC
  Filled 2023-03-04: qty 14, 7d supply, fill #0

## 2023-03-04 MED ORDER — CEFDINIR 300 MG PO CAPS
300.0000 mg | ORAL_CAPSULE | Freq: Two times a day (BID) | ORAL | 0 refills | Status: DC
  Filled 2023-03-04: qty 14, 7d supply, fill #0

## 2023-03-04 NOTE — Assessment & Plan Note (Signed)
Reports a week-long history of head pressure, dizziness, and a sensation of tasting infection when swallowing. Symptoms include pain across the temples, behind the eye sockets, and a scratchy throat. Likely sinus infection contributing to ear symptoms. No known issues with cephalosporins in the past. - Prescribe cephalosporin antibiotic - Follow up in 3-4 weeks to assess improvement

## 2023-03-04 NOTE — Assessment & Plan Note (Signed)
Reports ongoing discomfort from a hernia despite using a girdle. Scheduled for surgery consultation with Dr. Claudine Mouton on February 20. Discussed the potential need for surgical intervention to alleviate symptoms. - Follow up with Dr. Claudine Mouton on February 20 for hernia surgery consultation

## 2023-03-04 NOTE — Patient Instructions (Addendum)
[facility] [address] [place], [place] [address] Phone: [phone]   [  ] Please call the [facility] [phone]) to schedule a routine screening mammogram.  [  ]  Call GI (Dr. Tobi Bastos) to schedule   [  ] Call pulmonology to scheduled lung cancer screening

## 2023-03-04 NOTE — Assessment & Plan Note (Addendum)
Quit 02/28/2023. Congratulated patient on quitting.

## 2023-03-04 NOTE — Assessment & Plan Note (Signed)
Doing well; denies shortness of breath

## 2023-03-04 NOTE — Progress Notes (Signed)
  Medicaid Managed Care   Unsuccessful Attempt Note   03/04/2023 Name: Stacy Gilmore MRN: [id number] DOB: December 05, 1960  Referred by: Sherlyn Hay, DO Reason for referral : High Risk Managed Medicaid (Health Plan Referral. )   An unsuccessful telephone outreach was attempted today. The patient was referred to the case management team for assistance with care management and care coordination.    Follow Up Plan: The Managed Medicaid care management team will reach out to the patient again over the next two days.    [facility]  [facility], [facility] Management Assistant Direct Dial: [phone]  Fax: [phone]

## 2023-03-04 NOTE — Assessment & Plan Note (Addendum)
PHQ 9 score 15 GAD 7 score 14 Does not want medication or counseling.

## 2023-03-04 NOTE — Progress Notes (Signed)
Established patient visit   Patient: Stacy Gilmore   DOB: 31-Jul-1960   63 y.o. Female  MRN: [id number] Visit Date: 03/04/2023  Today's healthcare provider: Sherlyn Hay, DO   Chief Complaint  Patient presents with   Follow-up   Headache    Pressure in the area of forehead and temples, feels like a weight on her head, ibuprofen not helping   Subjective    Headache  Associated symptoms include dizziness, sinus pressure and a sore throat (scratchy). Pertinent negatives include no abdominal pain, fever, nausea, vomiting or weakness.   Stacy Gilmore is a 63 year old female who presents with dizziness and ear discomfort.  She has been experiencing dizziness and a high-pitched whining sound in her ears for almost a week. The sensation is described as pressure across her head, which subsides when lying down but intensifies when sitting or standing. Ibuprofen has not alleviated her symptoms. She notes that swallowing feels like having sinus problems, with a taste of infection, and mentions that her ears make a 'nee' sound. No shortness of breath, but she feels 'out of it'.  She has a history of ear infections as a child, which led to the removal of her tonsils and adenoids. Recently, she used Debrox in her right ear as instructed, but the whining sound persists. She reports tenderness in her ear and a scratchy sensation when swallowing, along with sharp pains in her temples and behind her eye sockets.  She has experienced weight gain, noting an increase from 149 pounds to 163 pounds, which she attributes partly to her recent cessation of smoking. She quit smoking last Friday and is using nicotine patches to aid in quitting. She reports cravings, especially under stress, but remains committed to quitting.  There is a family history of thyroid issues, with her mother having had thyroid problems and surgery. She experiences swelling in her neck, which she suspects might be related  to her thyroid or lymph nodes.  She has a history of neck surgery in 2013, where discs 4, 5, 6, and 7 were replaced with cadaver bone. She also had her gallbladder removed, which was noted to be significantly enlarged.     Medications: Outpatient Medications Prior to Visit  Medication Sig   albuterol (VENTOLIN HFA) 108 (90 Base) MCG/ACT inhaler Inhale 2 puffs into the lungs every 4 (four) hours as needed for wheezing or shortness of breath.   atorvastatin (LIPITOR) 10 MG tablet Take 1 tablet (10 mg total) by mouth daily.   budesonide-formoterol (SYMBICORT) 160-4.5 MCG/ACT inhaler Inhale 2 puffs into the lungs 2 (two) times daily.   ibuprofen (ADVIL) 600 MG tablet Take 1 tablet (600 mg total) by mouth every 6 (six) hours as needed.   nicotine (NICODERM CQ - DOSED IN MG/24 HOURS) 21 mg/24hr patch Place 1 patch (21 mg total) onto the skin daily.   omeprazole (PRILOSEC) 20 MG capsule Take 1 capsule (20 mg total) by mouth daily.   senna-docusate (SENOKOT-S) 8.6-50 MG tablet Take 1 tablet by mouth at bedtime as needed for mild constipation.   [DISCONTINUED] carbamide peroxide (FT EARWAX REMOVAL KIT) 6.5 % OTIC solution Place 5 drops into the right ear 2 (two) times daily. Start in the evening 4 days before visit.   [DISCONTINUED] predniSONE (DELTASONE) 20 MG tablet Take 2 tablets (40 mg total) by mouth daily with breakfast.   [DISCONTINUED] ondansetron (ZOFRAN-ODT) 4 MG disintegrating tablet Take 1 tablet (4 mg total) by mouth every  8 (eight) hours as needed for nausea or vomiting. (Patient not taking: Reported on 03/04/2023)   No facility-administered medications prior to visit.    Review of Systems  Constitutional:  Negative for appetite change, chills, fatigue and fever.  HENT:  Positive for sinus pressure, sinus pain and sore throat (scratchy).   Respiratory:  Negative for chest tightness and shortness of breath.   Cardiovascular:  Negative for chest pain and palpitations.   Gastrointestinal:  Negative for abdominal pain, nausea and vomiting.  Neurological:  Positive for dizziness and headaches. Negative for weakness.        Objective    BP 121/86   Pulse 66   Resp 20   Ht 5' 6.5" (1.689 m)   Wt 169 lb (76.7 kg)   SpO2 99%   BMI 26.87 kg/m     Physical Exam Vitals reviewed.  Constitutional:      General: She is not in acute distress.    Appearance: Normal appearance. She is well-developed. She is not diaphoretic.  HENT:     Head: Normocephalic and atraumatic.     Right Ear: Ear canal and external ear normal.     Left Ear: Hearing, ear canal and external ear normal. Tympanic membrane is scarred.     Nose: Nose normal.     Mouth/Throat:     Mouth: Mucous membranes are moist.     Pharynx: Oropharynx is clear. No oropharyngeal exudate.  Eyes:     General: No scleral icterus.    Conjunctiva/sclera: Conjunctivae normal.     Pupils: Pupils are equal, round, and reactive to light.  Cardiovascular:     Rate and Rhythm: Normal rate and regular rhythm.     Pulses: Normal pulses.     Heart sounds: Normal heart sounds. No murmur heard. Pulmonary:     Effort: Pulmonary effort is normal. No respiratory distress.     Breath sounds: Normal breath sounds. No wheezing or rales.  Musculoskeletal:     Cervical back: Neck supple.     Right lower leg: No edema.     Left lower leg: No edema.  Lymphadenopathy:     Cervical: Cervical adenopathy present.  Skin:    General: Skin is warm and dry.     Findings: No rash.  Neurological:     Mental Status: She is alert.      No results found for any visits on 03/04/23.  Assessment & Plan    Impacted cerumen of right ear Assessment & Plan: Persistent earwax buildup in the right ear; has been using Debrox for four days. Reports a whining sound in the ear. Discussed flushing the ear as a next step to alleviate symptoms. - Flush the right ear  Orders: -     Ear Lavage  Moderate recurrent major depression  (HCC) Assessment & Plan: PHQ 9 score 15 GAD 7 score 14 Does not want medication or counseling.   Nicotine dependence, cigarettes, in remission Assessment & Plan: Quit 02/28/2023. Congratulated patient on quitting.   Acute non-recurrent frontal sinusitis Assessment & Plan: Reports a week-long history of head pressure, dizziness, and a sensation of tasting infection when swallowing. Symptoms include pain across the temples, behind the eye sockets, and a scratchy throat. Likely sinus infection contributing to ear symptoms. No known issues with cephalosporins in the past. - Prescribe cephalosporin antibiotic - Follow up in 3-4 weeks to assess improvement  Orders: -     Cefdinir; Take 1 capsule (300 mg total) by mouth 2 (  two) times daily.  Dispense: 14 capsule; Refill: 0  Pulmonary emphysema, unspecified emphysema type (HCC) Assessment & Plan: Doing well; denies shortness of breath   Nausea -     Ondansetron HCl; Take 1 tablet (4 mg total) by mouth every 8 (eight) hours as needed for nausea or vomiting.  Dispense: 20 tablet; Refill: 0  Chronic pain of multiple joints Assessment & Plan: Reports pain in multiple joints including hands, knees, and neck. Previous prednisone treatment for inflammation was ineffective. Scheduled rheumatology appointment on February 21. Discussed the importance of follow-up with rheumatology for further evaluation and management. - Follow up with rheumatology on February 21   Incisional hernia, without obstruction or gangrene Assessment & Plan: Reports ongoing discomfort from a hernia despite using a girdle. Scheduled for surgery consultation with Dr. Claudine Mouton on February 20. Discussed the potential need for surgical intervention to alleviate symptoms. - Follow up with Dr. Claudine Mouton on February 20 for hernia surgery consultation   Encounter for smoking cessation counseling Assessment & Plan: Recently quit smoking and is using nicotine patches. Reports  increased appetite and stress due to personal circumstances. Discussed the use of gum or lozenges as additional aids if needed. - Continue using nicotine patches - Consider gum or lozenges if needed   General Health Maintenance Multiple upcoming appointments and screenings. Mammogram not yet scheduled. Discussed the importance of staying up-to-date with routine screenings. - Schedule mammogram - Follow up with GYN for Pap smear on March 3 - Follow up with GI for colonoscopy - Follow up with lung cancer screening   Return in about 4 weeks (around 04/01/2023).      I discussed the assessment and treatment plan with the patient  The patient was provided an opportunity to ask questions and all were answered. The patient agreed with the plan and demonstrated an understanding of the instructions.   The patient was advised to call back or seek an in-person evaluation if the symptoms worsen or if the condition fails to improve as anticipated.    Sherlyn Hay, DO  [facility] [phone] (phone) [phone] (fax)  [facility]

## 2023-03-04 NOTE — Assessment & Plan Note (Signed)
Reports pain in multiple joints including hands, knees, and neck. Previous prednisone treatment for inflammation was ineffective. Scheduled rheumatology appointment on February 21. Discussed the importance of follow-up with rheumatology for further evaluation and management. - Follow up with rheumatology on February 21

## 2023-03-04 NOTE — Assessment & Plan Note (Signed)
Recently quit smoking and is using nicotine patches. Reports increased appetite and stress due to personal circumstances. Discussed the use of gum or lozenges as additional aids if needed. - Continue using nicotine patches - Consider gum or lozenges if needed

## 2023-03-04 NOTE — Assessment & Plan Note (Signed)
Persistent earwax buildup in the right ear; has been using Debrox for four days. Reports a whining sound in the ear. Discussed flushing the ear as a next step to alleviate symptoms. - Flush the right ear

## 2023-03-11 NOTE — Congregational Nurse Program (Signed)
  Dept: [phone]   Congregational Nurse Program Note  Date of Encounter: 03/11/2023 Client to [facility] day center nurse led clinic to inform this Rn about all of her upcoming appointments. She is current with her PCP. She has recently been diagnosed with Lupus and has an upcoming appointment With Dr. Gerrie Nordmann on 2/27, new Gyn apt on 3/3, PCP office on 3/17 and a follow up appointment with Dr. Claudine Mouton regarding her hernia on 3/20. She reports she does have all of her medications and has adequate transportation to these appointments.  Stacy Gilmore Past Medical History: Past Medical History:  Diagnosis Date   Acute diverticulitis 06/19/2022   Allergy    Anemia    Anxiety    Arthritis    Asthma    Cellulitis of face 11/12/2022   COPD (chronic obstructive pulmonary disease) (HCC)    Depression    Diverticulitis large intestine 09/28/2022   Diverticulitis of colon 07/25/2022   Diverticulitis of intestine with abscess, recurrent 06/19/2022   GERD (gastroesophageal reflux disease)    History of diverticulitis    Osteoporosis     Encounter Details:  Community Questionnaire - 03/11/23 0835       Questionnaire   Ask client: Do you give verbal consent for me to treat you today? Yes    Student Assistance N/A    Location Patient Served  [facility]    Encounter Setting CN site    Population Status Unknown   client now has a boarding room   Insurance Medicaid   Client now has [place] Complete health Medicaid   Insurance/Financial Assistance Referral N/A    Medication N/A   medications provided through [facility]   Medical Provider Yes    Screening Referrals Made N/A    Medical Referrals Made N/A   [facility] practice   Medical Appointment Completed N/A    CNP Interventions Advocate/Support;Educate    Screenings CN Performed N/A    ED Visit Averted N/A   no ED visit needed   Life-Saving Intervention Made N/A

## 2023-03-17 ENCOUNTER — Other Ambulatory Visit: Payer: Self-pay

## 2023-03-17 ENCOUNTER — Ambulatory Visit (INDEPENDENT_AMBULATORY_CARE_PROVIDER_SITE_OTHER): Payer: Medicaid Other | Admitting: Obstetrics and Gynecology

## 2023-03-17 ENCOUNTER — Other Ambulatory Visit (HOSPITAL_COMMUNITY)
Admission: RE | Admit: 2023-03-17 | Discharge: 2023-03-17 | Disposition: A | Discharge status: Home / Self Care | Source: Ambulatory Visit | Attending: Obstetrics and Gynecology | Admitting: Obstetrics and Gynecology

## 2023-03-17 ENCOUNTER — Encounter: Payer: Self-pay | Admitting: Obstetrics and Gynecology

## 2023-03-17 VITALS — BP 112/73 | HR 66 | Ht 66.5 in | Wt 162.0 lb

## 2023-03-17 DIAGNOSIS — Z8742 Personal history of other diseases of the female genital tract: Secondary | ICD-10-CM | POA: Diagnosis not present

## 2023-03-17 DIAGNOSIS — Z1151 Encounter for screening for human papillomavirus (HPV): Secondary | ICD-10-CM | POA: Diagnosis present

## 2023-03-17 DIAGNOSIS — Z124 Encounter for screening for malignant neoplasm of cervix: Secondary | ICD-10-CM

## 2023-03-17 DIAGNOSIS — N951 Menopausal and female climacteric states: Secondary | ICD-10-CM

## 2023-03-17 DIAGNOSIS — R6882 Decreased libido: Secondary | ICD-10-CM

## 2023-03-17 MED ORDER — VEOZAH 45 MG PO TABS
45.0000 mg | ORAL_TABLET | Freq: Every day | ORAL | 2 refills | Status: DC
  Filled 2023-03-17 – 2023-03-26 (×2): qty 30, 30d supply, fill #0

## 2023-03-17 NOTE — Patient Instructions (Signed)
 I value your feedback and you entrusting [place] with your care. If you get a [place] patient survey, I would appreciate you taking the time to let us know about your experience today. Thank you! ? ? ?

## 2023-03-17 NOTE — Progress Notes (Signed)
 Stacy Hay, DO   Chief Complaint  Patient presents with   Establish Care    Low sex drive, always feels tired.    HPI:      Ms. Stacy Gilmore is a 63 y.o. [id number] whose LMP was No LMP recorded. Patient is postmenopausal., presents today for NP eval of VS sx and due for pap smear. Last pap about 15 yrs ago; hx of abn pap with bx at that time, no treatment needed per pt. Was supposed to have repeat pap but not done.  Menopause in 50s, no PMB. Does have significant VS sx, particularly at night. Never did HRT. Interested in tx. Hx of elevated LFTs 10/24 but labs returned to normal once pt's statin changed.  Pt is rarely sexually active, just not interested. Was a couple days ago with some dryness, didn't use lubricants. No vaginal pain/bleeding. Does have some pelvic discomfort due to sex but has several hernias and s/p colon resection 9/24. Has long hx of decreased libido and fatigue. Has rheumatology appt later this month for possible RA vs auto-immune disease due to 1/25 labs with PCP.  PCP ordered mammo at 1/25 appt.    Patient Active Problem List   Diagnosis Date Noted   Vasomotor symptoms due to menopause 03/17/2023   History of abnormal cervical Pap smear 03/17/2023   Acute non-recurrent frontal sinusitis 03/04/2023   Nausea 03/04/2023   Impacted cerumen of right ear 03/04/2023   Moderate recurrent major depression (HCC) 02/11/2023   Encounter for smoking cessation counseling 02/11/2023   Hearing loss of both ears due to cerumen impaction 02/11/2023   Chronic pain of multiple joints 02/11/2023   Incisional hernia, without obstruction or gangrene 01/28/2023   Non-recurrent bilateral inguinal hernia without obstruction or gangrene 01/28/2023   Status post partial colectomy 11/21/2022   Elevated LFTs 11/12/2022   Electrolyte abnormality 11/12/2022   Hypokalemia 10/11/2022   Gastroesophageal reflux disease without esophagitis 06/27/2022   Essential hypertension  06/27/2022   Protein-calorie malnutrition, moderate (HCC) 06/27/2022   Malnutrition of moderate degree 06/24/2022   Dysuria 06/19/2022   Coronary atherosclerosis 02/07/2022   Aortic atherosclerosis (HCC) 02/07/2022   Diarrhea 02/06/2022   Cocaine use 02/06/2022   Generalized joint pain 01/17/2022   Health care maintenance 01/17/2022   Encounter to establish care 12/11/2021   History of asthma 12/11/2021   Nicotine dependence, cigarettes, in remission 12/11/2021   Chronic obstructive pulmonary disease (HCC) 12/11/2021    Past Surgical History:  Procedure Laterality Date   APPENDECTOMY N/A 10/04/2022   Procedure: APPENDECTOMY;  Surgeon: [name], MD;  Location: [facility] ORS;  Service: General;  Laterality: N/A;   CERVICAL SPINE SURGERY     CHOLECYSTECTOMY     COLON RESECTION SIGMOID N/A 10/04/2022   Procedure: COLON RESECTION SIGMOID;  Surgeon: [name], MD;  Location: [facility] ORS;  Service: General;  Laterality: N/A;  Splenic Flexure Takedown   LAPAROTOMY N/A 10/04/2022   Procedure: EXPLORATION LAPAROTOMY;  Surgeon: [name], MD;  Location: [facility] ORS;  Service: General;  Laterality: N/A;   TUBAL LIGATION      Family History  Problem Relation Age of Onset   Anxiety disorder Mother    Lung cancer Mother    Thyroid disease Mother    Epilepsy Mother    Other Father        unknown medical history   Breast cancer Sister        unknown age   Cervical cancer Sister  unknown age   Epilepsy Brother    Cancer Paternal Uncle        unknown type   Other Maternal Grandmother        unknown medical history   Other Maternal Grandfather        unknown medical history   Diabetes Paternal Grandmother    Other Paternal Grandfather        unknown medical history    Social History   Socioeconomic History   Marital status: Single    Spouse name: Not on file   Number of children: Not on file   Years of education: Not on file   Highest education level: Not on file   Occupational History   Not on file  Tobacco Use   Smoking status: Former    Current packs/day: 1.00    Average packs/day: 1 pack/day for 40.0 years (40.0 ttl pk-yrs)    Types: Cigarettes    Passive exposure: Past   Smokeless tobacco: Never   Tobacco comments:    [place] Quit info given, patient down to 2 cigarettes per day  Vaping Use   Vaping status: Never Used  Substance and Sexual Activity   Alcohol use: Yes    Alcohol/week: 6.0 standard drinks of alcohol    Types: 6 Cans of beer per week    Comment: 06/19/22 drank a 40oz beer a few days ago, 1/2 of a 15 pack of beer weekly, last use ~ 02/04/22   Drug use: Not Currently    Types: "Crack" cocaine, Cocaine, Methamphetamines, Marijuana    Comment: smokes Marijuana once per week-last use 01/2022, last use crack cocaine 12/08/21   Sexual activity: Not Currently  Other Topics Concern   Not on file  Social History Narrative   Not on file   Social Drivers of Health   Financial Resource Strain: High Risk (03/19/2022)   Overall Financial Resource Strain (CARDIA)    Difficulty of Paying Living Expenses: Very hard  Food Insecurity: Food Insecurity Present (12/04/2022)   Hunger Vital Sign    Worried About Running Out of Food in the Last Year: Sometimes true    Ran Out of Food in the Last Year: Sometimes true  Transportation Needs: Unmet Transportation Needs (01/01/2023)   PRAPARE - Administrator, Civil Service (Medical): Yes    Lack of Transportation (Non-Medical): No  Physical Activity: Inactive (03/19/2022)   Exercise Vital Sign    Days of Exercise per Week: 0 days    Minutes of Exercise per Session: 0 min  Stress: Stress Concern Present (03/19/2022)   [employer] of Occupational Health - Occupational Stress Questionnaire    Feeling of Stress : Very much  Social Connections: Socially Isolated (03/19/2022)   Social Connection and Isolation Panel [NHANES]    Frequency of Communication with Friends and Family: Never     Frequency of Social Gatherings with Friends and Family: Never    Attends Religious Services: Never    Database administrator or Organizations: No    Attends Engineer, structural: Not on file    Marital Status: Never married  Intimate Partner Violence: Not At Risk (11/15/2022)   Humiliation, Afraid, Rape, and Kick questionnaire    Fear of Current or Ex-Partner: No    Emotionally Abused: No    Physically Abused: No    Sexually Abused: No    Outpatient Medications Prior to Visit  Medication Sig Dispense Refill   albuterol (VENTOLIN HFA) 108 (90 Base) MCG/ACT inhaler  Inhale 2 puffs into the lungs every 4 (four) hours as needed for wheezing or shortness of breath. 18 g 5   atorvastatin (LIPITOR) 10 MG tablet Take 1 tablet (10 mg total) by mouth daily. 90 tablet 3   budesonide-formoterol (SYMBICORT) 160-4.5 MCG/ACT inhaler Inhale 2 puffs into the lungs 2 (two) times daily. 10.2 g 3   ibuprofen (ADVIL) 600 MG tablet Take 1 tablet (600 mg total) by mouth every 6 (six) hours as needed. 30 tablet 0   nicotine (NICODERM CQ - DOSED IN MG/24 HOURS) 21 mg/24hr patch Place 1 patch (21 mg total) onto the skin daily. 28 patch 0   omeprazole (PRILOSEC) 20 MG capsule Take 1 capsule (20 mg total) by mouth daily. 30 capsule 3   senna-docusate (SENOKOT-S) 8.6-50 MG tablet Take 1 tablet by mouth at bedtime as needed for mild constipation. 30 tablet 1   cefdinir (OMNICEF) 300 MG capsule Take 1 capsule (300 mg total) by mouth 2 (two) times daily. 14 capsule 0   ondansetron (ZOFRAN) 4 MG tablet Take 1 tablet (4 mg total) by mouth every 8 (eight) hours as needed for nausea or vomiting. 20 tablet 0   No facility-administered medications prior to visit.      ROS:  Review of Systems  Constitutional:  Negative for fever.  Gastrointestinal:  Negative for blood in stool, constipation, diarrhea, nausea and vomiting.  Genitourinary:  Negative for dyspareunia, dysuria, flank pain, frequency, hematuria,  urgency, vaginal bleeding, vaginal discharge and vaginal pain.  Musculoskeletal:  Negative for back pain.  Skin:  Negative for rash.   BREAST: No symptoms   OBJECTIVE:   Vitals:  BP 112/73   Pulse 66   Ht 5' 6.5" (1.689 m)   Wt 162 lb (73.5 kg)   BMI 25.76 kg/m   Physical Exam Vitals reviewed.  Constitutional:      Appearance: She is well-developed.  Pulmonary:     Effort: Pulmonary effort is normal.  Abdominal:     Palpations: Abdomen is soft.     Tenderness: There is abdominal tenderness in the suprapubic area. There is guarding. There is no rebound.     Hernia: A hernia is present. Hernia is present in the ventral area.  Genitourinary:    General: Normal vulva.     Pubic Area: No rash.      Labia:        Right: No rash, tenderness or lesion.        Left: No rash, tenderness or lesion.      Vagina: Normal. No vaginal discharge, erythema or tenderness.     Cervix: Normal.     Uterus: Normal. Not enlarged and not tender.      Adnexa: Right adnexa normal and left adnexa normal.       Right: No mass or tenderness.         Left: No mass or tenderness.       Comments: MOD VAG ATROPHY Musculoskeletal:        General: Normal range of motion.     Cervical back: Normal range of motion.  Skin:    General: Skin is warm and dry.  Neurological:     General: No focal deficit present.     Mental Status: She is alert and oriented to person, place, and time.  Psychiatric:        Mood and Affect: Mood normal.        Behavior: Behavior normal.        Thought  Content: Thought content normal.        Judgment: Judgment normal.    Assessment/Plan: Cervical cancer screening - Plan: Cytology - PAP  Screening for HPV (human papillomavirus) - Plan: Cytology - PAP  History of abnormal cervical Pap smear--repeat pap today. Will f/u if abn.   Vasomotor symptoms due to menopause - Plan: Fezolinetant (VEOZAH) 45 MG TABS; not candidate for HRT. Discussed veozah although [facility] most likely  won't cover. Rx eRxd to try. If is covered, pt to check CMP Qmo for 3 months, orders placed. If not covered, pt can try estroven for sx.   Decreased libido--discussed lack of tx options for sx. Could also be due to fatigue and pt being eval by rheumatology for auto-immune sx soon. F/u prn.   Pt's questions answered.   Meds ordered this encounter  Medications   Fezolinetant (VEOZAH) 45 MG TABS    Sig: Take 1 tablet (45 mg total) by mouth daily.    Dispense:  30 tablet    Refill:  2    Supervising Provider:   Hildred Laser [[id number]]      Return if symptoms worsen or fail to improve.  Chapin Arduini B. Robey Massmann, PA-C 03/17/2023 4:16 PM

## 2023-03-20 LAB — CYTOLOGY - PAP
Comment: NEGATIVE
Diagnosis: NEGATIVE
High risk HPV: NEGATIVE

## 2023-03-21 ENCOUNTER — Telehealth: Payer: Self-pay

## 2023-03-21 NOTE — Telephone Encounter (Signed)
 Stacy Gilmore

## 2023-03-23 NOTE — Telephone Encounter (Signed)
 I told pt it would most likely not be covered at her appt.

## 2023-03-26 ENCOUNTER — Other Ambulatory Visit: Payer: Self-pay | Admitting: Family Medicine

## 2023-03-26 ENCOUNTER — Other Ambulatory Visit: Payer: Self-pay

## 2023-03-26 DIAGNOSIS — R110 Nausea: Secondary | ICD-10-CM

## 2023-03-26 NOTE — Congregational Nurse Program (Signed)
  Dept: [phone]   Congregational Nurse Program Note  Date of Encounter: 03/26/2023 Client to [facility] day center for check in conversation, support an reading glasses. Client reported she has several upcoming appointments including rheumatology, GYN, PCP and general surgery. She will use Medicaid transportation and set up her own rides. She also was hoping to get her antibiotic refilled for her "sinuses". RN explained that antibiotics usually are not refilled and that she would need to speak with her PCP. She voiced understanding. Madolyn Frieze Past Medical History: Past Medical History:  Diagnosis Date   Acute diverticulitis 06/19/2022   Allergy    Anemia    Anxiety    Arthritis    Asthma    Cellulitis of face 11/12/2022   COPD (chronic obstructive pulmonary disease) (HCC)    Depression    Diverticulitis large intestine 09/28/2022   Diverticulitis of colon 07/25/2022   Diverticulitis of intestine with abscess, recurrent 06/19/2022   GERD (gastroesophageal reflux disease)    History of diverticulitis    Osteoporosis     Encounter Details:  Community Questionnaire - 03/26/23 0858       Questionnaire   Ask client: Do you give verbal consent for me to treat you today? Yes    Student Assistance N/A    Location Patient Served  [facility]    Encounter Setting CN site    Population Status Unknown   client now has a boarding room   Insurance Medicaid   Client now has [place] Medicaid   Insurance/Financial Assistance Referral N/A    Medication N/A   medications provided through [facility]   Medical Provider Yes    Screening Referrals Made N/A    Medical Referrals Made N/A   [facility] practice   Medical Appointment Completed N/A    CNP Interventions Advocate/Support;Educate    Screenings CN Performed Blood Pressure    ED Visit Averted N/A   no ED visit needed   Life-Saving Intervention Made N/A

## 2023-03-27 ENCOUNTER — Other Ambulatory Visit: Payer: Self-pay

## 2023-03-27 MED FILL — Ondansetron HCl Tab 4 MG: ORAL | 7 days supply | Qty: 20 | Fill #0 | Status: AC

## 2023-03-27 NOTE — Telephone Encounter (Signed)
 Requested medication (s) are due for refill today: yes  Requested medication (s) are on the active medication list: yes  Last refill:  no refill date  Future visit scheduled: yes  Notes to clinic:  Unable to refill per protocol, cannot delegate. Possible new Rx.      Requested Prescriptions  Pending Prescriptions Disp Refills   ondansetron (ZOFRAN) 4 MG tablet 20 tablet 0    Sig: Take 1 tablet (4 mg total) by mouth every 8 (eight) hours as needed for nausea or vomiting.     Not Delegated - Gastroenterology: Antiemetics - ondansetron Failed - 03/27/2023  8:22 AM      Failed - This refill cannot be delegated      Passed - AST in normal range and within 360 days    AST  Date Value Ref Range Status  01/22/2023 39 15 - 41 U/L Final   SGOT(AST)  Date Value Ref Range Status  04/16/2013 38 (H) 15 - 37 Unit/L Final         Passed - ALT in normal range and within 360 days    ALT  Date Value Ref Range Status  01/22/2023 42 0 - 44 U/L Final   SGPT (ALT)  Date Value Ref Range Status  04/16/2013 57 12 - 78 U/L Final         Passed - Valid encounter within last 6 months    Recent Outpatient Visits           1 month ago Pulmonary emphysema, unspecified emphysema type [facility])   [place] [facility] Pardue, Monico Blitz, DO       Future Appointments             In 4 days Pardue, Monico Blitz, DO  [facility]

## 2023-03-31 ENCOUNTER — Ambulatory Visit: Payer: Medicaid Other | Admitting: Family Medicine

## 2023-04-01 ENCOUNTER — Other Ambulatory Visit: Payer: Self-pay

## 2023-04-02 ENCOUNTER — Telehealth: Payer: Self-pay

## 2023-04-02 DIAGNOSIS — I700 Atherosclerosis of aorta: Secondary | ICD-10-CM

## 2023-04-02 DIAGNOSIS — I10 Essential (primary) hypertension: Secondary | ICD-10-CM

## 2023-04-03 ENCOUNTER — Other Ambulatory Visit: Payer: Self-pay

## 2023-04-03 ENCOUNTER — Ambulatory Visit (INDEPENDENT_AMBULATORY_CARE_PROVIDER_SITE_OTHER): Payer: Medicaid Other | Admitting: Surgery

## 2023-04-03 ENCOUNTER — Encounter: Payer: Self-pay | Admitting: Surgery

## 2023-04-03 VITALS — BP 157/74 | HR 71 | Temp 97.6°F | Ht 66.5 in | Wt 168.0 lb

## 2023-04-03 DIAGNOSIS — K4020 Bilateral inguinal hernia, without obstruction or gangrene, not specified as recurrent: Secondary | ICD-10-CM | POA: Diagnosis not present

## 2023-04-03 DIAGNOSIS — K432 Incisional hernia without obstruction or gangrene: Secondary | ICD-10-CM | POA: Diagnosis not present

## 2023-04-03 MED ORDER — IBUPROFEN 400 MG PO TABS
400.0000 mg | ORAL_TABLET | Freq: Four times a day (QID) | ORAL | 0 refills | Status: DC | PRN
  Filled 2023-04-03: qty 30, 8d supply, fill #0

## 2023-04-03 NOTE — Patient Instructions (Signed)
 We have scheduled you for a CT Scan of your Abdomen and Pelvis with contrast. This has been scheduled at Outpatient on [address] on 04/09/2023 . Please arrive there by 9:30 am (arrive by 9:15 am) . If you need to reschedule your Scan, you may do so by calling [phone]. Please let us know if you reschedule your scan as we have to get authorization from your insurance for this.    Umbilical Hernia, Adult  A hernia is a lump of tissue that pushes through an opening in the muscles. An umbilical hernia happens in the belly, near the belly button. The hernia may contain tissues from the small or large intestine. It may also have fatty tissue that covers the intestines. Umbilical hernias in adults may get worse over time. They need to be treated with surgery. There are several types of umbilical hernias. They include: Indirect hernia. This occurs just above or below the belly button. It's the most common type of umbilical hernia in adults. Direct hernia. This type occurs in an opening that's formed by the belly button. Reducible hernia. This hernia comes and goes. You may see it only when you strain, cough, or lift something heavy. This type of hernia can be pushed back into the belly (reduced). Incarcerated hernia. This traps the hernia in the wall of the belly. This type of hernia can't be pushed back into the belly. It can cause a strangulated hernia. Strangulated hernia. This hernia cuts off blood flow to the tissues inside the hernia. The tissues can die if this happens. This type of hernia must be treated right away. What are the causes? An umbilical hernia happens when tissue inside the belly pushes through an opening in the muscles of the belly. What increases the risk? You're more likely to get this hernia if: You strain while lifting or pushing heavy objects. You've had several pregnancies. You have a condition that puts pressure on your belly, and you've had it for a long time. These  include: Obesity. A buildup of fluid inside your belly. Vomiting or coughing all the time. Trouble pooping (constipation). You've had surgery that weakened the muscles in the belly. What are the signs or symptoms? The main symptom of this condition is a bulge at the belly button or near it. The bulge does not cause pain. Other symptoms depend on the type of hernia you have. A reducible hernia may be seen only when you strain, cough, or lift something heavy. Other symptoms may include: Dull pain. A feeling of pressure. An incarcerated hernia may cause very bad pain. Also, you may: Vomit or feel like you may vomit. Not be able to pass gas. A strangulated hernia may cause: Pain that gets worse and worse. Vomiting, or feeling like you may vomit. Pain when you press on the hernia. Change of color on the skin over the hernia. The skin may become red or purple. Trouble pooping. Blood in the poop. How is this diagnosed? This condition may be diagnosed based on: Your symptoms and medical history. A physical exam. You may be asked to cough or strain while standing. These actions will put pressure inside your belly. The pressure can force the hernia through the opening in your muscles. Your health care provider may try to push the hernia back into your belly (reduce). How is this treated? Surgery is the only treatment for an umbilical hernia. Surgery for a strangulated hernia must be done right away. If you have a small hernia that's  not incarcerated, you may need to lose weight before the surgery is done. Follow these instructions at home: Managing constipation You may need to take these actions to prevent trouble pooping. This will help to prevent straining. Drink enough fluid to keep your pee (urine) pale yellow. Take over-the-counter or prescription medicines. Eat foods that are high in fiber, such as beans, whole grains, and fresh fruits and vegetables. Limit foods that are high in fat  and sugars, such as fried or sweet foods. General instructions Do not try to push the hernia back in. Lose weight, if told by your provider. Watch your hernia for any changes in color or size. Tell your provider if any changes occur. You may need to avoid activities that put pressure on your hernia. You may have to avoid lifting. Ask your provider how much you can safely lift. Take over-the-counter and prescription medicines only as told by your provider. Contact a health care provider if: Your hernia gets larger or feels hard. Your hernia becomes painful. You get a fever or chills. Get help right away if: You get very bad pain near the area of the hernia, and the pain comes on suddenly. You have pain and you vomit or feel like you may vomit. The skin over your hernia changes color. These symptoms may be an emergency. Get help right away. Call 911. Do not wait to see if the symptoms go away. Do not drive yourself to the hospital. This information is not intended to replace advice given to you by your health care provider. Make sure you discuss any questions you have with your health care provider. Document Revised: 04/23/2022 Document Reviewed: 04/23/2022 Elsevier Patient Education  2024 [employer].

## 2023-04-03 NOTE — Progress Notes (Signed)
 Patient ID: Stacy Gilmore, female   DOB: 03-Jan-1961, 63 y.o.   MRN: [id number]  Chief Complaint: Hernias  History of Present Illness Stacy Gilmore is a 63 y.o. female with follow-up for known incisional hernia found on CT scan.  She was told she had bilateral inguinal hernias which are not clinically symptomatic at this time.  She was instructed to progress toward complete tobacco cessation.  She says she was down to 2 cigarettes/day, and her last 1 was 2 weeks ago.  Unfortunately however she has replaced smoking with eating and has put on some significant weight since prior.  Seems to be all abdominal.  She presents today wearing her abdominal binder.  She has a rather prominent bulge that is from the upper aspect of her incision seemingly cephalad from her umbilicus.  This has been giving her pain.  She coughed during our interview in supine position and has a significant hernia sac. She reports she does not have her own phone and must be contacted via a neighbor.   Past Medical History Past Medical History:  Diagnosis Date   Acute diverticulitis 06/19/2022   Allergy    Anemia    Anxiety    Arthritis    Asthma    Cellulitis of face 11/12/2022   COPD (chronic obstructive pulmonary disease) (HCC)    Depression    Diverticulitis large intestine 09/28/2022   Diverticulitis of colon 07/25/2022   Diverticulitis of intestine with abscess, recurrent 06/19/2022   GERD (gastroesophageal reflux disease)    History of diverticulitis    Osteoporosis       Past Surgical History:  Procedure Laterality Date   APPENDECTOMY N/A 10/04/2022   Procedure: APPENDECTOMY;  Surgeon: [name], MD;  Location: [facility] ORS;  Service: General;  Laterality: N/A;   CERVICAL SPINE SURGERY     CHOLECYSTECTOMY     COLON RESECTION SIGMOID N/A 10/04/2022   Procedure: COLON RESECTION SIGMOID;  Surgeon: [name], MD;  Location: [facility] ORS;  Service: General;  Laterality: N/A;  Splenic Flexure  Takedown   LAPAROTOMY N/A 10/04/2022   Procedure: EXPLORATION LAPAROTOMY;  Surgeon: [name], MD;  Location: [facility] ORS;  Service: General;  Laterality: N/A;   TUBAL LIGATION      Allergies  Allergen Reactions   Bee Venom Swelling   Penicillins Hives and Swelling    Tolerated ceftriaxone   Armoracia Rusticana Ext (Horseradish) Rash    Blisters the mouth   Other Rash    Ragu spaghetti sauce    Current Outpatient Medications  Medication Sig Dispense Refill   atorvastatin (LIPITOR) 10 MG tablet Take 1 tablet (10 mg total) by mouth daily. 90 tablet 3   budesonide-formoterol (SYMBICORT) 160-4.5 MCG/ACT inhaler Inhale 2 puffs into the lungs 2 (two) times daily. 10.2 g 3   Fezolinetant (VEOZAH) 45 MG TABS Take 1 tablet (45 mg total) by mouth daily. 30 tablet 2   ibuprofen (ADVIL) 600 MG tablet Take 1 tablet (600 mg total) by mouth every 6 (six) hours as needed. 30 tablet 0   nicotine (NICODERM CQ - DOSED IN MG/24 HOURS) 21 mg/24hr patch Place 1 patch (21 mg total) onto the skin daily. 28 patch 0   omeprazole (PRILOSEC) 20 MG capsule Take 1 capsule (20 mg total) by mouth daily. 30 capsule 3   ondansetron (ZOFRAN) 4 MG tablet Take 1 tablet (4 mg total) by mouth every 8 (eight) hours as needed for nausea or vomiting. 20 tablet 0   senna-docusate (SENOKOT-S) 8.6-50  MG tablet Take 1 tablet by mouth at bedtime as needed for mild constipation. 30 tablet 1   No current facility-administered medications for this visit.    Family History Family History  Problem Relation Age of Onset   Anxiety disorder Mother    Lung cancer Mother    Thyroid disease Mother    Epilepsy Mother    Other Father        unknown medical history   Breast cancer Sister        unknown age   Cervical cancer Sister        unknown age   Epilepsy Brother    Cancer Paternal Uncle        unknown type   Other Maternal Grandmother        unknown medical history   Other Maternal Grandfather        unknown medical  history   Diabetes Paternal Grandmother    Other Paternal Grandfather        unknown medical history      Social History Social History   Tobacco Use   Smoking status: Former    Current packs/day: 1.00    Average packs/day: 1 pack/day for 40.0 years (40.0 ttl pk-yrs)    Types: Cigarettes    Passive exposure: Past   Smokeless tobacco: Never   Tobacco comments:    [place] Quit info given, patient down to 2 cigarettes per day  Vaping Use   Vaping status: Never Used  Substance Use Topics   Alcohol use: Yes    Alcohol/week: 6.0 standard drinks of alcohol    Types: 6 Cans of beer per week    Comment: 06/19/22 drank a 40oz beer a few days ago, 1/2 of a 15 pack of beer weekly, last use ~ 02/04/22   Drug use: Not Currently    Types: "Crack" cocaine, Cocaine, Methamphetamines, Marijuana    Comment: smokes Marijuana once per week-last use 01/2022, last use crack cocaine 12/08/21        Review of Systems  All other systems reviewed and are negative.    Physical Exam Blood pressure (!) 157/74, pulse 71, temperature 97.6 F (36.4 C), temperature source Oral, height 5' 6.5" (1.689 m), weight 168 lb (76.2 kg), SpO2 96%. Last Weight  Most recent update: 04/03/2023  9:05 AM    Weight  76.2 kg (168 lb)             CONSTITUTIONAL: Well developed, and nourished, appropriately responsive and aware without distress.   EYES: Sclera non-icteric.   EARS, NOSE, MOUTH AND THROAT:  The oropharynx is clear. Oral mucosa is pink and moist.    Hearing is intact to voice.  NECK: Trachea is midline, and there is no jugular venous distension.  LYMPH NODES:  Lymph nodes in the neck are not appreciated. RESPIRATORY:  Lungs are clear, and breath sounds are equal bilaterally.  Normal respiratory effort without pathologic use of accessory muscles. CARDIOVASCULAR: Heart is regular in rate and rhythm.   Well perfused.  GI: The abdomen is notable for a prominent bulge in the supraumbilical area, [place] and  readily reducible.  Estimated defect diameter 5 cm.  Otherwise soft, nontender, and nondistended. There were no palpable masses.  I did not appreciate ascites, though her abdomen is more protuberant as though she had some degree of it.  GU: On prior exam, not repeated today: Both groins were tender with Valsalva, but without prominent bulge. MUSCULOSKELETAL:  Symmetrical muscle tone appreciated in all  four extremities.    SKIN: Skin turgor is normal. No pathologic skin lesions appreciated.  NEUROLOGIC:  Motor and sensation appear grossly normal.  Cranial nerves are grossly without defect. PSYCH:  Alert and oriented to person, place and time. Affect is appropriate for situation.  Data Reviewed I have personally reviewed what is currently available of the patient's imaging, recent labs and medical records.   Labs:     Latest Ref Rng & Units 01/22/2023    4:24 AM 11/13/2022    5:01 AM 11/12/2022   12:19 PM  CBC  WBC 4.0 - 10.5 K/uL 7.2  5.2  8.0   Hemoglobin 12.0 - 15.0 g/dL 13.2  44.0  10.2   Hematocrit 36.0 - 46.0 % 45.5  35.3  40.6   Platelets 150 - 400 K/uL 305  220  282       Latest Ref Rng & Units 01/22/2023    4:24 AM 11/14/2022    9:38 AM 11/14/2022    8:12 AM  CMP  Glucose 70 - 99 mg/dL 91   725   BUN 8 - 23 mg/dL 11   7   Creatinine 3.66 - 1.00 mg/dL 4.40   3.47   Sodium 425 - 145 mmol/L 141   137   Potassium 3.5 - 5.1 mmol/L 4.1   4.5   Chloride 98 - 111 mmol/L 106   102   CO2 22 - 32 mmol/L 24   27   Calcium 8.9 - 10.3 mg/dL 8.8   8.8   Total Protein 6.5 - 8.1 g/dL 7.5  6.4    Total Bilirubin 0.0 - 1.2 mg/dL 0.5  0.3    Alkaline Phos 38 - 126 U/L 66  68    AST 15 - 41 U/L 39  94    ALT 0 - 44 U/L 42  139     Imaging: Radiological images reviewed:  CLINICAL DATA:  Abdominal pain. Bowel obstruction suspected, with pain and swelling onset 3 days ago. Appendectomy performed last fall. Sigmoid colectomy with primary anastomosis. Both done 10/04/2022.   EXAM: CT  ABDOMEN AND PELVIS WITH CONTRAST   TECHNIQUE: Multidetector CT imaging of the abdomen and pelvis was performed using the standard protocol following bolus administration of intravenous contrast.   RADIATION DOSE REDUCTION: This exam was performed according to the departmental dose-optimization program which includes automated exposure control, adjustment of the mA and/or kV according to patient size and/or use of iterative reconstruction technique.   CONTRAST:  OMNIPAQUE IOHEXOL 300 MG/ML  SOLN   COMPARISON:  Preoperative CT with IV contrast 10/02/2022, 09/28/2022   FINDINGS: Lower chest: No acute abnormality.   Hepatobiliary: No focal liver abnormality is seen. Status post cholecystectomy. No biliary dilatation.   Pancreas: No abnormality.   Spleen: No abnormality.  No splenomegaly.   Adrenals/Urinary Tract: Adrenal glands are unremarkable. Kidneys are normal, without renal calculi, focal lesion, or hydronephrosis. Bladder is unremarkable.   Stomach/Bowel: Contracted stomach with chronic thickened folds. Normal caliber small bowel with scattered segments showing mucosal enhancement without thickening, mild fluid filling.   Findings consistent with nonspecific enteritis without inflammatory changes.   Status post appendectomy. Status post sigmoid colectomy and reanastomosis.   Few scattered colonic diverticula noted without acute inflammatory change. No overt wall thickening.   Vascular/Lymphatic: There is aortic atherosclerosis without AAA or dissection. There are no enlarged lymph nodes.   Reproductive: Uterus and bilateral adnexa are unremarkable.   Other: Small left paraumbilical and bilateral inguinal fat  hernias. No incarcerated hernia. No free fluid, free air, free hemorrhage focal inflammatory process.   Musculoskeletal: Osteopenia and mild degenerative change of the spine. No acute or other significant osseous findings.   IMPRESSION: 1. Imaging  findings of nonspecific enteritis without inflammatory changes. 2. Contracted stomach with chronic thickened folds. 3. Status post sigmoid colectomy and reanastomosis, appendectomy. 4. Aortic atherosclerosis. 5. Small left paraumbilical and bilateral inguinal fat hernias. 6. Osteopenia and degenerative change.   Aortic Atherosclerosis (ICD10-I70.0).     Electronically Signed   By: Almira Bar M.D.   On: 01/22/2023 07:05   Within last 24 hrs: No results found.  Assessment    Incisional hernia. Bilateral fat filled inguinal defects.  Patient Active Problem List   Diagnosis Date Noted   Vasomotor symptoms due to menopause 03/17/2023   History of abnormal cervical Pap smear 03/17/2023   Acute non-recurrent frontal sinusitis 03/04/2023   Nausea 03/04/2023   Impacted cerumen of right ear 03/04/2023   Moderate recurrent major depression (HCC) 02/11/2023   Encounter for smoking cessation counseling 02/11/2023   Hearing loss of both ears due to cerumen impaction 02/11/2023   Chronic pain of multiple joints 02/11/2023   Incisional hernia, without obstruction or gangrene 01/28/2023   Non-recurrent bilateral inguinal hernia without obstruction or gangrene 01/28/2023   Status post partial colectomy 11/21/2022   Elevated LFTs 11/12/2022   Electrolyte abnormality 11/12/2022   Hypokalemia 10/11/2022   Gastroesophageal reflux disease without esophagitis 06/27/2022   Essential hypertension 06/27/2022   Protein-calorie malnutrition, moderate (HCC) 06/27/2022   Malnutrition of moderate degree 06/24/2022   Dysuria 06/19/2022   Coronary atherosclerosis 02/07/2022   Aortic atherosclerosis (HCC) 02/07/2022   Diarrhea 02/06/2022   Cocaine use 02/06/2022   Generalized joint pain 01/17/2022   Health care maintenance 01/17/2022   Encounter to establish care 12/11/2021   History of asthma 12/11/2021   Nicotine dependence, cigarettes, in remission 12/11/2021   Chronic obstructive pulmonary  disease (HCC) 12/11/2021    Plan    Her prior abdominal binder is rather challenging to put on, thanks to the recent weight gain.  Refill at her request, the ibuprofen to assist with the abdominal wall pain.  Due to a conflict with court dates in the next month, we will have her follow-up in a month to confirm continued smoking cessation, encouraged her to reign in her appetite and pursue some degree of weight loss.  And anticipate scheduling surgery at that point to avoid conflict with her court appearance.  I discussed possibility of incarceration, strangulation, enlargement in size over time, and the need for emergency surgery in the face of these.  Also reviewed the techniques of reduction should incarceration occur, and when unsuccessful to present to the ED.  Also discussed that surgery risks include recurrence which can be up to 30% in the case of complex hernias, use of prosthetic materials (mesh) and the increased risk of infection and the possible need for re-operation and removal of mesh, possibility of post-op SBO or ileus, and the risks of general anesthetic including heart attack, stroke, sudden death or some reaction to anesthetic medications. The patient, and those present, appear to understand the risks, any and all questions were answered to the patient's satisfaction.  No guarantees were ever expressed or implied.  Will have her back in a month, or as needed, hopefully she will continue her course with tobacco avoidance.   Face-to-face time spent with the patient and accompanying care providers(if present) was 20 minutes, with more  than 50% of the time spent counseling, educating, and coordinating care of the patient.    These notes generated with voice recognition software. I apologize for typographical errors.  [name] M.D., FACS 04/03/2023, 9:41 AM

## 2023-04-09 ENCOUNTER — Ambulatory Visit
Admission: RE | Admit: 2023-04-09 | Discharge: 2023-04-09 | Disposition: A | Discharge status: Home / Self Care | Source: Ambulatory Visit | Attending: Surgery | Admitting: Surgery

## 2023-04-09 DIAGNOSIS — K432 Incisional hernia without obstruction or gangrene: Secondary | ICD-10-CM | POA: Diagnosis present

## 2023-04-09 MED ORDER — IOHEXOL 300 MG/ML  SOLN
100.0000 mL | Freq: Once | INTRAMUSCULAR | Status: AC | PRN
  Administered 2023-04-09: 100 mL via INTRAVENOUS

## 2023-04-14 ENCOUNTER — Other Ambulatory Visit: Payer: Self-pay

## 2023-04-14 ENCOUNTER — Ambulatory Visit: Admitting: Obstetrics and Gynecology

## 2023-04-15 ENCOUNTER — Encounter: Payer: Self-pay | Admitting: Obstetrics and Gynecology

## 2023-04-17 ENCOUNTER — Encounter: Payer: Self-pay | Admitting: *Deleted

## 2023-04-23 ENCOUNTER — Other Ambulatory Visit: Payer: Self-pay

## 2023-04-23 ENCOUNTER — Other Ambulatory Visit: Payer: Self-pay | Admitting: Family Medicine

## 2023-04-23 DIAGNOSIS — R110 Nausea: Secondary | ICD-10-CM

## 2023-04-23 NOTE — Congregational Nurse Program (Signed)
  Dept: [phone]   Congregational Nurse Program Note  Date of Encounter: 04/23/2023 Client to [facility] day center, nurse led clinic with request for [place] bus pass so she could pick up her medications at the [facility] pharmacy. She has up coming appointments and will make those transportation arrangements through her medicaid. No other needs at this time. Madolyn Frieze Past Medical History: Past Medical History:  Diagnosis Date   Acute diverticulitis 06/19/2022   Allergy    Anemia    Anxiety    Arthritis    Asthma    Cellulitis of face 11/12/2022   COPD (chronic obstructive pulmonary disease) (HCC)    Depression    Diverticulitis large intestine 09/28/2022   Diverticulitis of colon 07/25/2022   Diverticulitis of intestine with abscess, recurrent 06/19/2022   GERD (gastroesophageal reflux disease)    History of diverticulitis    Osteoporosis     Encounter Details:  Community Questionnaire - 04/23/23 1256       Questionnaire   Ask client: Do you give verbal consent for me to treat you today? Yes    Student Assistance N/A    Location Patient Served  [facility]    Encounter Setting CN site    Population Status Unknown   client now has a boarding room   Insurance Medicaid   Client now has [place] Medicaid   Insurance/Financial Assistance Referral N/A    Medication N/A   medications provided through [facility]   Medical Provider Yes    Screening Referrals Made N/A    Medical Referrals Made N/A   [facility] practice   Medical Appointment Completed N/A    CNP Interventions Advocate/Support    Screenings CN Performed N/A    ED Visit Averted N/A   no ED visit needed   Life-Saving Intervention Made N/A

## 2023-04-24 ENCOUNTER — Other Ambulatory Visit: Payer: Self-pay | Admitting: Family Medicine

## 2023-04-24 ENCOUNTER — Other Ambulatory Visit: Payer: Self-pay

## 2023-04-24 DIAGNOSIS — R110 Nausea: Secondary | ICD-10-CM

## 2023-04-24 DIAGNOSIS — J439 Emphysema, unspecified: Secondary | ICD-10-CM

## 2023-04-24 MED FILL — Ondansetron HCl Tab 4 MG: ORAL | 7 days supply | Qty: 20 | Fill #0 | Status: AC

## 2023-04-24 MED FILL — Albuterol Sulfate Inhal Aero 108 MCG/ACT (90MCG Base Equiv): RESPIRATORY_TRACT | 16 days supply | Qty: 18 | Fill #0 | Status: AC

## 2023-04-24 NOTE — Telephone Encounter (Signed)
 Requested medication (s) are due for refill today: yes  Requested medication (s) are on the active medication list: Albuterol: NO   Zofran: yes  Last refill:  zofran: 03/27/23 #20       Albuterol 02/11/23 18 g  Future visit scheduled: no  Notes to clinic:  Zofran: not delegated to NT to RF   Albuterol not on AML   Requested Prescriptions  Pending Prescriptions Disp Refills   ondansetron (ZOFRAN) 4 MG tablet 20 tablet 0    Sig: Take 1 tablet (4 mg total) by mouth every 8 (eight) hours as needed for nausea or vomiting.     Not Delegated - Gastroenterology: Antiemetics - ondansetron Failed - 04/24/2023  4:01 PM      Failed - This refill cannot be delegated      Passed - AST in normal range and within 360 days    AST  Date Value Ref Range Status  01/22/2023 39 15 - 41 U/L Final   SGOT(AST)  Date Value Ref Range Status  04/16/2013 38 (H) 15 - 37 Unit/L Final         Passed - ALT in normal range and within 360 days    ALT  Date Value Ref Range Status  01/22/2023 42 0 - 44 U/L Final   SGPT (ALT)  Date Value Ref Range Status  04/16/2013 57 12 - 78 U/L Final         Passed - Valid encounter within last 6 months    Recent Outpatient Visits           1 month ago Impacted cerumen of right ear   [facility] [place], Sarah N, DO               VENTOLIN HFA [phone] Base) MCG/ACT inhaler [Pharmacy Med Name: albuterol (VENTOLIN HFA) 108 (90 Base) MCG/ACT inhaler] 18 g 5    Sig: Inhale 2 puffs into the lungs every 4 (four) hours as needed for wheezing or shortness of breath.     Pulmonology:  Beta Agonists 2 Failed - 04/24/2023  4:01 PM      Failed - Last BP in normal range    BP Readings from Last 1 Encounters:  04/03/23 (!) 157/74         Passed - Last Heart Rate in normal range    Pulse Readings from Last 1 Encounters:  04/03/23 71         Passed - Valid encounter within last 12 months    Recent Outpatient Visits           1 month ago  Impacted cerumen of right ear   [facility] [place], Monico Blitz, DO

## 2023-04-25 ENCOUNTER — Other Ambulatory Visit: Payer: Self-pay

## 2023-04-28 ENCOUNTER — Other Ambulatory Visit: Payer: Self-pay

## 2023-04-30 ENCOUNTER — Other Ambulatory Visit: Payer: Self-pay

## 2023-04-30 ENCOUNTER — Emergency Department

## 2023-04-30 ENCOUNTER — Emergency Department
Admission: EM | Admit: 2023-04-30 | Discharge: 2023-04-30 | Disposition: A | Discharge status: Home / Self Care | Attending: Emergency Medicine | Admitting: Emergency Medicine

## 2023-04-30 DIAGNOSIS — R1032 Left lower quadrant pain: Secondary | ICD-10-CM | POA: Diagnosis present

## 2023-04-30 DIAGNOSIS — K439 Ventral hernia without obstruction or gangrene: Secondary | ICD-10-CM

## 2023-04-30 DIAGNOSIS — R1084 Generalized abdominal pain: Secondary | ICD-10-CM

## 2023-04-30 DIAGNOSIS — R112 Nausea with vomiting, unspecified: Secondary | ICD-10-CM

## 2023-04-30 LAB — CBC
HCT: 44.5 % (ref 36.0–46.0)
Hemoglobin: 14.7 g/dL (ref 12.0–15.0)
MCH: 28.8 pg (ref 26.0–34.0)
MCHC: 33 g/dL (ref 30.0–36.0)
MCV: 87.3 fL (ref 80.0–100.0)
Platelets: 266 10*3/uL (ref 150–400)
RBC: 5.1 MIL/uL (ref 3.87–5.11)
RDW: 12.2 % (ref 11.5–15.5)
WBC: 7 10*3/uL (ref 4.0–10.5)
nRBC: 0 % (ref 0.0–0.2)

## 2023-04-30 LAB — LACTIC ACID, PLASMA: Lactic Acid, Venous: 1.7 mmol/L (ref 0.5–1.9)

## 2023-04-30 LAB — URINALYSIS, ROUTINE W REFLEX MICROSCOPIC
Bilirubin Urine: NEGATIVE
Glucose, UA: NEGATIVE mg/dL
Hgb urine dipstick: NEGATIVE
Ketones, ur: NEGATIVE mg/dL
Leukocytes,Ua: NEGATIVE
Nitrite: NEGATIVE
Protein, ur: NEGATIVE mg/dL
Specific Gravity, Urine: 1.005 (ref 1.005–1.030)
pH: 5 (ref 5.0–8.0)

## 2023-04-30 LAB — COMPREHENSIVE METABOLIC PANEL WITH GFR
ALT: 86 U/L — ABNORMAL HIGH (ref 0–44)
AST: 67 U/L — ABNORMAL HIGH (ref 15–41)
Albumin: 4 g/dL (ref 3.5–5.0)
Alkaline Phosphatase: 70 U/L (ref 38–126)
Anion gap: 9 (ref 5–15)
BUN: 9 mg/dL (ref 8–23)
CO2: 22 mmol/L (ref 22–32)
Calcium: 8.9 mg/dL (ref 8.9–10.3)
Chloride: 105 mmol/L (ref 98–111)
Creatinine, Ser: 0.46 mg/dL (ref 0.44–1.00)
GFR, Estimated: >60 mL/min (ref >60)
Glucose, Bld: 96 mg/dL (ref 70–99)
Potassium: 3.6 mmol/L (ref 3.5–5.1)
Sodium: 136 mmol/L (ref 135–145)
Total Bilirubin: 0.8 mg/dL (ref 0.0–1.2)
Total Protein: 7.6 g/dL (ref 6.5–8.1)

## 2023-04-30 LAB — LIPASE, BLOOD: Lipase: 70 U/L — ABNORMAL HIGH (ref 11–51)

## 2023-04-30 MED ORDER — ACETAMINOPHEN 500 MG PO TABS
1000.0000 mg | ORAL_TABLET | Freq: Once | ORAL | Status: AC
  Administered 2023-04-30: 1000 mg via ORAL
  Filled 2023-04-30: qty 2

## 2023-04-30 MED ORDER — OXYCODONE HCL 5 MG PO TABS
5.0000 mg | ORAL_TABLET | Freq: Once | ORAL | Status: AC
  Administered 2023-04-30: 5 mg via ORAL
  Filled 2023-04-30: qty 1

## 2023-04-30 MED ORDER — LIDOCAINE 5 % EX PTCH
1.0000 | MEDICATED_PATCH | Freq: Two times a day (BID) | CUTANEOUS | 1 refills | Status: DC
  Filled 2023-04-30: qty 3, 3d supply, fill #0

## 2023-04-30 MED ORDER — LIDOCAINE 5 % EX PTCH
1.0000 | MEDICATED_PATCH | CUTANEOUS | Status: DC
  Administered 2023-04-30: 1 via TRANSDERMAL
  Filled 2023-04-30: qty 1

## 2023-04-30 MED ORDER — LACTATED RINGERS IV BOLUS
1000.0000 mL | Freq: Once | INTRAVENOUS | Status: AC
  Administered 2023-04-30: 1000 mL via INTRAVENOUS

## 2023-04-30 MED ORDER — IOHEXOL 300 MG/ML  SOLN
100.0000 mL | Freq: Once | INTRAMUSCULAR | Status: AC | PRN
  Administered 2023-04-30: 100 mL via INTRAVENOUS

## 2023-04-30 MED ORDER — GABAPENTIN 100 MG PO CAPS
100.0000 mg | ORAL_CAPSULE | Freq: Three times a day (TID) | ORAL | 0 refills | Status: DC | PRN
  Filled 2023-04-30: qty 20, 7d supply, fill #0

## 2023-04-30 MED ORDER — VALACYCLOVIR HCL 1 G PO TABS
1000.0000 mg | ORAL_TABLET | Freq: Two times a day (BID) | ORAL | 0 refills | Status: DC
  Filled 2023-04-30: qty 14, 7d supply, fill #0

## 2023-04-30 MED ORDER — MORPHINE SULFATE (PF) 4 MG/ML IV SOLN
4.0000 mg | Freq: Once | INTRAVENOUS | Status: AC
  Administered 2023-04-30: 4 mg via INTRAVENOUS
  Filled 2023-04-30: qty 1

## 2023-04-30 MED ORDER — VALACYCLOVIR HCL 500 MG PO TABS
1000.0000 mg | ORAL_TABLET | Freq: Once | ORAL | Status: AC
  Administered 2023-04-30: 1000 mg via ORAL
  Filled 2023-04-30: qty 2

## 2023-04-30 MED ORDER — KETOROLAC TROMETHAMINE 30 MG/ML IJ SOLN
15.0000 mg | Freq: Once | INTRAMUSCULAR | Status: AC
  Administered 2023-04-30: 15 mg via INTRAVENOUS
  Filled 2023-04-30: qty 1

## 2023-04-30 MED ORDER — ONDANSETRON HCL 4 MG/2ML IJ SOLN
4.0000 mg | Freq: Once | INTRAMUSCULAR | Status: AC
  Administered 2023-04-30: 4 mg via INTRAVENOUS
  Filled 2023-04-30: qty 2

## 2023-04-30 MED ORDER — GABAPENTIN 100 MG PO CAPS
100.0000 mg | ORAL_CAPSULE | Freq: Once | ORAL | Status: AC
  Administered 2023-04-30: 100 mg via ORAL
  Filled 2023-04-30: qty 1

## 2023-04-30 NOTE — ED Provider Notes (Signed)
 Patient received in signout from Dr. Karlynn Oyster pending CT.  As below her CT demonstrates her known large ventral hernia without acute features.  On exam I visualized 2 tiny flat red skin lesions, no vesicles, but her location of pain makes me question MSK pain, shingles more than hernia related pain.  Pain improving with gabapentin and oral analgesics, lidocaine patch.  I discussed the case with general surgery, as below who is in agreement with plan of care.  Patient has follow-up with Dr. Ofilia Benton next week she reports.  I believe she is suitable for outpatient management.  Her hernia remains soft and reducible.  Discussed ED return precautions.  Clinical Course as of 04/30/23 1857  Wed Apr 30, 2023  1202 Lipase(!): 70 Decreased from prior level [DW]  1756 Patient has required a third dose of IV morphine and still uncomfortable.  Her CT results without acute features.  I reevaluated her and she has a soft and reducible large ventral hernia.  Has some tenderness extending past the hernia sac towards the left.  All of her pain is periumbilical and towards the left.  I certainly question shingles but she does not have a clear rash she has.  Due to ongoing pain and some uncertainty of the etiology I do consult with general surgery, Dr. Rosea Conch.  We discussed the case, presentation and my concerns.  He will review imaging further and get back to me. [DS]    Clinical Course User Index [DS] Arline Bennett, MD [DW] Kandee Orion, MD       Arline Bennett, MD 04/30/23 [phone]

## 2023-04-30 NOTE — ED Provider Notes (Signed)
 [facility] Provider Note    Event Date/Time   First MD Initiated Contact with Patient 04/30/23 1038     (approximate)   History   Abdominal Pain   HPI Stacy Gilmore is a 63 y.o. female with history of diverticulitis, incisional hernia presenting today for abdominal pain.  Patient states for the past 2 days she has had worsening pain around her known incisional hernia as well as worsening left lower quadrant pain.  She has had associated nausea and vomiting with this.  Mild diarrhea as well.  No blood in the vomit or stools.  Denies dysuria.  No chest pain or shortness of breath.  No fevers.  Chart review: Patient had recent visit with her general surgeon approximately 1 month ago for her known incisional hernia.  No sign of obstruction or incarceration at that point.  Eventual plan for outpatient surgery in the future.     Physical Exam   Triage Vital Signs: ED Triage Vitals  Encounter Vitals Group     BP 04/30/23 1032 (!) 154/94     Systolic BP Percentile --      Diastolic BP Percentile --      Pulse Rate 04/30/23 1032 76     Resp 04/30/23 1032 18     Temp 04/30/23 1032 97.7 F (36.5 C)     Temp Source 04/30/23 1032 Oral     SpO2 04/30/23 1032 95 %     Weight 04/30/23 1033 167 lb 8.8 oz (76 kg)     Height 04/30/23 1033 5\' 6"  (1.676 m)     Head Circumference --      Peak Flow --      Pain Score 04/30/23 1033 8     Pain Loc --      Pain Education --      Exclude from Growth Chart --     Most recent vital signs: Vitals:   04/30/23 1445 04/30/23 1453  BP: 137/77   Pulse: 70   Resp: 14   Temp:  97.9 F (36.6 C)  SpO2: 95%    Physical Exam: I have reviewed the vital signs and nursing notes. General: Awake, alert, no acute distress.  Nontoxic appearing. Head:  Atraumatic, normocephalic.   ENT:  EOM intact, PERRL. Oral mucosa is pink and moist with no lesions. Neck: Neck is supple with full range of motion, No meningeal  signs. Cardiovascular:  RRR, No murmurs. Peripheral pulses palpable and equal bilaterally. Respiratory:  Symmetrical chest wall expansion.  No rhonchi, rales, or wheezes.  Good air movement throughout.  No use of accessory muscles.   Musculoskeletal:  No cyanosis or edema. Moving extremities with full ROM Abdomen:  Soft, large midline incisional hernia present.  It does appear easily reducible with no skin discoloration no significant pain with pressing.  Does have tenderness to palpation left lower quadrant. Neuro:  GCS 15, moving all four extremities, interacting appropriately. Speech clear. Psych:  Calm, appropriate.   Skin:  Warm, dry, no rash.    ED Results / Procedures / Treatments   Labs (all labs ordered are listed, but only abnormal results are displayed) Labs Reviewed  LIPASE, BLOOD - Abnormal; Notable for the following components:      Result Value   Lipase 70 (*)    All other components within normal limits  COMPREHENSIVE METABOLIC PANEL WITH GFR - Abnormal; Notable for the following components:   AST 67 (*)    ALT 86 (*)  All other components within normal limits  URINALYSIS, ROUTINE W REFLEX MICROSCOPIC - Abnormal; Notable for the following components:   Color, Urine YELLOW (*)    APPearance CLEAR (*)    All other components within normal limits  CBC  LACTIC ACID, PLASMA  LACTIC ACID, PLASMA     EKG    RADIOLOGY CT per my interpretation shows large ventral hernia but no obvious stranding or signs of obstruction or strangulation   PROCEDURES:  Critical Care performed: No  Procedures   MEDICATIONS ORDERED IN ED: Medications  lactated ringers bolus 1,000 mL (0 mLs Intravenous Stopped 04/30/23 1400)  ondansetron (ZOFRAN) injection 4 mg (4 mg Intravenous Given 04/30/23 1107)  morphine (PF) 4 MG/ML injection 4 mg (4 mg Intravenous Given 04/30/23 1106)  iohexol (OMNIPAQUE) 300 MG/ML solution 100 mL (100 mLs Intravenous Contrast Given 04/30/23 1244)  morphine  (PF) 4 MG/ML injection 4 mg (4 mg Intravenous Given 04/30/23 1413)     IMPRESSION / MDM / ASSESSMENT AND PLAN / ED COURSE  I reviewed the triage vital signs and the nursing notes.                              Differential diagnosis includes, but is not limited to, SBO, hernia incarceration versus strangulation, diverticulitis, colitis, enteritis  Patient's presentation is most consistent with acute complicated illness / injury requiring diagnostic workup.  Patient is a 63 year old female presenting today for abdominal pain around her incisional hernia and left lower quadrant pain with history of diverticulitis.  Vital signs are stable.  Exam shows what appears to be easily reducible incisional hernia with no obvious skin discoloration.  Lower concern for acute incarceration or strangulation.  Will get CT to evaluate hernia as well as rule out signs of diverticulitis.  Will give 1 L fluids, morphine, and Zofran for symptomatic management.  CT pending at time of signout.  Otherwise, laboratory workup largely reassuring.  Lipase improved from recent baseline.  CBC with no leukocytosis.  CMP with mild AST and ALT elevation.  Lactic acid normal.  UA negative.  Patient signed out to oncoming provider pending results of CT.  Suspect if there is no change in her ventral hernia or other acute infections, can likely be discharged home.  The patient is on the cardiac monitor to evaluate for evidence of arrhythmia and/or significant heart rate changes. Clinical Course as of 04/30/23 1509  Wed Apr 30, 2023  1202 Lipase(!): 70 Decreased from prior level [DW]    Clinical Course User Index [DW] Kandee Orion, MD     FINAL CLINICAL IMPRESSION(S) / ED DIAGNOSES   Final diagnoses:  Ventral hernia without obstruction or gangrene  Generalized abdominal pain  Nausea and vomiting, unspecified vomiting type     Rx / DC Orders   ED Discharge Orders     None        Note:  This document was  prepared using Dragon voice recognition software and may include unintentional dictation errors.   Kandee Orion, MD 04/30/23 [phone]

## 2023-04-30 NOTE — Discharge Instructions (Signed)
 Please use lidocaine patches at your site of pain.  Apply 1 patch at a time, leave on for 12 hours, then remove for 12 hours.  12 hours on, 12 hours off.  Do not apply more than 1 patch at a time.  Valtrex antiviral for shingles twice daily for 1 week  Gabapentin as needed up to 3 times per day  Follow-up with Dr. Ofilia Benton  Return to the ED with any worsening symptoms

## 2023-04-30 NOTE — ED Triage Notes (Signed)
 Pt states ABD pain for 2 days, pt states HX of hernia, pt states hernia feels different now. Pt endorses. N/V and burning in her stomach. NAD noted.

## 2023-05-01 ENCOUNTER — Other Ambulatory Visit: Payer: Self-pay

## 2023-05-07 NOTE — H&P (View-Only) (Signed)
 Patient ID: Stacy Gilmore, female   DOB: 1960/09/09, 63 y.o.   MRN: [id number]  Chief Complaint: Incisional hernia  History of Present Illness Stacy Gilmore is a 63 y.o. female with follow-up for known incisional hernia found on CT scan, now here for follow-up CT scan after significant enlargement.  She was told she had bilateral inguinal hernias which are not clinically symptomatic at this time.  She was instructed to progress toward complete tobacco cessation, and she has now demonstrated cessation of smoking for 3 weeks.  She reports she has even lost some weight as well.  Seems her recent/prior weight gain was all abdominal.  She presents today wearing her abdominal binder.  She has a rather prominent bulge that is from the upper aspect of her incision seemingly cephalad from her umbilicus.  This has been giving her pain.  No change in bowel habits, no associated nausea or vomiting, no fevers or chills.  Past Medical History Past Medical History:  Diagnosis Date   Acute diverticulitis 06/19/2022   Allergy    Anemia    Anxiety    Arthritis    Asthma    Cellulitis of face 11/12/2022   COPD (chronic obstructive pulmonary disease) (HCC)    Depression    Diverticulitis large intestine 09/28/2022   Diverticulitis of colon 07/25/2022   Diverticulitis of intestine with abscess, recurrent 06/19/2022   GERD (gastroesophageal reflux disease)    History of diverticulitis    Osteoporosis       Past Surgical History:  Procedure Laterality Date   APPENDECTOMY N/A 10/04/2022   Procedure: APPENDECTOMY;  Surgeon: [name], MD;  Location: [facility];  Service: General;  Laterality: N/A;   CERVICAL SPINE SURGERY     CHOLECYSTECTOMY     COLON RESECTION SIGMOID N/A 10/04/2022   Procedure: COLON RESECTION SIGMOID;  Surgeon: [name], MD;  Location: [facility];  Service: General;  Laterality: N/A;  Splenic Flexure Takedown   LAPAROTOMY N/A 10/04/2022   Procedure: EXPLORATION  LAPAROTOMY;  Surgeon: [name], MD;  Location: [facility];  Service: General;  Laterality: N/A;   TUBAL LIGATION      Allergies  Allergen Reactions   Bee Venom Swelling   Penicillins Hives and Swelling    Tolerated ceftriaxone    Armoracia Rusticana Ext (Horseradish) Rash    Blisters the mouth   Other Rash    Ragu spaghetti sauce    Current Outpatient Medications  Medication Sig Dispense Refill   albuterol  (VENTOLIN  HFA) 108 (90 Base) MCG/ACT inhaler Inhale 2 puffs into the lungs every 4 (four) hours as needed for wheezing or shortness of breath. 18 g 5   atorvastatin  (LIPITOR) 10 MG tablet Take 1 tablet (10 mg total) by mouth daily. 90 tablet 3   budesonide -formoterol  (SYMBICORT ) 160-4.5 MCG/ACT inhaler Inhale 2 puffs into the lungs 2 (two) times daily. 10.2 g 3   Fezolinetant  (VEOZAH ) 45 MG TABS Take 1 tablet (45 mg total) by mouth daily. 30 tablet 2   nicotine  (NICODERM CQ  - DOSED IN MG/24 HOURS) 21 mg/24hr patch Place 1 patch (21 mg total) onto the skin daily. 28 patch 0   omeprazole  (PRILOSEC) 20 MG capsule Take 1 capsule (20 mg total) by mouth daily. 30 capsule 3   ondansetron  (ZOFRAN ) 4 MG tablet Take 1 tablet (4 mg total) by mouth every 8 (eight) hours as needed for nausea or vomiting. 20 tablet 0   senna-docusate (SENOKOT-S) 8.6-50 MG tablet Take 1 tablet by mouth at bedtime as  needed for mild constipation. 30 tablet 1   No current facility-administered medications for this visit.    Family History Family History  Problem Relation Age of Onset   Anxiety disorder Mother    Lung cancer Mother    Thyroid disease Mother    Epilepsy Mother    Other Father        unknown medical history   Breast cancer Sister        unknown age   Cervical cancer Sister        unknown age   Epilepsy Brother    Cancer Paternal Uncle        unknown type   Other Maternal Grandmother        unknown medical history   Other Maternal Grandfather        unknown medical history    Diabetes Paternal Grandmother    Other Paternal Grandfather        unknown medical history      Social History Social History   Tobacco Use   Smoking status: Former    Current packs/day: 1.00    Average packs/day: 1 pack/day for 40.0 years (40.0 ttl pk-yrs)    Types: Cigarettes    Passive exposure: Past   Smokeless tobacco: Never   Tobacco comments:    [place] Quit info given, patient down to 2 cigarettes per day  Vaping Use   Vaping status: Never Used  Substance Use Topics   Alcohol use: Yes    Alcohol/week: 6.0 standard drinks of alcohol    Types: 6 Cans of beer per week    Comment: 06/19/22 drank a 40oz beer a few days ago, 1/2 of a 15 pack of beer weekly, last use ~ 02/04/22   Drug use: Not Currently    Types: "Crack" cocaine, Cocaine, Methamphetamines, Marijuana    Comment: smokes Marijuana once per week-last use 01/2022, last use crack cocaine 12/08/21        Review of Systems  All other systems reviewed and are negative.    Physical Exam Blood pressure (!) 142/80, pulse 82, height 5' 6.5" (1.689 m), weight 164 lb (74.4 kg), SpO2 97%. Last Weight  Most recent update: 05/08/2023  8:43 AM    Weight  74.4 kg (164 lb)              CONSTITUTIONAL: Well developed, and nourished, appropriately responsive and aware without distress.   EYES: Sclera non-icteric.   EARS, NOSE, MOUTH AND THROAT:  The oropharynx is clear. Oral mucosa is pink and moist.    Hearing is intact to voice.  NECK: Trachea is midline, and there is no jugular venous distension.  LYMPH NODES:  Lymph nodes in the neck are not appreciated. RESPIRATORY:  Lungs are clear, and breath sounds are equal bilaterally.  Normal respiratory effort without pathologic use of accessory muscles. CARDIOVASCULAR: Heart is regular in rate and rhythm.   Well perfused.  GI: The abdomen is notable for a prominent bulge in the supraumbilical area, wid[place] and readily reducible.  Estimated defect diameter 10 cm.  Otherwise  soft, nontender, and nondistended. There were no palpable masses.  I did not appreciate ascites, though her abdomen is more protuberant as though she had some degree of it.  GU: On prior exam, not repeated today: Both groins were tender with Valsalva, but without prominent bulge. MUSCULOSKELETAL:  Symmetrical muscle tone appreciated in all four extremities.    SKIN: Skin turgor is normal. No pathologic skin lesions appreciated.  NEUROLOGIC:  Motor and sensation appear grossly normal.  Cranial nerves are grossly without defect. PSYCH:  Alert and oriented to person, place and time. Affect is appropriate for situation.  Data Reviewed I have personally reviewed what is currently available of the patient's imaging, recent labs and medical records.   Labs:     Latest Ref Rng & Units 04/30/2023   10:34 AM 01/22/2023    4:24 AM 11/13/2022    5:01 AM  CBC  WBC 4.0 - 10.5 K/uL 7.0  7.2  5.2   Hemoglobin 12.0 - 15.0 g/dL 86.5  78.4  69.6   Hematocrit 36.0 - 46.0 % 44.5  45.5  35.3   Platelets 150 - 400 K/uL 266  305  220       Latest Ref Rng & Units 04/30/2023   10:34 AM 01/22/2023    4:24 AM 11/14/2022    9:38 AM  CMP  Glucose 70 - 99 mg/dL 96  91    BUN 8 - 23 mg/dL 9  11    Creatinine 2.95 - 1.00 mg/dL 2.84  1.32    Sodium 440 - 145 mmol/L 136  141    Potassium 3.5 - 5.1 mmol/L 3.6  4.1    Chloride 98 - 111 mmol/L 105  106    CO2 22 - 32 mmol/L 22  24    Calcium  8.9 - 10.3 mg/dL 8.9  8.8    Total Protein 6.5 - 8.1 g/dL 7.6  7.5  6.4   Total Bilirubin 0.0 - 1.2 mg/dL 0.8  0.5  0.3   Alkaline Phos 38 - 126 U/L 70  66  68   AST 15 - 41 U/L 67  39  94   ALT 0 - 44 U/L 86  42  139    Imaging: Radiological images reviewed:  CLINICAL DATA:  Abdominal pain. Bowel obstruction suspected, with pain and swelling onset 3 days ago. Appendectomy performed last fall. Sigmoid colectomy with primary anastomosis. Both done 10/04/2022.   EXAM: CT ABDOMEN AND PELVIS WITH CONTRAST    TECHNIQUE: Multidetector CT imaging of the abdomen and pelvis was performed using the standard protocol following bolus administration of intravenous contrast.   RADIATION DOSE REDUCTION: This exam was performed according to the departmental dose-optimization program which includes automated exposure control, adjustment of the mA and/or kV according to patient size and/or use of iterative reconstruction technique.   CONTRAST:  OMNIPAQUE  IOHEXOL  300 MG/ML  SOLN   COMPARISON:  Preoperative CT with IV contrast 10/02/2022, 09/28/2022   FINDINGS: Lower chest: No acute abnormality.   Hepatobiliary: No focal liver abnormality is seen. Status post cholecystectomy. No biliary dilatation.   Pancreas: No abnormality.   Spleen: No abnormality.  No splenomegaly.   Adrenals/Urinary Tract: Adrenal glands are unremarkable. Kidneys are normal, without renal calculi, focal lesion, or hydronephrosis. Bladder is unremarkable.   Stomach/Bowel: Contracted stomach with chronic thickened folds. Normal caliber small bowel with scattered segments showing mucosal enhancement without thickening, mild fluid filling.   Findings consistent with nonspecific enteritis without inflammatory changes.   Status post appendectomy. Status post sigmoid colectomy and reanastomosis.   Few scattered colonic diverticula noted without acute inflammatory change. No overt wall thickening.   Vascular/Lymphatic: There is aortic atherosclerosis without AAA or dissection. There are no enlarged lymph nodes.   Reproductive: Uterus and bilateral adnexa are unremarkable.   Other: Small left paraumbilical and bilateral inguinal fat hernias. No incarcerated hernia. No free fluid, free air, free hemorrhage focal inflammatory process.  Musculoskeletal: Osteopenia and mild degenerative change of the spine. No acute or other significant osseous findings.   IMPRESSION: 1. Imaging findings of nonspecific enteritis  without inflammatory changes. 2. Contracted stomach with chronic thickened folds. 3. Status post sigmoid colectomy and reanastomosis, appendectomy. 4. Aortic atherosclerosis. 5. Small left paraumbilical and bilateral inguinal fat hernias. 6. Osteopenia and degenerative change.   Aortic Atherosclerosis (ICD10-I70.0).     Electronically Signed   By: Denman Fischer M.D.   On: 01/22/2023 07:05    CLINICAL DATA:  No enlarged incisional hernia with worsening abdominal pain.   EXAM: CT ABDOMEN AND PELVIS WITH CONTRAST   TECHNIQUE: Multidetector CT imaging of the abdomen and pelvis was performed using the standard protocol following bolus administration of intravenous contrast.   RADIATION DOSE REDUCTION: This exam was performed according to the departmental dose-optimization program which includes automated exposure control, adjustment of the mA and/or kV according to patient size and/or use of iterative reconstruction technique.   CONTRAST:  OMNIPAQUE  IOHEXOL  300 MG/ML  SOLN   COMPARISON:  CT abdomen/pelvis dated 04/09/2023.   FINDINGS: Lower chest: No acute abnormality.   Hepatobiliary: No focal liver abnormality is seen. Status post cholecystectomy. No biliary dilatation.   Pancreas: Unremarkable. No pancreatic ductal dilatation or surrounding inflammatory changes.   Spleen: Normal in size without focal abnormality.   Adrenals/Urinary Tract: Adrenal glands are unremarkable. Kidneys are normal, without renal calculi, focal lesion, or hydronephrosis. Bladder is distended with slight outpouching of the anterior inferior bladder into a similar region of midline lower ventral abdominal wall laxity. No significant associated inflammatory changes.   Stomach/Bowel: A large anterior abdominal wall supraumbilical hernia containing several loops of small bowel, transverse colon and fat is again noted without evidence of obstruction or incarceration. The neck of the  hernia defect measures approximately 8.6 x 7.1 cm.   Stomach is within normal limits. Status post appendectomy. No focal inflammatory changes. Sigmoid anastomosis is again noted.   Vascular/Lymphatic: Aortic atherosclerosis. No enlarged abdominal or pelvic lymph nodes.   Reproductive: Uterus and bilateral adnexa are unremarkable.   Other: No abdominopelvic ascites. No intraperitoneal free air. Postoperative scarring along the ventral abdominal wall.   Musculoskeletal: No acute osseous abnormality. No suspicious osseous lesion. Healed right-sided rib fractures.   IMPRESSION: 1. Large anterior abdominal wall supraumbilical hernia containing loops of small bowel, transverse colon, and fat without evidence of obstruction or incarceration. 2.  Aortic Atherosclerosis (ICD10-I70.0).     Electronically Signed   By: Mannie Seek M.D.   On: 04/30/2023 17:26  Within last 24 hrs: No results found.  Assessment    Incisional hernia, symptomatic with significant increase in size over the last 3 months. Bilateral fat filled inguinal defects.  Patient Active Problem List   Diagnosis Date Noted   Vasomotor symptoms due to menopause 03/17/2023   History of abnormal cervical Pap smear 03/17/2023   Acute non-recurrent frontal sinusitis 03/04/2023   Nausea 03/04/2023   Impacted cerumen of right ear 03/04/2023   Moderate recurrent major depression (HCC) 02/11/2023   Encounter for smoking cessation counseling 02/11/2023   Hearing loss of both ears due to cerumen impaction 02/11/2023   Chronic pain of multiple joints 02/11/2023   Incisional hernia, without obstruction or gangrene 01/28/2023   Non-recurrent bilateral inguinal hernia without obstruction or gangrene 01/28/2023   Status post partial colectomy 11/21/2022   Elevated LFTs 11/12/2022   Electrolyte abnormality 11/12/2022   Hypokalemia 10/11/2022   Gastroesophageal reflux disease without esophagitis 06/27/2022  Essential  hypertension 06/27/2022   Protein-calorie malnutrition, moderate (HCC) 06/27/2022   Malnutrition of moderate degree 06/24/2022   Dysuria 06/19/2022   Coronary atherosclerosis 02/07/2022   Aortic atherosclerosis (HCC) 02/07/2022   Diarrhea 02/06/2022   Cocaine use 02/06/2022   Generalized joint pain 01/17/2022   Health care maintenance 01/17/2022   Encounter to establish care 12/11/2021   History of asthma 12/11/2021   Nicotine  dependence, cigarettes, in remission 12/11/2021   Chronic obstructive pulmonary disease (HCC) 12/11/2021    Plan    Robotic assisted laparoscopic repair of anterior abdominal wall hernia, possible fascial defect size greater than 10 cm;  Non-recurrent.  Again, we reviewed the possibility of incarceration, strangulation, enlargement in size over time, and the need for emergency surgery in the face of these.  Also reviewed the techniques of reduction should incarceration occur, and when unsuccessful to present to the ED.  Also discussed that surgery risks include recurrence which can be up to 30% in the case of complex hernias, use of prosthetic materials (mesh) and the increased risk of infection and the possible need for re-operation and removal of mesh, possibility of post-op SBO or ileus, and the risks of general anesthetic including heart attack, stroke, sudden death or some reaction to anesthetic medications. The patient, and those present, appear to understand the risks, any and all questions were answered to the patient's satisfaction.  No guarantees were ever expressed or implied.    Face-to-face time spent with the patient and accompanying care providers(if present) was 30 minutes, spent counseling, educating, and coordinating care of the patient.    These notes generated with voice recognition software. I apologize for typographical errors.  [name] M.D., FACS 05/08/2023, 9:13 AM

## 2023-05-07 NOTE — Progress Notes (Unsigned)
 Patient ID: Stacy Gilmore, female   DOB: 03/21/1960, 63 y.o.   MRN: [id number]  Chief Complaint: Incisional hernia  History of Present Illness Stacy Gilmore is a 63 y.o. female with follow-up for known incisional hernia found on CT scan, now here for follow-up CT scan after significant enlargement.  She was told she had bilateral inguinal hernias which are not clinically symptomatic at this time.  She was instructed to progress toward complete tobacco cessation, and she has now demonstrated cessation of smoking for 3 weeks.  She reports she has even lost some weight as well.  Seems her recent/prior weight gain was all abdominal.  She presents today wearing her abdominal binder.  She has a rather prominent bulge that is from the upper aspect of her incision seemingly cephalad from her umbilicus.  This has been giving her pain.  No change in bowel habits, no associated nausea or vomiting, no fevers or chills.  Past Medical History Past Medical History:  Diagnosis Date   Acute diverticulitis 06/19/2022   Allergy    Anemia    Anxiety    Arthritis    Asthma    Cellulitis of face 11/12/2022   COPD (chronic obstructive pulmonary disease) (HCC)    Depression    Diverticulitis large intestine 09/28/2022   Diverticulitis of colon 07/25/2022   Diverticulitis of intestine with abscess, recurrent 06/19/2022   GERD (gastroesophageal reflux disease)    History of diverticulitis    Osteoporosis       Past Surgical History:  Procedure Laterality Date   APPENDECTOMY N/A 10/04/2022   Procedure: APPENDECTOMY;  Surgeon: [name], MD;  Location: [facility];  Service: General;  Laterality: N/A;   CERVICAL SPINE SURGERY     CHOLECYSTECTOMY     COLON RESECTION SIGMOID N/A 10/04/2022   Procedure: COLON RESECTION SIGMOID;  Surgeon: [name], MD;  Location: [facility];  Service: General;  Laterality: N/A;  Splenic Flexure Takedown   LAPAROTOMY N/A 10/04/2022   Procedure: EXPLORATION  LAPAROTOMY;  Surgeon: [name], MD;  Location: [facility];  Service: General;  Laterality: N/A;   TUBAL LIGATION      Allergies  Allergen Reactions   Bee Venom Swelling   Penicillins Hives and Swelling    Tolerated ceftriaxone    Armoracia Rusticana Ext (Horseradish) Rash    Blisters the mouth   Other Rash    Ragu spaghetti sauce    Current Outpatient Medications  Medication Sig Dispense Refill   albuterol  (VENTOLIN  HFA) 108 (90 Base) MCG/ACT inhaler Inhale 2 puffs into the lungs every 4 (four) hours as needed for wheezing or shortness of breath. 18 g 5   atorvastatin  (LIPITOR) 10 MG tablet Take 1 tablet (10 mg total) by mouth daily. 90 tablet 3   budesonide -formoterol  (SYMBICORT ) 160-4.5 MCG/ACT inhaler Inhale 2 puffs into the lungs 2 (two) times daily. 10.2 g 3   Fezolinetant  (VEOZAH ) 45 MG TABS Take 1 tablet (45 mg total) by mouth daily. 30 tablet 2   nicotine  (NICODERM CQ  - DOSED IN MG/24 HOURS) 21 mg/24hr patch Place 1 patch (21 mg total) onto the skin daily. 28 patch 0   omeprazole  (PRILOSEC) 20 MG capsule Take 1 capsule (20 mg total) by mouth daily. 30 capsule 3   ondansetron  (ZOFRAN ) 4 MG tablet Take 1 tablet (4 mg total) by mouth every 8 (eight) hours as needed for nausea or vomiting. 20 tablet 0   senna-docusate (SENOKOT-S) 8.6-50 MG tablet Take 1 tablet by mouth at bedtime as  needed for mild constipation. 30 tablet 1   No current facility-administered medications for this visit.    Family History Family History  Problem Relation Age of Onset   Anxiety disorder Mother    Lung cancer Mother    Thyroid disease Mother    Epilepsy Mother    Other Father        unknown medical history   Breast cancer Sister        unknown age   Cervical cancer Sister        unknown age   Epilepsy Brother    Cancer Paternal Uncle        unknown type   Other Maternal Grandmother        unknown medical history   Other Maternal Grandfather        unknown medical history    Diabetes Paternal Grandmother    Other Paternal Grandfather        unknown medical history      Social History Social History   Tobacco Use   Smoking status: Former    Current packs/day: 1.00    Average packs/day: 1 pack/day for 40.0 years (40.0 ttl pk-yrs)    Types: Cigarettes    Passive exposure: Past   Smokeless tobacco: Never   Tobacco comments:     Quit info given, patient down to 2 cigarettes per day  Vaping Use   Vaping status: Never Used  Substance Use Topics   Alcohol use: Yes    Alcohol/week: 6.0 standard drinks of alcohol    Types: 6 Cans of beer per week    Comment: 06/19/22 drank a 40oz beer a few days ago, 1/2 of a 15 pack of beer weekly, last use ~ 02/04/22   Drug use: Not Currently    Types: "Crack" cocaine, Cocaine, Methamphetamines, Marijuana    Comment: smokes Marijuana once per week-last use 01/2022, last use crack cocaine 12/08/21        Review of Systems  All other systems reviewed and are negative.    Physical Exam Blood pressure (!) 142/80, pulse 82, height 5' 6.5" (1.689 m), weight 164 lb (74.4 kg), SpO2 97%. Last Weight  Most recent update: 05/08/2023  8:43 AM    Weight  74.4 kg (164 lb)              CONSTITUTIONAL: Well developed, and nourished, appropriately responsive and aware without distress.   EYES: Sclera non-icteric.   EARS, NOSE, MOUTH AND THROAT:  The oropharynx is clear. Oral mucosa is pink and moist.    Hearing is intact to voice.  NECK: Trachea is midline, and there is no jugular venous distension.  LYMPH NODES:  Lymph nodes in the neck are not appreciated. RESPIRATORY:  Lungs are clear, and breath sounds are equal bilaterally.  Normal respiratory effort without pathologic use of accessory muscles. CARDIOVASCULAR: Heart is regular in rate and rhythm.   Well perfused.  GI: The abdomen is notable for a prominent bulge in the supraumbilical area, widemouth and readily reducible.  Estimated defect diameter 10 cm.  Otherwise  soft, nontender, and nondistended. There were no palpable masses.  I did not appreciate ascites, though her abdomen is more protuberant as though she had some degree of it.  GU: On prior exam, not repeated today: Both groins were tender with Valsalva, but without prominent bulge. MUSCULOSKELETAL:  Symmetrical muscle tone appreciated in all four extremities.    SKIN: Skin turgor is normal. No pathologic skin lesions appreciated.  NEUROLOGIC:  Motor and sensation appear grossly normal.  Cranial nerves are grossly without defect. PSYCH:  Alert and oriented to person, place and time. Affect is appropriate for situation.  Data Reviewed I have personally reviewed what is currently available of the patient's imaging, recent labs and medical records.   Labs:     Latest Ref Rng & Units 04/30/2023   10:34 AM 01/22/2023    4:24 AM 11/13/2022    5:01 AM  CBC  WBC 4.0 - 10.5 K/uL 7.0  7.2  5.2   Hemoglobin 12.0 - 15.0 g/dL 16.1  09.6  04.5   Hematocrit 36.0 - 46.0 % 44.5  45.5  35.3   Platelets 150 - 400 K/uL 266  305  220       Latest Ref Rng & Units 04/30/2023   10:34 AM 01/22/2023    4:24 AM 11/14/2022    9:38 AM  CMP  Glucose 70 - 99 mg/dL 96  91    BUN 8 - 23 mg/dL 9  11    Creatinine 4.09 - 1.00 mg/dL 8.11  9.14    Sodium 782 - 145 mmol/L 136  141    Potassium 3.5 - 5.1 mmol/L 3.6  4.1    Chloride 98 - 111 mmol/L 105  106    CO2 22 - 32 mmol/L 22  24    Calcium  8.9 - 10.3 mg/dL 8.9  8.8    Total Protein 6.5 - 8.1 g/dL 7.6  7.5  6.4   Total Bilirubin 0.0 - 1.2 mg/dL 0.8  0.5  0.3   Alkaline Phos 38 - 126 U/L 70  66  68   AST 15 - 41 U/L 67  39  94   ALT 0 - 44 U/L 86  42  139    Imaging: Radiological images reviewed:  CLINICAL DATA:  Abdominal pain. Bowel obstruction suspected, with pain and swelling onset 3 days ago. Appendectomy performed last fall. Sigmoid colectomy with primary anastomosis. Both done 10/04/2022.   EXAM: CT ABDOMEN AND PELVIS WITH CONTRAST    TECHNIQUE: Multidetector CT imaging of the abdomen and pelvis was performed using the standard protocol following bolus administration of intravenous contrast.   RADIATION DOSE REDUCTION: This exam was performed according to the departmental dose-optimization program which includes automated exposure control, adjustment of the mA and/or kV according to patient size and/or use of iterative reconstruction technique.   CONTRAST:  OMNIPAQUE  IOHEXOL  300 MG/ML  SOLN   COMPARISON:  Preoperative CT with IV contrast 10/02/2022, 09/28/2022   FINDINGS: Lower chest: No acute abnormality.   Hepatobiliary: No focal liver abnormality is seen. Status post cholecystectomy. No biliary dilatation.   Pancreas: No abnormality.   Spleen: No abnormality.  No splenomegaly.   Adrenals/Urinary Tract: Adrenal glands are unremarkable. Kidneys are normal, without renal calculi, focal lesion, or hydronephrosis. Bladder is unremarkable.   Stomach/Bowel: Contracted stomach with chronic thickened folds. Normal caliber small bowel with scattered segments showing mucosal enhancement without thickening, mild fluid filling.   Findings consistent with nonspecific enteritis without inflammatory changes.   Status post appendectomy. Status post sigmoid colectomy and reanastomosis.   Few scattered colonic diverticula noted without acute inflammatory change. No overt wall thickening.   Vascular/Lymphatic: There is aortic atherosclerosis without AAA or dissection. There are no enlarged lymph nodes.   Reproductive: Uterus and bilateral adnexa are unremarkable.   Other: Small left paraumbilical and bilateral inguinal fat hernias. No incarcerated hernia. No free fluid, free air, free hemorrhage focal inflammatory process.  Musculoskeletal: Osteopenia and mild degenerative change of the spine. No acute or other significant osseous findings.   IMPRESSION: 1. Imaging findings of nonspecific enteritis  without inflammatory changes. 2. Contracted stomach with chronic thickened folds. 3. Status post sigmoid colectomy and reanastomosis, appendectomy. 4. Aortic atherosclerosis. 5. Small left paraumbilical and bilateral inguinal fat hernias. 6. Osteopenia and degenerative change.   Aortic Atherosclerosis (ICD10-I70.0).     Electronically Signed   By: Denman Fischer M.D.   On: 01/22/2023 07:05    CLINICAL DATA:  No enlarged incisional hernia with worsening abdominal pain.   EXAM: CT ABDOMEN AND PELVIS WITH CONTRAST   TECHNIQUE: Multidetector CT imaging of the abdomen and pelvis was performed using the standard protocol following bolus administration of intravenous contrast.   RADIATION DOSE REDUCTION: This exam was performed according to the departmental dose-optimization program which includes automated exposure control, adjustment of the mA and/or kV according to patient size and/or use of iterative reconstruction technique.   CONTRAST:  OMNIPAQUE  IOHEXOL  300 MG/ML  SOLN   COMPARISON:  CT abdomen/pelvis dated 04/09/2023.   FINDINGS: Lower chest: No acute abnormality.   Hepatobiliary: No focal liver abnormality is seen. Status post cholecystectomy. No biliary dilatation.   Pancreas: Unremarkable. No pancreatic ductal dilatation or surrounding inflammatory changes.   Spleen: Normal in size without focal abnormality.   Adrenals/Urinary Tract: Adrenal glands are unremarkable. Kidneys are normal, without renal calculi, focal lesion, or hydronephrosis. Bladder is distended with slight outpouching of the anterior inferior bladder into a similar region of midline lower ventral abdominal wall laxity. No significant associated inflammatory changes.   Stomach/Bowel: A large anterior abdominal wall supraumbilical hernia containing several loops of small bowel, transverse colon and fat is again noted without evidence of obstruction or incarceration. The neck of the  hernia defect measures approximately 8.6 x 7.1 cm.   Stomach is within normal limits. Status post appendectomy. No focal inflammatory changes. Sigmoid anastomosis is again noted.   Vascular/Lymphatic: Aortic atherosclerosis. No enlarged abdominal or pelvic lymph nodes.   Reproductive: Uterus and bilateral adnexa are unremarkable.   Other: No abdominopelvic ascites. No intraperitoneal free air. Postoperative scarring along the ventral abdominal wall.   Musculoskeletal: No acute osseous abnormality. No suspicious osseous lesion. Healed right-sided rib fractures.   IMPRESSION: 1. Large anterior abdominal wall supraumbilical hernia containing loops of small bowel, transverse colon, and fat without evidence of obstruction or incarceration. 2.  Aortic Atherosclerosis (ICD10-I70.0).     Electronically Signed   By: Mannie Seek M.D.   On: 04/30/2023 17:26  Within last 24 hrs: No results found.  Assessment    Incisional hernia, symptomatic with significant increase in size over the last 3 months. Bilateral fat filled inguinal defects.  Patient Active Problem List   Diagnosis Date Noted   Vasomotor symptoms due to menopause 03/17/2023   History of abnormal cervical Pap smear 03/17/2023   Acute non-recurrent frontal sinusitis 03/04/2023   Nausea 03/04/2023   Impacted cerumen of right ear 03/04/2023   Moderate recurrent major depression (HCC) 02/11/2023   Encounter for smoking cessation counseling 02/11/2023   Hearing loss of both ears due to cerumen impaction 02/11/2023   Chronic pain of multiple joints 02/11/2023   Incisional hernia, without obstruction or gangrene 01/28/2023   Non-recurrent bilateral inguinal hernia without obstruction or gangrene 01/28/2023   Status post partial colectomy 11/21/2022   Elevated LFTs 11/12/2022   Electrolyte abnormality 11/12/2022   Hypokalemia 10/11/2022   Gastroesophageal reflux disease without esophagitis 06/27/2022  Essential  hypertension 06/27/2022   Protein-calorie malnutrition, moderate (HCC) 06/27/2022   Malnutrition of moderate degree 06/24/2022   Dysuria 06/19/2022   Coronary atherosclerosis 02/07/2022   Aortic atherosclerosis (HCC) 02/07/2022   Diarrhea 02/06/2022   Cocaine use 02/06/2022   Generalized joint pain 01/17/2022   Health care maintenance 01/17/2022   Encounter to establish care 12/11/2021   History of asthma 12/11/2021   Nicotine  dependence, cigarettes, in remission 12/11/2021   Chronic obstructive pulmonary disease (HCC) 12/11/2021    Plan    Robotic assisted laparoscopic repair of anterior abdominal wall hernia, possible fascial defect size greater than 10 cm;  Non-recurrent.  Again, we reviewed the possibility of incarceration, strangulation, enlargement in size over time, and the need for emergency surgery in the face of these.  Also reviewed the techniques of reduction should incarceration occur, and when unsuccessful to present to the ED.  Also discussed that surgery risks include recurrence which can be up to 30% in the case of complex hernias, use of prosthetic materials (mesh) and the increased risk of infection and the possible need for re-operation and removal of mesh, possibility of post-op SBO or ileus, and the risks of general anesthetic including heart attack, stroke, sudden death or some reaction to anesthetic medications. The patient, and those present, appear to understand the risks, any and all questions were answered to the patient's satisfaction.  No guarantees were ever expressed or implied.    Face-to-face time spent with the patient and accompanying care providers(if present) was 30 minutes, spent counseling, educating, and coordinating care of the patient.    These notes generated with voice recognition software. I apologize for typographical errors.  [name] M.D., FACS 05/08/2023, 9:13 AM

## 2023-05-08 ENCOUNTER — Telehealth: Payer: Self-pay | Admitting: Surgery

## 2023-05-08 ENCOUNTER — Ambulatory Visit: Payer: Self-pay | Admitting: Surgery

## 2023-05-08 ENCOUNTER — Encounter: Payer: Self-pay | Admitting: Surgery

## 2023-05-08 ENCOUNTER — Ambulatory Visit (INDEPENDENT_AMBULATORY_CARE_PROVIDER_SITE_OTHER): Admitting: Surgery

## 2023-05-08 VITALS — BP 142/80 | HR 82 | Ht 66.5 in | Wt 164.0 lb

## 2023-05-08 DIAGNOSIS — K432 Incisional hernia without obstruction or gangrene: Secondary | ICD-10-CM | POA: Diagnosis not present

## 2023-05-08 NOTE — Patient Instructions (Signed)
 You have requested to have a Ventral Hernia Repair. This will be done by Dr Ofilia Benton at [facility] Please see your (BLUE) Pre-care sheet for more information. Our surgery scheduler will call you to look at surgery dates and to go over surgery information.   If you are on any injectable weight loss medication, you will need to stop taking your GLP-1 injectable (weight loss) medications 8 days before your surgery to avoid any complications with anesthesia.   You will need to arrange to be out of work for approximately 1-2 weeks and then you may return with a lifting restriction for 4 more weeks. If you have FMLA or Disability paperwork that needs to be filled out, please have your company fax your paperwork to [phone] or you may drop this by either office. This paperwork will be filled out within 3 days after your surgery has been completed.     Ventral Hernia A ventral hernia (also called an incisional hernia) is a hernia that occurs at the site of a previous surgical cut (incision) in the abdomen. The abdominal wall spans from your lower chest down to your pelvis. If the abdominal wall is weakened from a surgical incision, a hernia can occur. A hernia is a bulge of bowel or muscle tissue pushing out on the weakened part of the abdominal wall. Ventral hernias can get bigger from straining or lifting. Obese and older people are at higher risk for a ventral hernia. People who develop infections after surgery or require repeat incisions at the same site on the abdomen are also at increased risk. CAUSES  A ventral hernia occurs because of weakness in the abdominal wall at an incision site.  SYMPTOMS  Common symptoms include: A visible bulge or lump on the abdominal wall. Pain or tenderness around the lump. Increased discomfort if you cough or make a sudden movement. If the hernia has blocked part of the intestine, a serious complication can occur (incarcerated or strangulated hernia). This can become  a problem that requires emergency surgery because the blood flow to the blocked intestine may be cut off. Symptoms may include: Feeling sick to your stomach (nauseous). Throwing up (vomiting). Stomach swelling (distention) or bloating. Fever. Rapid heartbeat. DIAGNOSIS  Your health care provider will take a medical history and perform a physical exam. Various tests may be ordered, such as: Blood tests. Urine tests. Ultrasonography. X-rays. Computed tomography (CT). TREATMENT  Watchful waiting may be all that is needed for a smaller hernia that does not cause symptoms. Your health care provider may recommend the use of a supportive belt (truss) that helps to keep the abdominal wall intact. For larger hernias or those that cause pain, surgery to repair the hernia is usually recommended. If a hernia becomes strangulated, emergency surgery needs to be done right away. HOME CARE INSTRUCTIONS Avoid putting pressure or strain on the abdominal area. Avoid heavy lifting. Use good body positioning for physical tasks. Ask your health care provider about proper body positioning. Use a supportive belt as directed by your health care provider. Maintain a healthy weight. Eat foods that are high in fiber, such as whole grains, fruits, and vegetables. Fiber helps prevent difficult bowel movements (constipation). Drink enough fluids to keep your urine clear or pale yellow. Follow up with your health care provider as directed. SEEK MEDICAL CARE IF:  Your hernia seems to be getting larger or more painful. SEEK IMMEDIATE MEDICAL CARE IF:  You have abdominal pain that is sudden and sharp. Your pain  becomes severe. You have repeated vomiting. You are sweating a lot. You notice a rapid heartbeat. You develop a fever. MAKE SURE YOU:  Understand these instructions. Will watch your condition. Will get help right away if you are not doing well or get worse.     Open Ventral Hernia Repair Open ventral  hernia repair is a surgery to fix a ventral hernia. A ventral hernia,  is a bulge of body tissue or intestines that pushes through the front part of the abdomen. This can happen if the connective tissue covering the muscles over the abdomen has a weak spot or is torn because of a surgical cut (incision) from a previous surgery. A ventral hernia repair is often done soon after diagnosis to stop the hernia from getting bigger, becoming uncomfortable, or becoming an emergency. This surgery usually takes about 2 hours, but the time can vary greatly.  LET [facility] CARE PROVIDER KNOW ABOUT: Any allergies you have. All medicines you are taking, including steroids, vitamins, herbs, eye drops, creams, and over-the-counter medicines. Previous problems you or members of your family have had with the use of anesthetics. Any blood disorders you have. Previous surgeries you have had. Medical conditions you have.  RISKS AND COMPLICATIONS  Generally, Open ventral hernia repair is a safe procedure. However, as with any surgical procedure, problems can occur. Possible problems include: Bleeding. Trouble passing urine or having a bowel movement after the surgery. Infection. Pneumonia. Blood clots. Pain in the area of the hernia. A bulge in the area of the hernia that may be caused by a collection of fluid. Injury to intestines or other structures in the abdomen. Return of the hernia after surgery.  BEFORE THE PROCEDURE  You may need to have blood tests, urine tests, a chest X-ray, or an electrocardiogram done before the day of the surgery. Ask your health care provider about changing or stopping your regular medicines. This is especially important if you are taking diabetes medicines or blood thinners. You may need to wash with a special type of germ-killing soap. Do not eat or drink anything after midnight the night before the procedure or as directed by your health care provider. Make plans to have  someone drive you home after the procedure.  PROCEDURE  Small monitors will be put on your body. They are used to check your heart, blood pressure, and oxygen level. An IV access tube will be put into a vein in your hand or arm. Fluids and medicine will flow directly into your body through the IV tube. You will be given medicine that makes you go to sleep (general anesthetic). Your abdomen will be cleaned with a special soap to kill any germs on your skin. Once you are asleep, a moderate - large size incision will be made in your abdomen. The size of incision depends on how large your hernia is. Your surgeon puts the tissue or intestines that formed the hernia back in place. A screen-like patch (mesh) is used to close the hernia. This helps make the area stronger. Stitches, tacks, or staples are used to keep the mesh in place. Medicine and a bandage (dressing) or skin glue will be put over the incision.  AFTER THE PROCEDURE  You will stay in a recovery area until the anesthetic wears off. Your blood pressure and pulse will be checked often. You may be able to go home the same day or may need to stay in the hospital for 1-2 days after surgery. Your  surgeon will decide when you can go home depending upon your recovery. You may feel some pain. You will be given medicine for pain. You will be urged to do breathing exercises that involve taking deep breaths. This helps prevent a lung infection after a surgery. You may have to wear compression stockings while you are in the hospital. These stockings help keep blood clots from forming in your legs.   This information is not intended to replace advice given to you by your health care provider. Make sure you discuss any questions you have with your health care provider.   Document Released: 12/18/2011 Document Revised: 01/05/2013 Document Reviewed: 12/18/2011 Elsevier Interactive Patient Education [employer]

## 2023-05-08 NOTE — Telephone Encounter (Signed)
 Patient has been advised of Pre-Admission date/time, and Surgery date at [facility].  Surgery Date: 05/14/23 Preadmission Testing Date: 05/13/23 (phone 8a-1p)  Patient informed of the scheduling process and surgery information is given at time of office visit.  Patient has been made aware to call [phone], between 1-3:00pm the day before surgery, to find out what time to arrive for surgery.

## 2023-05-13 ENCOUNTER — Encounter
Admission: RE | Admit: 2023-05-13 | Discharge: 2023-05-13 | Disposition: A | Discharge status: Home / Self Care | Source: Ambulatory Visit | Attending: Surgery | Admitting: Surgery

## 2023-05-13 ENCOUNTER — Other Ambulatory Visit: Payer: Self-pay

## 2023-05-13 HISTORY — DX: Cocaine use, unspecified, uncomplicated: F14.90

## 2023-05-13 HISTORY — DX: Atherosclerotic heart disease of native coronary artery without angina pectoris: I25.10

## 2023-05-13 HISTORY — DX: Essential (primary) hypertension: I10

## 2023-05-13 HISTORY — DX: Atherosclerosis of aorta: I70.0

## 2023-05-13 NOTE — Patient Instructions (Addendum)
 Your procedure is scheduled on: Wednesday 05/14/23 To find out your arrival time, please call [phone] between 1PM - 3PM on:   Tuesday 05/13/23 Report to the Registration Desk on the 1st floor of the [facility]. Valet parking is available.  If your arrival time is 6:00 am, do not arrive before that time as the [facility] entrance doors do not open until 6:00 am.  REMEMBER: Instructions that are not followed completely may result in serious medical risk, up to and including death; or upon the discretion of your surgeon and anesthesiologist your surgery may need to be rescheduled.  Do not eat food or drink any liquids after midnight the night before surgery.  No gum chewing or hard candies.  One week prior to surgery: Stop Anti-inflammatories (NSAIDS) such as Advil , Aleve , Ibuprofen , Motrin , Naproxen , Naprosyn  and Aspirin based products such as Excedrin, Goody's Powder, BC Powder. You may however, continue to take Tylenol  if needed for pain up until the day of surgery.  Stop ANY OVER THE COUNTER supplements until after surgery.  Continue taking all prescribed medications.   TAKE ONLY THESE MEDICATIONS THE MORNING OF SURGERY WITH A SIP OF WATER:  atorvastatin  (LIPITOR) 10 MG tablet  omeprazole  (PRILOSEC) 20 MG capsule   Use inhalers on the day of surgery (Symbicort ) and bring to the hospital (Albuterol ).  No Alcohol for 24 hours before or after surgery.  No Smoking including e-cigarettes for 24 hours before surgery.  No chewable tobacco products for at least 6 hours before surgery.  No nicotine  patches on the day of surgery.  Do not use any "recreational" drugs for at least a week (preferably 2 weeks) before your surgery.  Please be advised that the combination of cocaine and anesthesia may have negative outcomes, up to and including death. If you test positive for cocaine, your surgery will be cancelled.  On the morning of surgery brush your teeth with toothpaste and  water, you may rinse your mouth with mouthwash if you wish. Do not swallow any toothpaste or mouthwash.  Use CHG Soap or wipes as directed on instruction sheet. If unable to come to oofice to pick up CHG shower with an antibacterial soap tonight and in the morning  Do not wear lotions, powders, or perfumes.   Do not shave body hair from the neck down 48 hours before surgery.  Wear comfortable clothing (specific to your surgery type) to the hospital.  Do not wear jewelry, make-up, hairpins, clips or nail polish.  For welded (permanent) jewelry: bracelets, anklets, waist bands, etc.  Please have this removed prior to surgery.  If it is not removed, there is a chance that hospital personnel will need to cut it off on the day of surgery. Contact lenses, hearing aids and dentures may not be worn into surgery.  Do not bring valuables to the hospital. [facility] is not responsible for any missing/lost belongings or valuables.   Notify your doctor if there is any change in your medical condition (cold, fever, infection).  If you are being discharged the day of surgery, you will not be allowed to drive home. You will need a responsible individual to drive you home and stay with you for 24 hours after surgery.   If you are taking public transportation, you will need to have a responsible individual with you.  After surgery, you can help prevent lung complications by doing breathing exercises.  Take deep breaths and cough every 1-2 hours. Your doctor may order a  device called an Incentive Spirometer to help you take deep breaths. When coughing or sneezing, hold a pillow firmly against your incision with both hands. This is called "splinting." Doing this helps protect your incision. It also decreases belly discomfort.  Surgery Visitation Policy:  Patients undergoing a surgery or procedure may have two family members or support persons with them as long as the person is not COVID-19 positive or  experiencing its symptoms.   Please call the Pre-admissions Testing Dept. at [phone] if you have any questions about these instructions.

## 2023-05-14 ENCOUNTER — Encounter: Payer: Self-pay | Admitting: Surgery

## 2023-05-14 ENCOUNTER — Other Ambulatory Visit: Payer: Self-pay

## 2023-05-14 ENCOUNTER — Ambulatory Visit: Admitting: Certified Registered"

## 2023-05-14 ENCOUNTER — Encounter
Admission: RE | Disposition: A | Discharge status: Home / Self Care | Payer: Self-pay | Source: Home / Self Care | Attending: Surgery

## 2023-05-14 ENCOUNTER — Ambulatory Visit
Admission: RE | Admit: 2023-05-14 | Discharge: 2023-05-14 | Disposition: A | Discharge status: Home / Self Care | Attending: Surgery | Admitting: Surgery

## 2023-05-14 DIAGNOSIS — K219 Gastro-esophageal reflux disease without esophagitis: Secondary | ICD-10-CM | POA: Diagnosis not present

## 2023-05-14 DIAGNOSIS — I2510 Atherosclerotic heart disease of native coronary artery without angina pectoris: Secondary | ICD-10-CM | POA: Diagnosis not present

## 2023-05-14 DIAGNOSIS — Z87891 Personal history of nicotine dependence: Secondary | ICD-10-CM | POA: Insufficient documentation

## 2023-05-14 DIAGNOSIS — J4489 Other specified chronic obstructive pulmonary disease: Secondary | ICD-10-CM | POA: Insufficient documentation

## 2023-05-14 DIAGNOSIS — Z7951 Long term (current) use of inhaled steroids: Secondary | ICD-10-CM | POA: Insufficient documentation

## 2023-05-14 DIAGNOSIS — I10 Essential (primary) hypertension: Secondary | ICD-10-CM | POA: Insufficient documentation

## 2023-05-14 DIAGNOSIS — K432 Incisional hernia without obstruction or gangrene: Secondary | ICD-10-CM | POA: Diagnosis present

## 2023-05-14 HISTORY — PX: XI ROBOTIC ASSISTED VENTRAL HERNIA: SHX6789

## 2023-05-14 HISTORY — PX: INSERTION OF MESH: SHX5868

## 2023-05-14 SURGERY — REPAIR, HERNIA, VENTRAL, ROBOT-ASSISTED
Anesthesia: General

## 2023-05-14 MED ORDER — PROPOFOL 10 MG/ML IV BOLUS
INTRAVENOUS | Status: AC
  Filled 2023-05-14: qty 20

## 2023-05-14 MED ORDER — BUPIVACAINE LIPOSOME 1.3 % IJ SUSP
INTRAMUSCULAR | Status: DC | PRN
  Administered 2023-05-14: 20 mL

## 2023-05-14 MED ORDER — GABAPENTIN 300 MG PO CAPS
ORAL_CAPSULE | ORAL | Status: AC
  Filled 2023-05-14: qty 1

## 2023-05-14 MED ORDER — ONDANSETRON HCL 4 MG/2ML IJ SOLN
INTRAMUSCULAR | Status: DC | PRN
  Administered 2023-05-14: 4 mg via INTRAVENOUS

## 2023-05-14 MED ORDER — BUPIVACAINE-EPINEPHRINE (PF) 0.25% -1:200000 IJ SOLN
INTRAMUSCULAR | Status: AC
  Filled 2023-05-14: qty 30

## 2023-05-14 MED ORDER — POLYETHYLENE GLYCOL 3350 17 GM/SCOOP PO POWD
17.0000 g | Freq: Every day | ORAL | 0 refills | Status: AC
Start: 2023-05-14 — End: ?
  Filled 2023-05-14: qty 238, 14d supply, fill #0

## 2023-05-14 MED ORDER — FENTANYL CITRATE (PF) 100 MCG/2ML IJ SOLN
INTRAMUSCULAR | Status: AC
Start: 2023-05-14 — End: ?
  Filled 2023-05-14: qty 2

## 2023-05-14 MED ORDER — HYDROMORPHONE HCL 1 MG/ML IJ SOLN
INTRAMUSCULAR | Status: AC
  Filled 2023-05-14: qty 1

## 2023-05-14 MED ORDER — OXYCODONE HCL 5 MG PO TABS
5.0000 mg | ORAL_TABLET | Freq: Once | ORAL | Status: AC | PRN
  Administered 2023-05-14: 5 mg via ORAL

## 2023-05-14 MED ORDER — CHLORHEXIDINE GLUCONATE CLOTH 2 % EX PADS: 6.0000 | MEDICATED_PAD | Freq: Once | CUTANEOUS | Status: DC

## 2023-05-14 MED ORDER — LIDOCAINE HCL (CARDIAC) PF 100 MG/5ML IV SOSY
PREFILLED_SYRINGE | INTRAVENOUS | Status: DC | PRN
  Administered 2023-05-14: 60 mg via INTRAVENOUS

## 2023-05-14 MED ORDER — CELECOXIB 200 MG PO CAPS
200.0000 mg | ORAL_CAPSULE | ORAL | Status: AC
Start: 2023-05-14 — End: 2023-05-14
  Administered 2023-05-14: 200 mg via ORAL

## 2023-05-14 MED ORDER — CEFAZOLIN SODIUM-DEXTROSE 2-4 GM/100ML-% IV SOLN
2.0000 g | INTRAVENOUS | Status: AC
  Administered 2023-05-14: 2 g via INTRAVENOUS

## 2023-05-14 MED ORDER — DEXMEDETOMIDINE HCL IN NACL 80 MCG/20ML IV SOLN
INTRAVENOUS | Status: DC | PRN
  Administered 2023-05-14 (×2): 8 ug via INTRAVENOUS
  Administered 2023-05-14: 12 ug via INTRAVENOUS

## 2023-05-14 MED ORDER — ROCURONIUM BROMIDE 100 MG/10ML IV SOLN
INTRAVENOUS | Status: DC | PRN
  Administered 2023-05-14: 10 mg via INTRAVENOUS
  Administered 2023-05-14: 40 mg via INTRAVENOUS
  Administered 2023-05-14 (×2): 10 mg via INTRAVENOUS
  Administered 2023-05-14: 20 mg via INTRAVENOUS
  Administered 2023-05-14: 10 mg via INTRAVENOUS

## 2023-05-14 MED ORDER — GABAPENTIN 300 MG PO CAPS
300.0000 mg | ORAL_CAPSULE | ORAL | Status: AC
  Administered 2023-05-14: 300 mg via ORAL

## 2023-05-14 MED ORDER — KETAMINE HCL 50 MG/5ML IJ SOSY
PREFILLED_SYRINGE | INTRAMUSCULAR | Status: DC | PRN
  Administered 2023-05-14: 10 mg via INTRAVENOUS
  Administered 2023-05-14: 15 mg via INTRAVENOUS
  Administered 2023-05-14: 25 mg via INTRAVENOUS

## 2023-05-14 MED ORDER — LACTATED RINGERS IV SOLN: INTRAVENOUS | Status: DC

## 2023-05-14 MED ORDER — KETAMINE HCL 50 MG/5ML IJ SOSY
PREFILLED_SYRINGE | INTRAMUSCULAR | Status: AC
  Filled 2023-05-14: qty 5

## 2023-05-14 MED ORDER — CELECOXIB 200 MG PO CAPS
ORAL_CAPSULE | ORAL | Status: AC
  Filled 2023-05-14: qty 1

## 2023-05-14 MED ORDER — FENTANYL CITRATE (PF) 100 MCG/2ML IJ SOLN
25.0000 ug | INTRAMUSCULAR | Status: DC | PRN
  Administered 2023-05-14 (×2): 25 ug via INTRAVENOUS

## 2023-05-14 MED ORDER — BUPIVACAINE LIPOSOME 1.3 % IJ SUSP: 20.0000 mL | Freq: Once | INTRAMUSCULAR | Status: DC

## 2023-05-14 MED ORDER — ORAL CARE MOUTH RINSE: 15.0000 mL | Freq: Once | OROMUCOSAL | Status: AC

## 2023-05-14 MED ORDER — OXYCODONE HCL 5 MG/5ML PO SOLN: 5.0000 mg | Freq: Once | ORAL | Status: AC | PRN

## 2023-05-14 MED ORDER — MIDAZOLAM HCL 2 MG/2ML IJ SOLN
INTRAMUSCULAR | Status: AC
  Filled 2023-05-14: qty 2

## 2023-05-14 MED ORDER — MIDAZOLAM HCL 5 MG/5ML IJ SOLN
INTRAMUSCULAR | Status: DC | PRN
  Administered 2023-05-14: 2 mg via INTRAVENOUS

## 2023-05-14 MED ORDER — EPHEDRINE SULFATE-NACL 50-0.9 MG/10ML-% IV SOSY
PREFILLED_SYRINGE | INTRAVENOUS | Status: DC | PRN
  Administered 2023-05-14: 5 mg via INTRAVENOUS

## 2023-05-14 MED ORDER — CHLORHEXIDINE GLUCONATE 0.12 % MT SOLN
OROMUCOSAL | Status: AC
  Filled 2023-05-14: qty 15

## 2023-05-14 MED ORDER — CEFAZOLIN SODIUM-DEXTROSE 2-4 GM/100ML-% IV SOLN
INTRAVENOUS | Status: AC
  Filled 2023-05-14: qty 100

## 2023-05-14 MED ORDER — IBUPROFEN 800 MG PO TABS
800.0000 mg | ORAL_TABLET | Freq: Three times a day (TID) | ORAL | 0 refills | Status: DC | PRN
  Filled 2023-05-14: qty 30, 10d supply, fill #0

## 2023-05-14 MED ORDER — BUPIVACAINE LIPOSOME 1.3 % IJ SUSP
INTRAMUSCULAR | Status: AC
  Filled 2023-05-14: qty 20

## 2023-05-14 MED ORDER — ACETAMINOPHEN 500 MG PO TABS
ORAL_TABLET | ORAL | Status: AC
  Filled 2023-05-14: qty 2

## 2023-05-14 MED ORDER — DEXAMETHASONE SODIUM PHOSPHATE 10 MG/ML IJ SOLN
INTRAMUSCULAR | Status: DC | PRN
  Administered 2023-05-14: 10 mg via INTRAVENOUS

## 2023-05-14 MED ORDER — BUPIVACAINE-EPINEPHRINE 0.25% -1:200000 IJ SOLN
INTRAMUSCULAR | Status: DC | PRN
  Administered 2023-05-14: 30 mL

## 2023-05-14 MED ORDER — SUCCINYLCHOLINE CHLORIDE 200 MG/10ML IV SOSY
PREFILLED_SYRINGE | INTRAVENOUS | Status: DC | PRN
  Administered 2023-05-14: 100 mg via INTRAVENOUS

## 2023-05-14 MED ORDER — HYDROMORPHONE HCL 1 MG/ML IJ SOLN
0.5000 mg | INTRAMUSCULAR | Status: DC | PRN
  Administered 2023-05-14 (×2): 0.5 mg via INTRAVENOUS

## 2023-05-14 MED ORDER — PROPOFOL 10 MG/ML IV BOLUS
INTRAVENOUS | Status: DC | PRN
  Administered 2023-05-14: 150 mg via INTRAVENOUS

## 2023-05-14 MED ORDER — CHLORHEXIDINE GLUCONATE 0.12 % MT SOLN
15.0000 mL | Freq: Once | OROMUCOSAL | Status: AC
  Administered 2023-05-14: 15 mL via OROMUCOSAL

## 2023-05-14 MED ORDER — OXYCODONE HCL 5 MG PO TABS
5.0000 mg | ORAL_TABLET | Freq: Four times a day (QID) | ORAL | 0 refills | Status: DC | PRN
  Filled 2023-05-14: qty 15, 4d supply, fill #0

## 2023-05-14 MED ORDER — FENTANYL CITRATE (PF) 100 MCG/2ML IJ SOLN
INTRAMUSCULAR | Status: DC | PRN
  Administered 2023-05-14 (×4): 50 ug via INTRAVENOUS

## 2023-05-14 MED ORDER — ACETAMINOPHEN 500 MG PO TABS
1000.0000 mg | ORAL_TABLET | ORAL | Status: AC
  Administered 2023-05-14: 1000 mg via ORAL

## 2023-05-14 MED ORDER — SUGAMMADEX SODIUM 200 MG/2ML IV SOLN
INTRAVENOUS | Status: DC | PRN
  Administered 2023-05-14: 200 mg via INTRAVENOUS

## 2023-05-14 MED ORDER — FENTANYL CITRATE (PF) 100 MCG/2ML IJ SOLN
INTRAMUSCULAR | Status: AC
  Filled 2023-05-14: qty 2

## 2023-05-14 MED ORDER — OXYCODONE HCL 5 MG PO TABS
ORAL_TABLET | ORAL | Status: AC
  Filled 2023-05-14: qty 1

## 2023-05-14 SURGICAL SUPPLY — 44 items
CHLORAPREP W/TINT 26 (MISCELLANEOUS) IMPLANT
COVER TIP SHEARS 8 DVNC (MISCELLANEOUS) ×2 IMPLANT
COVER WAND RF STERILE (DRAPES) ×2 IMPLANT
DERMABOND ADVANCED .7 DNX12 (GAUZE/BANDAGES/DRESSINGS) ×2 IMPLANT
DRAPE ARM DVNC X/XI (DISPOSABLE) ×6 IMPLANT
DRAPE COLUMN DVNC XI (DISPOSABLE) ×2 IMPLANT
DRIVER NDL MEGA 8 DVNC XI (INSTRUMENTS) IMPLANT
DRIVER NDLE MEGA DVNC XI (INSTRUMENTS) ×2 IMPLANT
ELECTRODE REM PT RTRN 9FT ADLT (ELECTROSURGICAL) ×2 IMPLANT
FORCEPS BPLR R/ABLATION 8 DVNC (INSTRUMENTS) ×2 IMPLANT
GLOVE ORTHO TXT STRL SZ7.5 (GLOVE) ×4 IMPLANT
GOWN STRL REUS W/ TWL LRG LVL3 (GOWN DISPOSABLE) ×2 IMPLANT
GOWN STRL REUS W/ TWL XL LVL3 (GOWN DISPOSABLE) ×4 IMPLANT
GRASPER SUT TROCAR 14GX15 (MISCELLANEOUS) IMPLANT
IRRIGATION STRYKERFLOW (MISCELLANEOUS) IMPLANT
KIT PINK PAD W/HEAD ARE REST (MISCELLANEOUS) ×2 IMPLANT
KIT PINK PAD W/HEAD ARM REST (MISCELLANEOUS) ×2 IMPLANT
KIT TURNOVER KIT A (KITS) ×2 IMPLANT
MANIFOLD NEPTUNE II (INSTRUMENTS) ×2 IMPLANT
MESH OVITEX 1S PERM 10X20 6L (Mesh General) IMPLANT
MESH VENTRALIGHT ST 4.5IN (Mesh General) IMPLANT
NDL DRIVE SUT CUT DVNC (INSTRUMENTS) ×2 IMPLANT
NDL HYPO 22X1.5 SAFETY MO (MISCELLANEOUS) ×2 IMPLANT
NDL INSUFFLATION 14GA 120MM (NEEDLE) ×2 IMPLANT
NEEDLE DRIVE SUT CUT DVNC (INSTRUMENTS) ×2 IMPLANT
NEEDLE HYPO 22X1.5 SAFETY MO (MISCELLANEOUS) ×2 IMPLANT
NEEDLE INSUFFLATION 14GA 120MM (NEEDLE) ×2 IMPLANT
NS IRRIG 500ML POUR BTL (IV SOLUTION) ×2 IMPLANT
PACK LAP CHOLECYSTECTOMY (MISCELLANEOUS) ×2 IMPLANT
SCISSORS METZENBAUM CVD 33 (INSTRUMENTS) IMPLANT
SCISSORS MNPLR CVD DVNC XI (INSTRUMENTS) ×2 IMPLANT
SEAL UNIV 5-12 XI (MISCELLANEOUS) ×6 IMPLANT
SET TUBE SMOKE EVAC HIGH FLOW (TUBING) ×2 IMPLANT
SOL .9 NS 3000ML IRR UROMATIC (IV SOLUTION) IMPLANT
SOLUTION ELECTROSURG ANTI STCK (MISCELLANEOUS) ×2 IMPLANT
SPIKE FLUID TRANSFER (MISCELLANEOUS) ×2 IMPLANT
SUT STRATA 2-0 23CM CT-2 (SUTURE) ×4 IMPLANT
SUT STRATAFIX SPIRAL PDS+ 0 30 (SUTURE) ×2 IMPLANT
SUT VICRYL 0 UR6 27IN ABS (SUTURE) ×2 IMPLANT
SUTURE MNCRL 4-0 27XMF (SUTURE) ×2 IMPLANT
SYSTEM BAG RETRIEVAL 10MM (BASKET) IMPLANT
TRAP FLUID SMOKE EVACUATOR (MISCELLANEOUS) ×2 IMPLANT
TROCAR Z-THREAD FIOS 11X100 BL (TROCAR) ×2 IMPLANT
WATER STERILE IRR 500ML POUR (IV SOLUTION) ×2 IMPLANT

## 2023-05-14 NOTE — Progress Notes (Signed)
 Medicaid ride was called to p/u patient.  Pt's significant other has the phone Medicaid ride will communicate with.  S/O called post op to inform medicaid ride would be picking up pt and was out in parking lot.  This RN took pt out to front of [employer] in wheelchair.  The driver was not there.  Parking attendant stated "the driver left."  Called Pt's s/o and he agreed to pick up patient.

## 2023-05-14 NOTE — Anesthesia Preprocedure Evaluation (Signed)
 Anesthesia Evaluation  Patient identified by MRN, date of birth, ID band Patient awake    Reviewed: Allergy & Precautions, NPO status , Patient's Chart, lab work & pertinent test results  History of Anesthesia Complications Negative for: history of anesthetic complications  Airway Mallampati: III  TM Distance: <3 FB Neck ROM: full    Dental  (+) Missing, Poor Dentition, Chipped   Pulmonary asthma , COPD, former smoker   Pulmonary exam normal        Cardiovascular Exercise Tolerance: Good hypertension, (-) angina + CAD  (-) DOE Normal cardiovascular exam     Neuro/Psych negative neurological ROS  negative psych ROS   GI/Hepatic Neg liver ROS,GERD  Controlled,,  Endo/Other  negative endocrine ROS    Renal/GU      Musculoskeletal   Abdominal   Peds  Hematology negative hematology ROS (+)   Anesthesia Other Findings Past Medical History: 06/19/2022: Acute diverticulitis No date: Allergy No date: Anemia No date: Anxiety No date: Aortic atherosclerosis (HCC) No date: Arthritis No date: Asthma 11/12/2022: Cellulitis of face No date: Cocaine use No date: COPD (chronic obstructive pulmonary disease) (HCC) No date: Coronary atherosclerosis No date: Depression 09/28/2022: Diverticulitis large intestine 07/25/2022: Diverticulitis of colon 06/19/2022: Diverticulitis of intestine with abscess, recurrent No date: GERD (gastroesophageal reflux disease) No date: History of diverticulitis No date: Hypertension No date: Osteoporosis  Past Surgical History: 10/04/2022: APPENDECTOMY; N/A     Comment:  Procedure: APPENDECTOMY;  Surgeon: [name], MD;              Location: [facility] ORS;  Service: General;  Laterality: N/A; 2013: CERVICAL SPINE SURGERY 2013: CHOLECYSTECTOMY 10/04/2022: COLON RESECTION SIGMOID; N/A     Comment:  Procedure: COLON RESECTION SIGMOID;  Surgeon: [name], MD;   Location: [facility] ORS;  Service: General;                Laterality: N/A;  Splenic Flexure Takedown 10/04/2022: LAPAROTOMY; N/A     Comment:  Procedure: EXPLORATION LAPAROTOMY;  Surgeon: [name], MD;  Location: [facility] ORS;  Service: General;                Laterality: N/A; No date: TUBAL LIGATION  BMI    Body Mass Index: 26.07 kg/m      Reproductive/Obstetrics negative OB ROS                             Anesthesia Physical Anesthesia Plan  ASA: 3  Anesthesia Plan: General ETT   Post-op Pain Management:    Induction: Intravenous  PONV Risk Score and Plan: Ondansetron , Dexamethasone , Midazolam  and Treatment may vary due to age or medical condition  Airway Management Planned: Oral ETT  Additional Equipment:   Intra-op Plan:   Post-operative Plan: Extubation in OR  Informed Consent: I have reviewed the patients History and Physical, chart, labs and discussed the procedure including the risks, benefits and alternatives for the proposed anesthesia with the patient or authorized representative who has indicated his/her understanding and acceptance.     Dental Advisory Given  Plan Discussed with: Anesthesiologist, CRNA and Surgeon  Anesthesia Plan Comments: (Patient consented for risks of anesthesia including but not limited to:  - adverse reactions to medications - damage to eyes, teeth, lips or other oral mucosa - nerve damage  due to positioning  - sore throat or hoarseness - Damage to heart, brain, nerves, lungs, other parts of body or loss of life  Patient voiced understanding and assent.)       Anesthesia Quick Evaluation

## 2023-05-14 NOTE — Interval H&P Note (Signed)
 History and Physical Interval Note:  05/14/2023 7:29 AM  Stacy Gilmore  has presented today for surgery, with the diagnosis of incisional hernia initial.  The various methods of treatment have been discussed with the patient and family. After consideration of risks, benefits and other options for treatment, the patient has consented to  Procedure(s): REPAIR, HERNIA, VENTRAL, ROBOT-ASSISTED (N/A) as a surgical intervention.  The patient's history has been reviewed, patient examined, no change in status, stable for surgery.  I have reviewed the patient's chart and labs.  Questions were answered to the patient's satisfaction.     [name]

## 2023-05-14 NOTE — Transfer of Care (Signed)
 Immediate Anesthesia Transfer of Care Note  Patient: Stacy Gilmore  Procedure(s) Performed: REPAIR, HERNIA, VENTRAL, ROBOT-ASSISTED INSERTION OF MESH  Patient Location: PACU  Anesthesia Type:General  Level of Consciousness: drowsy and patient cooperative  Airway & Oxygen Therapy: Patient Spontanous Breathing and Patient connected to face mask oxygen  Post-op Assessment: Report given to RN and Post -op Vital signs reviewed and stable  Post vital signs: Reviewed and stable  Last Vitals:  Vitals Value Taken Time  BP 168/99 05/14/23 1123  Temp    Pulse 67 05/14/23 1123  Resp 20 05/14/23 1123  SpO2 100 % 05/14/23 1123  Vitals shown include unfiled device data.  Last Pain:  Vitals:   05/14/23 0620  TempSrc: Tympanic  PainSc: 8          Complications: No notable events documented.

## 2023-05-14 NOTE — Anesthesia Postprocedure Evaluation (Signed)
 Anesthesia Post Note  Patient: Stacy Gilmore  Procedure(s) Performed: REPAIR, HERNIA, VENTRAL, ROBOT-ASSISTED INSERTION OF MESH  Patient location during evaluation: PACU Anesthesia Type: General Level of consciousness: awake and alert Pain management: pain level controlled Vital Signs Assessment: post-procedure vital signs reviewed and stable Respiratory status: spontaneous breathing, nonlabored ventilation, respiratory function stable and patient connected to nasal cannula oxygen Cardiovascular status: blood pressure returned to baseline and stable Postop Assessment: no apparent nausea or vomiting Anesthetic complications: no   No notable events documented.   Last Vitals:  Vitals:   05/14/23 1200 05/14/23 1221  BP: (!) 150/84 (!) 166/88  Pulse: 70 73  Resp: 17 18  Temp:  37.2 C  SpO2: 92% 94%    Last Pain:  Vitals:   05/14/23 1221  TempSrc: Temporal  PainSc: 3                  Portia Brittle Daiquan Resnik

## 2023-05-14 NOTE — Op Note (Signed)
 Robotic assisted laparoscopic anterior abdominal hernia repair, IPOM +, initial incisional, reducible final measured fascial defect 12 cm.    Pre-operative Diagnosis: Anterior abdominal hernia, preop estimated fascial defect size 8 cm plus smaller periumbilical defects.   Post-operative Diagnosis: With the addition of the inferior defects fascial defect total length is 12 cm.   Surgeon:  [name], M.D., FACS   Anesthesia: Gen. with endotracheal tube   Findings:  12 CM fascial defect.  Estimated Blood Loss: 20 mL   Complications: none           Procedure Details  The patient was seen again in the Holding Room. The benefits, complications, treatment options, and expected outcomes were discussed with the patient. The risks of bleeding, infection, recurrence of symptoms, failure to resolve symptoms, bowel injury, mesh placement, mesh infection, any of which could require further surgery were reviewed with the patient. The likelihood of improving the patient's symptoms with return to their baseline status is good.  The patient and/or family concurred with the proposed plan, giving informed consent.  The patient was taken to Operating Room, identified and the procedure verified.  A Time Out was held and the above information confirmed.   Prior to the induction of general anesthesia, antibiotic prophylaxis was administered. VTE prophylaxis was in place. General endotracheal anesthesia was then administered and tolerated well. After the induction, the abdomen was prepped with Chloraprep and draped in the sterile fashion. The patient was positioned in the supine position.  VERESS  After local infiltration of quarter percent Marcaine  with epinephrine , stab incision was made left upper quadrant.  Just below the costal margin approximately midclavicular line the Veress needle is passed with sensation of the layers to penetrate the abdominal wall and into the peritoneum.  Saline drop test is confirmed  peritoneal placement.  Insufflation is initiated with carbon dioxide to pressures of 15 mmHg. Marcaine  quarter percent  was used to inject all the incision sites. We used a left upper quadrant subcostal incision and using an [employer] FIOS 11 mm trocar, it was inserted with direct visualization and pneumoperitoneum maintained.  No hemodynamic compromise, no evidence of intraperitoneal injury.   2 additional 8 mm ports were placed under direct visualization on the left lateral abdomen.  I visualized the hernia and there was omental contents within a epigastric incisional hernia, there was 1 knuckle of small bowel adhesed to the perimeter which was readily visible, and easily dissected away..    The robot was brought to the surgical field and docked in the standard fashion.  We made sure that all instrumentation was kept under direct vision at all times and there was no collision between the arms.  I scrubbed out and went to the console. Falciform and adjacent preperitoneal adipose and umbilical ligaments and hernia sac were taken down with electrocautery and blunt dissection to allow adequate mesh placement to be secured to the fascial tissue.  I placed a large portion of the hernia sac in the left upper quadrant for later extraction. Confirm and measured that the defect was 12 cm.  .  Using a 0 Stratafix sutures we closed the ventral defect primarily.  There was some degree of tension but through gradual tightening and starting from both cephalad and caudad apices approximated together in the middle with complete closure of the fascia.  At this time I went ahead and inserted the OviTex 10X20 cm 1S Perm mesh, via the left subcostal port site. I used the suture needles to  align the mesh centering it to the fascial closure.    The mesh was secured circumferentially to the abdominal wall using 2-0 V-lock in the standard baseball running stitch fashion.   The mesh appeared well secured against the   abdominal wall. A second look laparoscopy revealed no evidence of intra-abdominal injury.    All the needles were removed under direct visualization.  The instruments were removed and the robot was undocked.    The fascial sutures approximated in the standard fashion,  utilizing the PMI under direct visualization. The laparoscopic ports were removed under direct visualization and the pneumoperitoneum was deflated. Incisions were closed with  4-0 Monocryl   Dermabond was used to coat the skin.  Patient tolerated procedure well and there were no immediate complications. Needle and laparotomy counts were correct   Abdominal binder applied.  [name] M.D., [facility] 05/14/2023 11:22 AM

## 2023-05-14 NOTE — Progress Notes (Signed)
 Pharmacy delivered pt's home medications.  Pt has medications in belonging bag and taking home with her.

## 2023-05-14 NOTE — Anesthesia Procedure Notes (Signed)
 Procedure Name: Intubation Date/Time: 05/14/2023 7:41 AM  Performed by: Donnamae Gaba, CRNAPre-anesthesia Checklist: Patient identified, Emergency Drugs available, Suction available and Patient being monitored Patient Re-evaluated:Patient Re-evaluated prior to induction Oxygen Delivery Method: Circle system utilized Preoxygenation: Pre-oxygenation with 100% oxygen Induction Type: IV induction, Rapid sequence and Cricoid Pressure applied Laryngoscope Size: McGrath and 3 Grade View: Grade I Tube type: Oral Tube size: 7.0 mm Number of attempts: 1 Airway Equipment and Method: Stylet Placement Confirmation: ETT inserted through vocal cords under direct vision, positive ETCO2 and breath sounds checked- equal and bilateral Secured at: 2 cm Tube secured with: Tape Dental Injury: Teeth and Oropharynx as per pre-operative assessment

## 2023-05-15 ENCOUNTER — Encounter: Payer: Self-pay | Admitting: Surgery

## 2023-05-26 ENCOUNTER — Other Ambulatory Visit: Payer: Self-pay

## 2023-05-26 ENCOUNTER — Other Ambulatory Visit: Payer: Self-pay | Admitting: Family Medicine

## 2023-05-26 ENCOUNTER — Other Ambulatory Visit: Payer: Self-pay | Admitting: Surgery

## 2023-05-26 ENCOUNTER — Telehealth: Payer: Self-pay

## 2023-05-26 DIAGNOSIS — R110 Nausea: Secondary | ICD-10-CM

## 2023-05-26 MED ORDER — IBUPROFEN 800 MG PO TABS
800.0000 mg | ORAL_TABLET | Freq: Three times a day (TID) | ORAL | 0 refills | Status: DC | PRN
  Filled 2023-05-26: qty 30, 10d supply, fill #0

## 2023-05-26 MED ORDER — OXYCODONE HCL 5 MG PO TABS
5.0000 mg | ORAL_TABLET | Freq: Four times a day (QID) | ORAL | 0 refills | Status: DC | PRN
  Filled 2023-05-26: qty 15, 4d supply, fill #0

## 2023-05-26 MED FILL — Albuterol Sulfate Inhal Aero 108 MCG/ACT (90MCG Base Equiv): RESPIRATORY_TRACT | 16 days supply | Qty: 18 | Fill #1 | Status: AC

## 2023-05-26 NOTE — Telephone Encounter (Signed)
 Patient called stating needing medication for pain from surgery.  Please f/u with patient

## 2023-05-26 NOTE — Progress Notes (Signed)
 Refill for post-op Pain.

## 2023-05-27 ENCOUNTER — Other Ambulatory Visit: Payer: Self-pay

## 2023-05-27 NOTE — Telephone Encounter (Signed)
 Requested medications are due for refill today.  yes  Requested medications are on the active medications list.  yes  Last refill. 04/24/2023 #20 0 rf  Future visit scheduled.   no  Notes to clinic.  Refill not delegated.    Requested Prescriptions  Pending Prescriptions Disp Refills   ondansetron  (ZOFRAN ) 4 MG tablet 20 tablet 0    Sig: Take 1 tablet (4 mg total) by mouth every 8 (eight) hours as needed for nausea or vomiting.     Not Delegated - Gastroenterology: Antiemetics - ondansetron  Failed - 05/27/2023  5:17 PM      Failed - This refill cannot be delegated      Failed - AST in normal range and within 360 days    AST  Date Value Ref Range Status  04/30/2023 67 (H) 15 - 41 U/L Final   SGOT(AST)  Date Value Ref Range Status  04/16/2013 38 (H) 15 - 37 Unit/L Final         Failed - ALT in normal range and within 360 days    ALT  Date Value Ref Range Status  04/30/2023 86 (H) 0 - 44 U/L Final   SGPT (ALT)  Date Value Ref Range Status  04/16/2013 57 12 - 78 U/L Final         Passed - Valid encounter within last 6 months    Recent Outpatient Visits           2 months ago Impacted cerumen of right ear   [facility], Asencion Blacksmith, DO

## 2023-05-28 ENCOUNTER — Other Ambulatory Visit: Payer: Self-pay

## 2023-06-02 NOTE — Progress Notes (Signed)
 [facility] POST-OP OFFICE VISIT  06/03/2023  HPI: Stacy Gilmore is a 63 y.o. female who had surgery on  May 14, 2023 , now s/p robotic ventral hernia repair with OviTex mesh.   Still has a fair amount of pain moving about, seems to be compliant wearing her abdominal binder.  Unable to lift legs sufficiently to get into bathtub for showering, so is still doing split bath bathing.  She is cut her own hair.  She denies any nausea, vomiting, fevers, chills or diarrhea.  She reports a good appetite and her bowel movements are good.  She reports getting complemented about the hernia being fixed.  Vital signs: BP 132/74   Pulse (!) 44   Temp 97.8 F (36.6 C) (Oral)   Ht 5' 6.5" (1.689 m)   Wt 162 lb (73.5 kg)   SpO2 99%   BMI 25.76 kg/m    Physical Exam: Constitutional: She appears well.  Nontoxic.  No distress. Abdomen: Palpable bump at old hernia site, nontender, consistent with old hernia sac. Skin: Incisions are clean dry and intact.  Assessment/Plan: This is a 63 y.o. female who had surgery on  May 14, 2023 , now s/p robotic ventral hernia repair with OviTex mesh.    Patient Active Problem List   Diagnosis Date Noted   Vasomotor symptoms due to menopause 03/17/2023   History of abnormal cervical Pap smear 03/17/2023   Acute non-recurrent frontal sinusitis 03/04/2023   Nausea 03/04/2023   Impacted cerumen of right ear 03/04/2023   Moderate recurrent major depression (HCC) 02/11/2023   Encounter for smoking cessation counseling 02/11/2023   Hearing loss of both ears due to cerumen impaction 02/11/2023   Chronic pain of multiple joints 02/11/2023   Incisional hernia, without obstruction or gangrene 01/28/2023   Non-recurrent bilateral inguinal hernia without obstruction or gangrene 01/28/2023   Status post partial colectomy 11/21/2022   Elevated LFTs 11/12/2022   Electrolyte abnormality 11/12/2022   Gastroesophageal reflux disease without esophagitis  06/27/2022   Essential hypertension 06/27/2022   Protein-calorie malnutrition, moderate (HCC) 06/27/2022   Dysuria 06/19/2022   Coronary atherosclerosis 02/07/2022   Aortic atherosclerosis (HCC) 02/07/2022   Diarrhea 02/06/2022   Cocaine use 02/06/2022   Generalized joint pain 01/17/2022   Health care maintenance 01/17/2022   Encounter to establish care 12/11/2021   History of asthma 12/11/2021   Nicotine  dependence, cigarettes, in remission 12/11/2021   Chronic obstructive pulmonary disease (HCC) 12/11/2021    - She is to continue wearing her binder, and avoid heavy lifting.  She reports she has to do everything herself due to not having any help at home.  She reports she is still not smoking, and I made it clear that she should never resume smoking.  I encouraged her that her pain will continue to prove over the next weeks and we will see her back in a month. 20 minutes.   [name] M.D., FACS 06/03/2023, 11:12 AM

## 2023-06-03 ENCOUNTER — Ambulatory Visit (INDEPENDENT_AMBULATORY_CARE_PROVIDER_SITE_OTHER): Admitting: Surgery

## 2023-06-03 ENCOUNTER — Other Ambulatory Visit: Payer: Self-pay

## 2023-06-03 ENCOUNTER — Encounter: Payer: Self-pay | Admitting: Surgery

## 2023-06-03 ENCOUNTER — Other Ambulatory Visit: Payer: Self-pay | Admitting: Surgery

## 2023-06-03 VITALS — BP 132/74 | HR 44 | Temp 97.8°F | Ht 66.5 in | Wt 162.0 lb

## 2023-06-03 DIAGNOSIS — K432 Incisional hernia without obstruction or gangrene: Secondary | ICD-10-CM

## 2023-06-03 DIAGNOSIS — Z09 Encounter for follow-up examination after completed treatment for conditions other than malignant neoplasm: Secondary | ICD-10-CM

## 2023-06-03 DIAGNOSIS — Z8719 Personal history of other diseases of the digestive system: Secondary | ICD-10-CM | POA: Insufficient documentation

## 2023-06-03 MED ORDER — OXYCODONE HCL 5 MG PO TABS
5.0000 mg | ORAL_TABLET | Freq: Four times a day (QID) | ORAL | 0 refills | Status: DC | PRN
  Filled 2023-06-03: qty 15, 4d supply, fill #0

## 2023-06-03 MED ORDER — IBUPROFEN 800 MG PO TABS
800.0000 mg | ORAL_TABLET | Freq: Three times a day (TID) | ORAL | 0 refills | Status: DC | PRN
  Filled 2023-06-03: qty 30, 10d supply, fill #0

## 2023-06-03 NOTE — Progress Notes (Signed)
 Rx only

## 2023-06-03 NOTE — Patient Instructions (Signed)
 Please call the office if you have any questions or concerns.    GENERAL POST-OPERATIVE PATIENT INSTRUCTIONS   WOUND CARE INSTRUCTIONS:  Keep a dry clean dressing on the wound if there is drainage. The initial bandage may be removed after 24 hours.  Once the wound has quit draining you may leave it open to air.  If clothing rubs against the wound or causes irritation and the wound is not draining you may cover it with a dry dressing during the daytime.  Try to keep the wound dry and avoid ointments on the wound unless directed to do so.  If the wound becomes bright red and painful or starts to drain infected material that is not clear, please contact your physician immediately.  If the wound is mildly pink and has a thick firm ridge underneath it, this is normal, and is referred to as a healing ridge.  This will resolve over the next 4-6 weeks.  BATHING: You may shower if you have been informed of this by your surgeon. However, Please do not submerge in a tub, hot tub, or pool until incisions are completely sealed or have been told by your surgeon that you may do so.  DIET:  You may eat any foods that you can tolerate.  It is a good idea to eat a high fiber diet and take in plenty of fluids to prevent constipation.  If you do become constipated you may want to take a mild laxative or take ducolax tablets on a daily basis until your bowel habits are regular.  Constipation can be very uncomfortable, along with straining, after recent surgery.  ACTIVITY:  You are encouraged to cough and deep breath or use your incentive spirometer if you were given one, every 15-30 minutes when awake.  This will help prevent respiratory complications and low grade fevers post-operatively if you had a general anesthetic.  You may want to hug a pillow when coughing and sneezing to add additional support to the surgical area, if you had abdominal or chest surgery, which will decrease pain during these times.  You are  encouraged to walk and engage in light activity for the next two weeks.  You should not lift more than 20 pounds for 6 weeks total after surgery as it could put you at increased risk for complications.  Twenty pounds is roughly equivalent to a plastic bag of groceries. At that time- Listen to your body when lifting, if you have pain when lifting, stop and then try again in a few days. Soreness after doing exercises or activities of daily living is normal as you get back in to your normal routine.  MEDICATIONS:  Try to take narcotic medications and anti-inflammatory medications, such as tylenol , ibuprofen , naprosyn, etc., with food.  This will minimize stomach upset from the medication.  Should you develop nausea and vomiting from the pain medication, or develop a rash, please discontinue the medication and contact your physician.  You should not drive, make important decisions, or operate machinery when taking narcotic pain medication.  SUNBLOCK Use sun block to incision area over the next year if this area will be exposed to sun. This helps decrease scarring and will allow you avoid a permanent darkened area over your incision.  QUESTIONS:  Please feel free to call our office if you have any questions, and we will be glad to assist you. [phone]

## 2023-06-05 NOTE — Progress Notes (Unsigned)
 Carlean Charter, DO   No chief complaint on file.   HPI:      Ms. Stacy Gilmore is a 63 y.o. [id number] whose LMP was No LMP recorded. Patient is postmenopausal., presents today for  Given age, not starting HRT  03/17/23 NOTE: NP eval of VS sx and due for pap smear. Last pap about 15 yrs ago; hx of abn pap with bx at that time, no treatment needed per pt. Was supposed to have repeat pap but not done.  Menopause in 50s, no PMB. Does have significant VS sx, particularly at night. Never did HRT. Interested in tx. Hx of elevated LFTs 10/24 but labs returned to normal once pt's statin changed.  Pt is rarely sexually active, just not interested. Was a couple days ago with some dryness, didn't use lubricants. No vaginal pain/bleeding. Does have some pelvic discomfort due to sex but has several hernias and s/p colon resection 9/24. Has long hx of decreased libido and fatigue. Has rheumatology appt later this month for possible RA vs auto-immune disease due to 1/25 labs with PCP.  PCP ordered mammo at 1/25 appt.    Patient Active Problem List   Diagnosis Date Noted   Status post laparoscopic hernia repair 06/03/2023   Vasomotor symptoms due to menopause 03/17/2023   History of abnormal cervical Pap smear 03/17/2023   Acute non-recurrent frontal sinusitis 03/04/2023   Nausea 03/04/2023   Impacted cerumen of right ear 03/04/2023   Moderate recurrent major depression (HCC) 02/11/2023   Encounter for smoking cessation counseling 02/11/2023   Hearing loss of both ears due to cerumen impaction 02/11/2023   Chronic pain of multiple joints 02/11/2023   Non-recurrent bilateral inguinal hernia without obstruction or gangrene 01/28/2023   Status post partial colectomy 11/21/2022   Elevated LFTs 11/12/2022   Electrolyte abnormality 11/12/2022   Gastroesophageal reflux disease without esophagitis 06/27/2022   Essential hypertension 06/27/2022   Protein-calorie malnutrition, moderate (HCC)  06/27/2022   Dysuria 06/19/2022   Coronary atherosclerosis 02/07/2022   Aortic atherosclerosis (HCC) 02/07/2022   Diarrhea 02/06/2022   Cocaine use 02/06/2022   Generalized joint pain 01/17/2022   Health care maintenance 01/17/2022   Encounter to establish care 12/11/2021   History of asthma 12/11/2021   Nicotine  dependence, cigarettes, in remission 12/11/2021   Chronic obstructive pulmonary disease (HCC) 12/11/2021    Past Surgical History:  Procedure Laterality Date   APPENDECTOMY N/A 10/04/2022   Procedure: APPENDECTOMY;  Surgeon: [name], MD;  Location: [facility];  Service: General;  Laterality: N/A;   CERVICAL SPINE SURGERY  2013   CHOLECYSTECTOMY  2013   COLON RESECTION SIGMOID N/A 10/04/2022   Procedure: COLON RESECTION SIGMOID;  Surgeon: [name], MD;  Location: [facility];  Service: General;  Laterality: N/A;  Splenic Flexure Takedown   INSERTION OF MESH  05/14/2023   Procedure: INSERTION OF MESH;  Surgeon: [name], MD;  Location: [facility];  Service: General;;   LAPAROTOMY N/A 10/04/2022   Procedure: EXPLORATION LAPAROTOMY;  Surgeon: [name], MD;  Location: [facility];  Service: General;  Laterality: N/A;   TUBAL LIGATION     XI ROBOTIC ASSISTED VENTRAL HERNIA N/A 05/14/2023   Procedure: REPAIR, HERNIA, VENTRAL, ROBOT-ASSISTED;  Surgeon: [name], MD;  Location: [facility];  Service: General;  Laterality: N/A;    Family History  Problem Relation Age of Onset   Anxiety disorder Mother    Lung cancer Mother    Thyroid disease Mother    Epilepsy  Mother    Other Father        unknown medical history   Breast cancer Sister        unknown age   Cervical cancer Sister        unknown age   Epilepsy Brother    Cancer Paternal Uncle        unknown type   Other Maternal Grandmother        unknown medical history   Other Maternal Grandfather        unknown medical history   Diabetes Paternal Grandmother    Other Paternal Grandfather         unknown medical history    Social History   Socioeconomic History   Marital status: Single    Spouse name: Not on file   Number of children: Not on file   Years of education: Not on file   Highest education level: Not on file  Occupational History   Not on file  Tobacco Use   Smoking status: Former    Current packs/day: 1.00    Average packs/day: 1 pack/day for 40.0 years (40.0 ttl pk-yrs)    Types: Cigarettes    Passive exposure: Past   Smokeless tobacco: Never   Tobacco comments:    [place] Quit info given, patient down to 2 cigarettes per day  Vaping Use   Vaping status: Never Used  Substance and Sexual Activity   Alcohol use: Yes    Alcohol/week: 6.0 standard drinks of alcohol    Types: 6 Cans of beer per week    Comment: 06/19/22 drank a 40oz beer a few days ago, 1/2 of a 15 pack of beer weekly, last use ~ 02/04/22   Drug use: Not Currently    Types: "Crack" cocaine, Cocaine, Methamphetamines, Marijuana    Comment: smokes Marijuana once per week-last use 01/2022, last use crack cocaine 12/08/21   Sexual activity: Not Currently  Other Topics Concern   Not on file  Social History Narrative   Not on file   Social Drivers of Health   Financial Resource Strain: High Risk (03/19/2022)   Overall Financial Resource Strain (CARDIA)    Difficulty of Paying Living Expenses: Very hard  Food Insecurity: Food Insecurity Present (12/04/2022)   Hunger Vital Sign    Worried About Running Out of Food in the Last Year: Sometimes true    Ran Out of Food in the Last Year: Sometimes true  Transportation Needs: Unmet Transportation Needs (03/26/2023)   PRAPARE - Administrator, Civil Service (Medical): Yes    Lack of Transportation (Non-Medical): No  Physical Activity: Inactive (03/19/2022)   Exercise Vital Sign    Days of Exercise per Week: 0 days    Minutes of Exercise per Session: 0 min  Stress: Stress Concern Present (03/19/2022)   [employer] of Occupational Health  - Occupational Stress Questionnaire    Feeling of Stress : Very much  Social Connections: Socially Isolated (03/19/2022)   Social Connection and Isolation Panel [NHANES]    Frequency of Communication with Friends and Family: Never    Frequency of Social Gatherings with Friends and Family: Never    Attends Religious Services: Never    Database administrator or Organizations: No    Attends Engineer, structural: Not on file    Marital Status: Never married  Intimate Partner Violence: Not At Risk (11/15/2022)   Humiliation, Afraid, Rape, and Kick questionnaire    Fear of Current or Ex-Partner:  No    Emotionally Abused: No    Physically Abused: No    Sexually Abused: No    Outpatient Medications Prior to Visit  Medication Sig Dispense Refill   albuterol  (VENTOLIN  HFA) 108 (90 Base) MCG/ACT inhaler Inhale 2 puffs into the lungs every 4 (four) hours as needed for wheezing or shortness of breath. 18 g 5   atorvastatin  (LIPITOR) 10 MG tablet Take 1 tablet (10 mg total) by mouth daily. 90 tablet 3   budesonide -formoterol  (SYMBICORT ) 160-4.5 MCG/ACT inhaler Inhale 2 puffs into the lungs 2 (two) times daily. 10.2 g 3   Fezolinetant  (VEOZAH ) 45 MG TABS Take 1 tablet (45 mg total) by mouth daily. 30 tablet 2   ibuprofen  (ADVIL ) 800 MG tablet Take 1 tablet (800 mg total) by mouth every 8 (eight) hours as needed. 30 tablet 0   omeprazole  (PRILOSEC) 20 MG capsule Take 1 capsule (20 mg total) by mouth daily. 30 capsule 3   ondansetron  (ZOFRAN ) 4 MG tablet Take 1 tablet (4 mg total) by mouth every 8 (eight) hours as needed for nausea or vomiting. 20 tablet 0   oxyCODONE  (OXY IR/ROXICODONE ) 5 MG immediate release tablet Take 1 tablet (5 mg total) by mouth every 6 (six) hours as needed for severe pain (pain score 7-10). 15 tablet 0   polyethylene glycol powder (GLYCOLAX /MIRALAX ) 17 GM/SCOOP powder Take 17 g by mouth daily. Mix as directed. 238 g 0   senna-docusate (SENOKOT-S) 8.6-50 MG tablet Take 1  tablet by mouth at bedtime as needed for mild constipation. 30 tablet 1   No facility-administered medications prior to visit.      ROS:  Review of Systems  Constitutional:  Negative for fever.  Gastrointestinal:  Negative for blood in stool, constipation, diarrhea, nausea and vomiting.  Genitourinary:  Negative for dyspareunia, dysuria, flank pain, frequency, hematuria, urgency, vaginal bleeding, vaginal discharge and vaginal pain.  Musculoskeletal:  Negative for back pain.  Skin:  Negative for rash.   BREAST: No symptoms   OBJECTIVE:   Vitals:  There were no vitals taken for this visit.  Physical Exam Vitals reviewed.  Constitutional:      Appearance: She is well-developed.  Pulmonary:     Effort: Pulmonary effort is normal.  Abdominal:     Palpations: Abdomen is soft.     Tenderness: There is abdominal tenderness in the suprapubic area. There is guarding. There is no rebound.     Hernia: A hernia is present. Hernia is present in the ventral area.  Genitourinary:    General: Normal vulva.     Pubic Area: No rash.      Labia:        Right: No rash, tenderness or lesion.        Left: No rash, tenderness or lesion.      Vagina: Normal. No vaginal discharge, erythema or tenderness.     Cervix: Normal.     Uterus: Normal. Not enlarged and not tender.      Adnexa: Right adnexa normal and left adnexa normal.       Right: No mass or tenderness.         Left: No mass or tenderness.       Comments: MOD VAG ATROPHY Musculoskeletal:        General: Normal range of motion.     Cervical back: Normal range of motion.  Skin:    General: Skin is warm and dry.  Neurological:     General: No focal deficit  present.     Mental Status: She is alert and oriented to person, place, and time.  Psychiatric:        Mood and Affect: Mood normal.        Behavior: Behavior normal.        Thought Content: Thought content normal.        Judgment: Judgment normal.     Assessment/Plan: Cervical cancer screening - Plan: Cytology - PAP  Screening for HPV (human papillomavirus) - Plan: Cytology - PAP  History of abnormal cervical Pap smear--repeat pap today. Will f/u if abn.   Vasomotor symptoms due to menopause - Plan: Fezolinetant  (VEOZAH ) 45 MG TABS; not candidate for HRT. Discussed veozah  although [facility] most likely won't cover. Rx eRxd to try. If is covered, pt to check CMP Qmo for 3 months, orders placed. If not covered, pt can try estroven for sx.   Decreased libido--discussed lack of tx options for sx. Could also be due to fatigue and pt being eval by rheumatology for auto-immune sx soon. F/u prn.   Pt's questions answered.   No orders of the defined types were placed in this encounter.     No follow-ups on file.  Brix Brearley B. Asmar Brozek, PA-C 06/05/2023 3:54 PM

## 2023-06-10 ENCOUNTER — Other Ambulatory Visit (HOSPITAL_COMMUNITY): Payer: Self-pay

## 2023-06-10 ENCOUNTER — Other Ambulatory Visit: Payer: Self-pay

## 2023-06-10 ENCOUNTER — Encounter: Admitting: Obstetrics and Gynecology

## 2023-06-10 ENCOUNTER — Other Ambulatory Visit: Payer: Self-pay | Admitting: Family Medicine

## 2023-06-10 ENCOUNTER — Encounter: Payer: Self-pay | Admitting: Obstetrics and Gynecology

## 2023-06-10 ENCOUNTER — Other Ambulatory Visit: Payer: Self-pay | Admitting: Surgery

## 2023-06-10 DIAGNOSIS — K219 Gastro-esophageal reflux disease without esophagitis: Secondary | ICD-10-CM

## 2023-06-10 MED ORDER — OMEPRAZOLE 20 MG PO CPDR
20.0000 mg | DELAYED_RELEASE_CAPSULE | Freq: Every day | ORAL | 3 refills | Status: DC
  Filled 2023-06-10 (×2): qty 30, 30d supply, fill #0
  Filled 2023-07-28: qty 30, 30d supply, fill #1

## 2023-06-10 MED ORDER — HYDROXYCHLOROQUINE SULFATE 200 MG PO TABS
200.0000 mg | ORAL_TABLET | Freq: Two times a day (BID) | ORAL | 5 refills | Status: AC
Start: 2023-06-10 — End: ?
  Filled 2023-06-10 (×3): qty 60, 30d supply, fill #0
  Filled 2023-07-28: qty 60, 30d supply, fill #1
  Filled 2023-09-11: qty 60, 30d supply, fill #2
  Filled 2023-11-06: qty 60, 30d supply, fill #0
  Filled 2023-11-06: qty 60, 30d supply, fill #3
  Filled 2023-11-17: qty 60, 30d supply, fill #0

## 2023-06-10 MED ORDER — PREDNISONE 5 MG PO TABS
ORAL_TABLET | ORAL | 0 refills | Status: AC
  Filled 2023-06-10 (×2): qty 70, 28d supply, fill #0

## 2023-06-11 ENCOUNTER — Other Ambulatory Visit: Payer: Self-pay

## 2023-06-12 ENCOUNTER — Other Ambulatory Visit: Payer: Self-pay

## 2023-06-12 ENCOUNTER — Other Ambulatory Visit: Payer: Self-pay | Admitting: Surgery

## 2023-06-12 ENCOUNTER — Telehealth: Payer: Self-pay | Admitting: Surgery

## 2023-06-12 MED ORDER — OXYCODONE HCL 5 MG PO TABS
5.0000 mg | ORAL_TABLET | Freq: Four times a day (QID) | ORAL | 0 refills | Status: DC | PRN
  Filled 2023-06-12 – 2023-06-25 (×2): qty 15, 4d supply, fill #0

## 2023-06-12 MED ORDER — IBUPROFEN 800 MG PO TABS
800.0000 mg | ORAL_TABLET | Freq: Three times a day (TID) | ORAL | 0 refills | Status: DC | PRN
  Filled 2023-06-12 – 2023-06-25 (×2): qty 30, 10d supply, fill #0

## 2023-06-12 NOTE — Telephone Encounter (Signed)
 Patient had robotic ventral hernia repair done on 05/14/23.  Is requesting refill on her Oxycodone  and Rx for Ibuprofen .  Please call patient to advise if can get refilled.

## 2023-06-12 NOTE — Progress Notes (Signed)
 Apparently didn't know Dr. Athena Bland had ordered her meds on the 20th.... so I have cx'd them to refill the Oxycodone  and Ibuprofen  today.

## 2023-06-23 ENCOUNTER — Other Ambulatory Visit: Payer: Self-pay

## 2023-06-25 ENCOUNTER — Other Ambulatory Visit: Payer: Self-pay

## 2023-06-25 MED FILL — Albuterol Sulfate Inhal Aero 108 MCG/ACT (90MCG Base Equiv): RESPIRATORY_TRACT | 16 days supply | Qty: 18 | Fill #2 | Status: AC

## 2023-07-02 NOTE — Progress Notes (Signed)
 [facility] POST-OP OFFICE VISIT  07/03/2023  HPI: Stacy Gilmore is a 63 y.o. female who had surgery on  May 14, 2023 , now s/p robotic ventral hernia repair with OviTex mesh.   Still admits her pain control does not require pain medication but seems to like having a dose of oxycodone  at night.  She also likes the prescription ibuprofen , but would like some assistance with sleeping.  She continues to be compliant wearing her abdominal binder.  And reports the Velcro not holding as well.  Seems to be much more mobile than she was before.  She denies any nausea, vomiting, fevers, chills or diarrhea.  She reports a good appetite and her bowel movements are good.  She remains in quite good spirits.   Vital signs: BP (!) 180/77   Pulse 68   Temp 97.6 F (36.4 C) (Oral)   Ht 5' 6.5 (1.689 m)   Wt 161 lb 9.6 oz (73.3 kg)   SpO2 97%   BMI 25.69 kg/m    Physical Exam: Constitutional: She appears well.  Nontoxic.  No distress. Abdomen: Palpable bump at old hernia site, persists approximately 4 to 5 cm in diameter, consistent with seroma, not reducible, nontender, consistent with old hernia sac, under the midline scar cephalad to the umbilical area. Skin: Incisions are well-healed without any other evidence of defect.  Assessment/Plan: This is a 63 y.o. female who had surgery on  May 14, 2023 , now s/p robotic ventral hernia repair with OviTex mesh.  Lingering subcutaneous seroma, likely from residual hernia sac.  No evidence of recurrent hernia.  Patient Active Problem List   Diagnosis Date Noted   Status post laparoscopic hernia repair 06/03/2023   Vasomotor symptoms due to menopause 03/17/2023   History of abnormal cervical Pap smear 03/17/2023   Acute non-recurrent frontal sinusitis 03/04/2023   Nausea 03/04/2023   Impacted cerumen of right ear 03/04/2023   Moderate recurrent major depression (HCC) 02/11/2023   Encounter for smoking cessation counseling  02/11/2023   Hearing loss of both ears due to cerumen impaction 02/11/2023   Chronic pain of multiple joints 02/11/2023   Non-recurrent bilateral inguinal hernia without obstruction or gangrene 01/28/2023   Status post partial colectomy 11/21/2022   Elevated LFTs 11/12/2022   Electrolyte abnormality 11/12/2022   Gastroesophageal reflux disease without esophagitis 06/27/2022   Essential hypertension 06/27/2022   Protein-calorie malnutrition, moderate (HCC) 06/27/2022   Dysuria 06/19/2022   Coronary atherosclerosis 02/07/2022   Aortic atherosclerosis (HCC) 02/07/2022   Diarrhea 02/06/2022   Cocaine use 02/06/2022   Generalized joint pain 01/17/2022   Health care maintenance 01/17/2022   Encounter to establish care 12/11/2021   History of asthma 12/11/2021   Nicotine  dependence, cigarettes, in remission 12/11/2021   Chronic obstructive pulmonary disease (HCC) 12/11/2021    - She she may reduce her wearing of her binder, and continue to avoid heavy lifting.  She reports she has to do everything herself due to not having any help at home.  She reports she is still not smoking, and I made it clear that she should never resume smoking.  I will give her a limited amount of Ambien  to help her with any continuing insomnia.  Will have her follow-up in 6 months or as needed.   Honor Leghorn M.D., FACS 07/03/2023, 9:42 AM

## 2023-07-03 ENCOUNTER — Encounter: Payer: Self-pay | Admitting: Surgery

## 2023-07-03 ENCOUNTER — Ambulatory Visit (INDEPENDENT_AMBULATORY_CARE_PROVIDER_SITE_OTHER): Admitting: Surgery

## 2023-07-03 ENCOUNTER — Other Ambulatory Visit: Payer: Self-pay

## 2023-07-03 VITALS — BP 180/77 | HR 68 | Temp 97.6°F | Ht 66.5 in | Wt 161.6 lb

## 2023-07-03 DIAGNOSIS — Z09 Encounter for follow-up examination after completed treatment for conditions other than malignant neoplasm: Secondary | ICD-10-CM | POA: Diagnosis not present

## 2023-07-03 DIAGNOSIS — K432 Incisional hernia without obstruction or gangrene: Secondary | ICD-10-CM

## 2023-07-03 DIAGNOSIS — Z8719 Personal history of other diseases of the digestive system: Secondary | ICD-10-CM

## 2023-07-03 MED ORDER — ZOLPIDEM TARTRATE 5 MG PO TABS
5.0000 mg | ORAL_TABLET | Freq: Every evening | ORAL | 0 refills | Status: DC | PRN
  Filled 2023-07-03: qty 14, 14d supply, fill #0

## 2023-07-03 NOTE — Patient Instructions (Signed)

## 2023-07-22 ENCOUNTER — Ambulatory Visit: Admitting: Infectious Diseases

## 2023-07-28 ENCOUNTER — Other Ambulatory Visit: Payer: Self-pay | Admitting: Family Medicine

## 2023-07-28 ENCOUNTER — Other Ambulatory Visit: Payer: Self-pay

## 2023-07-28 DIAGNOSIS — J439 Emphysema, unspecified: Secondary | ICD-10-CM

## 2023-07-28 DIAGNOSIS — R110 Nausea: Secondary | ICD-10-CM

## 2023-07-28 MED ORDER — PREDNISONE 5 MG PO TABS
5.0000 mg | ORAL_TABLET | Freq: Every day | ORAL | 0 refills | Status: AC
  Filled 2023-07-28: qty 30, 30d supply, fill #0

## 2023-07-28 MED ORDER — IBUPROFEN 800 MG PO TABS
800.0000 mg | ORAL_TABLET | Freq: Three times a day (TID) | ORAL | 1 refills | Status: DC | PRN
  Filled 2023-07-28: qty 90, 30d supply, fill #0
  Filled 2023-08-12 – 2023-08-19 (×3): qty 90, 30d supply, fill #1

## 2023-07-28 MED ORDER — NAPROXEN 500 MG PO TABS
500.0000 mg | ORAL_TABLET | Freq: Two times a day (BID) | ORAL | 2 refills | Status: DC
  Filled 2023-07-28: qty 60, 30d supply, fill #0

## 2023-07-28 MED FILL — Albuterol Sulfate Inhal Aero 108 MCG/ACT (90MCG Base Equiv): RESPIRATORY_TRACT | 16 days supply | Qty: 18 | Fill #3 | Status: AC

## 2023-07-28 MED FILL — Budesonide-Formoterol Fumarate Dihyd Aerosol 160-4.5 MCG/ACT: RESPIRATORY_TRACT | 30 days supply | Qty: 10.2 | Fill #0 | Status: AC

## 2023-07-28 NOTE — Telephone Encounter (Signed)
 Copied from CRM [id number]. Topic: Clinical - Medication Refill >> Jul 28, 2023  8:47 AM Aleatha C wrote: Medication: ondansetron  (ZOFRAN ) 4 MG tablet  Has the patient contacted their pharmacy? No (Agent: If no, request that the patient contact the pharmacy for the refill. If patient does not wish to contact the pharmacy document the reason why and proceed with request.) (Agent: If yes, when and what did the pharmacy advise?)  This is the patient's preferred pharmacy:  [facility] [id number] [place] [address] Phone: [phone] Fax: [phone]  Is this the correct pharmacy for this prescription? Yes If no, delete pharmacy and type the correct one.   Has the prescription been filled recently? No  Is the patient out of the medication? Yes  Has the patient been seen for an appointment in the last year OR does the patient have an upcoming appointment? Yes  Can we respond through MyChart? No  Agent: Please be advised that Rx refills may take up to 3 business days. We ask that you follow-up with your pharmacy.

## 2023-07-29 ENCOUNTER — Other Ambulatory Visit: Payer: Self-pay

## 2023-07-29 NOTE — Telephone Encounter (Signed)
 Requested medication (s) are due for refill today - yes  Requested medication (s) are on the active medication list -yes  Future visit scheduled -yes  Last refill: 04/24/23 #20  Notes to clinic: non delegated Rx  Requested Prescriptions  Pending Prescriptions Disp Refills   ondansetron  (ZOFRAN ) 4 MG tablet 20 tablet 0    Sig: Take 1 tablet (4 mg total) by mouth every 8 (eight) hours as needed for nausea or vomiting.     Not Delegated - Gastroenterology: Antiemetics - ondansetron  Failed - 07/29/2023  3:26 PM      Failed - This refill cannot be delegated      Failed - AST in normal range and within 360 days    AST  Date Value Ref Range Status  04/30/2023 67 (H) 15 - 41 U/L Final   SGOT(AST)  Date Value Ref Range Status  04/16/2013 38 (H) 15 - 37 Unit/L Final         Failed - ALT in normal range and within 360 days    ALT  Date Value Ref Range Status  04/30/2023 86 (H) 0 - 44 U/L Final   SGPT (ALT)  Date Value Ref Range Status  04/16/2013 57 12 - 78 U/L Final         Passed - Valid encounter within last 6 months    Recent Outpatient Visits           4 months ago Impacted cerumen of right ear   [facility], Lauraine SAILOR, DO       Future Appointments             In 2 months Fayette Bodily, MD [facility]               Requested Prescriptions  Pending Prescriptions Disp Refills   ondansetron  (ZOFRAN ) 4 MG tablet 20 tablet 0    Sig: Take 1 tablet (4 mg total) by mouth every 8 (eight) hours as needed for nausea or vomiting.     Not Delegated - Gastroenterology: Antiemetics - ondansetron  Failed - 07/29/2023  3:26 PM      Failed - This refill cannot be delegated      Failed - AST in normal range and within 360 days    AST  Date Value Ref Range Status  04/30/2023 67 (H) 15 - 41 U/L Final   SGOT(AST)  Date Value Ref Range Status  04/16/2013 38 (H) 15 - 37 Unit/L Final         Failed - ALT in  normal range and within 360 days    ALT  Date Value Ref Range Status  04/30/2023 86 (H) 0 - 44 U/L Final   SGPT (ALT)  Date Value Ref Range Status  04/16/2013 57 12 - 78 U/L Final         Passed - Valid encounter within last 6 months    Recent Outpatient Visits           4 months ago Impacted cerumen of right ear   [facility], Lauraine SAILOR, DO       Future Appointments             In 2 months Fayette Bodily, MD [facility]

## 2023-08-01 ENCOUNTER — Other Ambulatory Visit: Payer: Self-pay

## 2023-08-01 MED ORDER — ONDANSETRON HCL 4 MG PO TABS
4.0000 mg | ORAL_TABLET | Freq: Three times a day (TID) | ORAL | 1 refills | Status: DC | PRN
Start: 2023-08-01 — End: 2023-08-11
  Filled 2023-08-01: qty 20, 7d supply, fill #0

## 2023-08-11 ENCOUNTER — Ambulatory Visit (INDEPENDENT_AMBULATORY_CARE_PROVIDER_SITE_OTHER): Admitting: Family Medicine

## 2023-08-11 ENCOUNTER — Other Ambulatory Visit: Payer: Self-pay

## 2023-08-11 ENCOUNTER — Encounter: Payer: Self-pay | Admitting: Family Medicine

## 2023-08-11 VITALS — BP 139/80 | HR 70 | Temp 97.7°F | Ht 65.5 in | Wt 165.0 lb

## 2023-08-11 DIAGNOSIS — R110 Nausea: Secondary | ICD-10-CM | POA: Diagnosis not present

## 2023-08-11 DIAGNOSIS — I10 Essential (primary) hypertension: Secondary | ICD-10-CM | POA: Diagnosis not present

## 2023-08-11 DIAGNOSIS — R030 Elevated blood-pressure reading, without diagnosis of hypertension: Secondary | ICD-10-CM

## 2023-08-11 DIAGNOSIS — G479 Sleep disorder, unspecified: Secondary | ICD-10-CM | POA: Insufficient documentation

## 2023-08-11 DIAGNOSIS — F331 Major depressive disorder, recurrent, moderate: Secondary | ICD-10-CM

## 2023-08-11 DIAGNOSIS — K219 Gastro-esophageal reflux disease without esophagitis: Secondary | ICD-10-CM | POA: Diagnosis not present

## 2023-08-11 DIAGNOSIS — J302 Other seasonal allergic rhinitis: Secondary | ICD-10-CM | POA: Insufficient documentation

## 2023-08-11 MED ORDER — ONDANSETRON HCL 4 MG PO TABS
4.0000 mg | ORAL_TABLET | Freq: Three times a day (TID) | ORAL | 1 refills | Status: DC | PRN
  Filled 2023-08-11: qty 40, 14d supply, fill #0
  Filled 2023-09-11: qty 40, 14d supply, fill #1

## 2023-08-11 MED ORDER — OMEPRAZOLE 20 MG PO CPDR
20.0000 mg | DELAYED_RELEASE_CAPSULE | Freq: Every day | ORAL | 3 refills | Status: DC
  Filled 2023-08-11: qty 30, 30d supply, fill #0
  Filled 2023-08-19: qty 90, 90d supply, fill #0

## 2023-08-11 MED ORDER — BLOOD PRESSURE MONITOR MISC: 1.0000 | Freq: Once | 0 refills | Status: AC

## 2023-08-11 MED ORDER — FAMOTIDINE 20 MG PO TABS
20.0000 mg | ORAL_TABLET | Freq: Every day | ORAL | 1 refills | Status: DC | PRN
  Filled 2023-08-11: qty 30, 30d supply, fill #0
  Filled 2023-09-11: qty 30, 30d supply, fill #1

## 2023-08-11 MED ORDER — CETIRIZINE HCL 10 MG PO TABS
10.0000 mg | ORAL_TABLET | Freq: Every day | ORAL | 3 refills | Status: AC
Start: 2023-08-11 — End: ?
  Filled 2023-08-11: qty 30, 30d supply, fill #0
  Filled 2023-09-11: qty 30, 30d supply, fill #1
  Filled 2023-11-06: qty 30, 30d supply, fill #2
  Filled 2023-11-06 – 2023-11-17 (×2): qty 30, 30d supply, fill #0

## 2023-08-11 MED ORDER — SERTRALINE HCL 50 MG PO TABS
ORAL_TABLET | ORAL | 1 refills | Status: DC
  Filled 2023-08-11: qty 30, 34d supply, fill #0

## 2023-08-11 NOTE — Progress Notes (Signed)
 Established patient visit   Patient: Stacy Gilmore   DOB: Jul 12, 1960   63 y.o. Female  MRN: [id number] Visit Date: 08/11/2023  Today's healthcare provider: LAURAINE LOISE BUOY, DO   Chief Complaint  Patient presents with   Hypertension   Nausea    Patient has had nausea for about 3 weeks.      Insomnia    Patient states she has not been able to sleep for the last few days   Ear Pain    Patient has had pain for about 2 weeks with increased itching in the ear the last few days.   Gastroesophageal Reflux    Patient would like her Omeprazole  changed to a 90 day refill   Subjective    HPI Stacy Gilmore is a 63 year old female with rheumatoid arthritis who presents with persistent nausea and ear discomfort.  She has experienced persistent nausea for the past three weeks, occurring even after meals and requiring Zofran  almost daily, sometimes needing two tablets. She has requested a refill for Zofran .  She reports discomfort in her right ear, characterized by itching and occasional ringing, associated with pressure changes, especially before it rains.  She has difficulty sleeping, often tossing and turning at night. She was previously prescribed 14 tablets of Ambien , which she used sparingly, making a supply last for three months. She attributes some of her sleep issues to situational stress, including her partner's recent hospitalization.  She has a history of rheumatoid arthritis, for which she takes hydroxychloroquine  twice daily and prednisone  once daily. She also uses ibuprofen  for pain management, which helps but does not completely alleviate her pain, particularly in her hands.  Her medication regimen includes omeprazole  every morning to manage acid reflux. Without it, she experiences burning and discomfort that radiates to her throat and ear. She also takes atorvastatin  daily for cholesterol management and uses Symbicort  once daily for respiratory issues, with albuterol   as needed.  She experiences hot flashes, which she attributes to menopause, and has been unable to obtain Veozah  due to insurance coverage issues. She has not been on any hormone therapy for these symptoms.  She has a history of high blood pressure, which is currently not being treated with medication. She does not have a blood pressure cuff at home to monitor her levels and lacks the finances to obtain at this time.  She experiences seasonal allergies, primarily in the spring and summer, and has used over-the-counter Benadryl  in the past. She has not been on a regular allergy medication regimen due to cost constraints.       Medications: Outpatient Medications Prior to Visit  Medication Sig   albuterol  (VENTOLIN  HFA) 108 (90 Base) MCG/ACT inhaler Inhale 2 puffs into the lungs every 4 (four) hours as needed for wheezing or shortness of breath.   atorvastatin  (LIPITOR) 10 MG tablet Take 1 tablet (10 mg total) by mouth daily.   budesonide -formoterol  (SYMBICORT ) 160-4.5 MCG/ACT inhaler Inhale 2 puffs into the lungs 2 (two) times daily.   hydroxychloroquine  (PLAQUENIL ) 200 MG tablet Take 1 tablet (200 mg total) by mouth 2 (two) times daily.   ibuprofen  (ADVIL ) 800 MG tablet Take 1 tablet (800 mg total) by mouth every 8 (eight) hours as needed for Pain. Take with Food   polyethylene glycol powder (GLYCOLAX /MIRALAX ) 17 GM/SCOOP powder Take 17 g by mouth daily. Mix as directed.   predniSONE  (DELTASONE ) 5 MG tablet Take 1 tablet (5 mg total) by mouth daily.  senna-docusate (SENOKOT-S) 8.6-50 MG tablet Take 1 tablet by mouth at bedtime as needed for mild constipation.   [DISCONTINUED] Fezolinetant  (VEOZAH ) 45 MG TABS Take 1 tablet (45 mg total) by mouth daily.   [DISCONTINUED] omeprazole  (PRILOSEC) 20 MG capsule Take 1 capsule (20 mg total) by mouth daily.   [DISCONTINUED] ondansetron  (ZOFRAN ) 4 MG tablet Take 1 tablet (4 mg total) by mouth every 8 (eight) hours as needed for nausea or vomiting.    [DISCONTINUED] zolpidem  (AMBIEN ) 5 MG tablet Take 1 tablet (5 mg total) by mouth at bedtime as needed for sleep. (Patient not taking: Reported on 08/11/2023)   No facility-administered medications prior to visit.        Objective    BP 139/80 (BP Location: Left Arm, Patient Position: Sitting, Cuff Size: Normal)   Pulse 70   Temp 97.7 F (36.5 C) (Oral)   Ht 5' 5.5 (1.664 m)   Wt 165 lb (74.8 kg)   SpO2 96%   BMI 27.04 kg/m     Physical Exam Vitals and nursing note reviewed.  Constitutional:      General: She is not in acute distress.    Appearance: Normal appearance.  HENT:     Head: Normocephalic and atraumatic.     Right Ear: Tympanic membrane and external ear normal. There is no impacted cerumen.     Left Ear: Tympanic membrane, ear canal and external ear normal. There is no impacted cerumen.     Ears:     Comments: Small amount of non-impacted cerumen impedes partially view of right TM through narrow ear canal.    Nose: No congestion or rhinorrhea.     Mouth/Throat:     Mouth: Mucous membranes are moist.     Pharynx: Oropharynx is clear.  Eyes:     General: No scleral icterus.    Conjunctiva/sclera: Conjunctivae normal.  Cardiovascular:     Rate and Rhythm: Normal rate.  Pulmonary:     Effort: Pulmonary effort is normal.  Neurological:     Mental Status: She is alert and oriented to person, place, and time. Mental status is at baseline.  Psychiatric:        Mood and Affect: Mood normal.        Behavior: Behavior normal.      No results found for any visits on 08/11/23.  Assessment & Plan    Essential hypertension -     Blood Pressure Monitor; 1 each by Does not apply route once for 1 dose.  Dispense: 1 each; Refill: 0  Nausea -     Ondansetron  HCl; Take 1 tablet (4 mg total) by mouth every 8 (eight) hours as needed for nausea or vomiting.  Dispense: 40 tablet; Refill: 1 -     Famotidine ; Take 1 tablet (20 mg total) by mouth daily as needed for heartburn  or indigestion.  Dispense: 30 tablet; Refill: 1  Gastroesophageal reflux disease without esophagitis -     Omeprazole ; Take 1 capsule (20 mg total) by mouth daily.  Dispense: 90 capsule; Refill: 3 -     Famotidine ; Take 1 tablet (20 mg total) by mouth daily as needed for heartburn or indigestion.  Dispense: 30 tablet; Refill: 1  Moderate recurrent major depression (HCC) -     Sertraline  HCl; Take 0.5 tablets (25 mg total) by mouth daily for 8 days, THEN 1 tablet (50 mg total) daily.  Dispense: 30 tablet; Refill: 1  Sleep disturbance -     Sertraline  HCl; Take  0.5 tablets (25 mg total) by mouth daily for 8 days, THEN 1 tablet (50 mg total) daily.  Dispense: 30 tablet; Refill: 1  Seasonal allergies -     Cetirizine  HCl; Take 1 tablet (10 mg total) by mouth at bedtime.  Dispense: 90 tablet; Refill: 3  Borderline hypertension     Hypertension Hypertension, currently managed with diet and exercise. Not on antihypertensive medication. Low-dose antihypertensive discussed. - Explore options for obtaining a blood pressure cuff for home monitoring.  Gave patient a prescription for a home blood pressure monitoring kit, which she is to take to her pharmacy and/or a medical supply store. - Patient to check home blood pressures and bring log and cuff to next visit. - Consider starting a low-dose antihypertensive medication at that time.  Nausea Nausea persisting for three weeks, possibly related to medication or reflux. Reflux suspected as a potential cause. - Prescribe increased quantity of Zofran . - Prescribe famotidine  to assess its effect on nausea. - Continue omeprazole  daily. - Monitor dietary intake for potential reflux triggers.  Rheumatoid Arthritis Rheumatoid arthritis with hand pain. Managed with hydroxychloroquine , prednisone , and ibuprofen . Scheduled rheumatology follow-up. - Continue hydroxychloroquine  and prednisone  as prescribed. - Continue ibuprofen  as needed for pain. -  Follow up with rheumatology for further management.  Defer to specialist management.  Depression Discussed that patient's scores still indicate presence of depression.  Discussed that, despite likely contribution of current circumstances, I would recommend going ahead and starting a low-dose antidepressant to help with her mood, as well as her sleep difficulties and potentially menopause-related vasomotor symptoms.  Sleep disturbance Difficulty sleeping, possibly related to stress and anxiety. Discussed SSRI for mood and menopause symptoms. - Discuss starting sertraline  to address mood and improve sleep.  Seasonal allergies Allergic symptoms with intermittent ear and sinus discomfort, likely exacerbated by seasonal allergies. Advised to switch to daily antihistamine. - Prescribe Zyrtec  to be taken daily at night. - Monitor for dryness and adjust frequency if needed.  General Health Maintenance Due for second shingles vaccine. - Defer second shingles vaccine due to patient recently starting prednisone .    Follow-up Return in four to six weeks to assess treatment plan effectiveness. - Schedule follow-up appointment in four to six weeks.  Return in about 5 weeks (around 09/15/2023) for Anx/Dep, sleep, HTN recheck.      I discussed the assessment and treatment plan with the patient  The patient was provided an opportunity to ask questions and all were answered. The patient agreed with the plan and demonstrated an understanding of the instructions.   The patient was advised to call back or seek an in-person evaluation if the symptoms worsen or if the condition fails to improve as anticipated.    LAURAINE LOISE BUOY, DO  [facility] [phone] (phone) [phone] (fax)  [facility]

## 2023-08-11 NOTE — Patient Instructions (Signed)
 Check your blood pressure once daily, and any time you have concerning symptoms like headache, chest pain, dizziness, shortness of breath, or vision changes.   Our goal is less than 130/80.  To appropriately check your blood pressure, make sure you do the following:  1) Avoid caffeine, exercise, or tobacco products for 30 minutes before checking. Empty your bladder. 2) Sit with your back supported in a flat-backed chair. Rest your arm on something flat (arm of the chair, table, etc). 3) Sit still with your feet flat on the floor, resting, for at least 5 minutes.  4) Check your blood pressure. Take 1-2 readings.  5) Write down these readings and bring with you to any provider appointments.  Bring your home blood pressure machine with you to a provider's office for accuracy comparison at least once a year.   Make sure you take your blood pressure medications before you come to any office visit, even if you were asked to fast for labs.

## 2023-08-12 ENCOUNTER — Other Ambulatory Visit: Payer: Self-pay

## 2023-08-18 ENCOUNTER — Other Ambulatory Visit: Payer: Self-pay

## 2023-08-19 ENCOUNTER — Ambulatory Visit: Payer: Self-pay

## 2023-08-19 ENCOUNTER — Other Ambulatory Visit: Payer: Self-pay

## 2023-08-19 DIAGNOSIS — Z7189 Other specified counseling: Secondary | ICD-10-CM

## 2023-08-19 MED FILL — Budesonide-Formoterol Fumarate Dihyd Aerosol 160-4.5 MCG/ACT: RESPIRATORY_TRACT | 30 days supply | Qty: 10.2 | Fill #1 | Status: AC

## 2023-08-19 NOTE — Telephone Encounter (Signed)
Please see the message below and advise.

## 2023-08-19 NOTE — Telephone Encounter (Signed)
 FYI Only or Action Required?: Action required by provider: referral request.  Patient was last seen in primary care on 08/11/2023 by Donzella Lauraine SAILOR, DO.  Called Nurse Triage reporting Depression.  Symptoms began several days ago.  Interventions attempted: Nothing.  Symptoms are: gradually worsening.States her significant other died this past 2023/09/16 and is asking for a referral to e therapist.  Triage Disposition: See Physician Within 24 Hours  Patient/caregiver understands and will follow disposition?: No, wishes to speak with PCP  Copied from CRM #[id number]. Topic: Clinical - Red Word Triage >> Aug 19, 2023  9:30 AM Rosaria BRAVO wrote: Red Word that prompted transfer to Nurse Triage: Depression, seeking new therapist.   ----------------------------------------------------------------------- From previous Reason for Contact - Other: Reason for CRM: Reason for Disposition  [1] Depression AND [2] getting worse (e.g., sleeping poorly, less able to do activities of daily living)  Answer Assessment - Initial Assessment Questions 1. CONCERN: What happened that made you call today?     Depression 2. DEPRESSION SYMPTOM SCREENING: How are you feeling overall? (e.g., decreased energy, increased sleeping or difficulty sleeping, difficulty concentrating, feelings of sadness, guilt, hopelessness, or worthlessness)     sad 3. RISK OF HARM - SUICIDAL IDEATION:  Do you ever have thoughts of hurting or killing yourself?  (e.g., yes, no, no but preoccupation with thoughts about death)     I don't know 4. RISK OF HARM - HOMICIDAL IDEATION:  Do you ever have thoughts of hurting or killing someone else?  (e.g., yes, no, no but preoccupation with thoughts about death)     no 5. FUNCTIONAL IMPAIRMENT: How have things been going for you overall? Have you had more difficulty than usual doing your normal daily activities?  (e.g., better, same, worse; self-care, school, work, interactions)     Not  sleeping well, not eating 6. SUPPORT: Who is with you now? Who do you live with? Do you have family or friends who you can talk to?      No one 7. THERAPIST: Do you have a counselor or therapist? If Yes, ask: What is their name?     no 8. STRESSORS: Has there been any new stress or recent changes in your life?     Loss her SO 9. ALCOHOL USE OR SUBSTANCE USE (DRUG USE): Do you drink alcohol or use any illegal drugs?     NO 10. OTHER: Do you have any other physical symptoms right now? (e.g., fever)       No 11. PREGNANCY: Is there any chance you are pregnant? When was your last menstrual period?       No  Protocols used: Depression-A-AH

## 2023-08-22 ENCOUNTER — Other Ambulatory Visit: Payer: Self-pay

## 2023-08-25 ENCOUNTER — Other Ambulatory Visit: Payer: Self-pay

## 2023-08-25 MED FILL — Albuterol Sulfate Inhal Aero 108 MCG/ACT (90MCG Base Equiv): RESPIRATORY_TRACT | 16 days supply | Qty: 18 | Fill #4 | Status: AC

## 2023-08-28 NOTE — Congregational Nurse Program (Signed)
  Dept: [phone]   Congregational Nurse Program Note  Date of Encounter: 08/28/2023 Client to [facility] day center with request for assistance in completing her FNS benefit recertification application. Application completed. RN to mail. Stacy Gilmore BSN, RN Past Medical History: Past Medical History:  Diagnosis Date   Acute diverticulitis 06/19/2022   Allergy    Anemia    Anxiety    Aortic atherosclerosis (HCC)    Arthritis    Asthma    Cellulitis of face 11/12/2022   Cocaine use    COPD (chronic obstructive pulmonary disease) (HCC)    Coronary atherosclerosis    Depression    Diverticulitis large intestine 09/28/2022   Diverticulitis of colon 07/25/2022   Diverticulitis of intestine with abscess, recurrent 06/19/2022   GERD (gastroesophageal reflux disease)    History of diverticulitis    Hypertension    Incisional hernia, without obstruction or gangrene 01/28/2023   Osteoporosis     Encounter Details:  Community Questionnaire - 08/28/23 1242       Questionnaire   Ask client: Do you give verbal consent for me to treat you today? Yes    Student Assistance N/A    Location Patient Served  [facility]    Encounter Setting CN site    Population Status Unknown   client now has a boarding room   Insurance Medicaid   Client now has [place] [employer] Medicaid   Insurance/Financial Assistance Referral N/A    Medication N/A   medications provided through [facility]   Medical Provider Yes    Screening Referrals Made N/A    Medical Referrals Made N/A   [facility] practice   Medical Appointment Completed N/A    CNP Interventions Advocate/Support;Case Management    Screenings CN Performed N/A    ED Visit Averted N/A   no ED visit needed   Life-Saving Intervention Made N/A

## 2023-09-11 ENCOUNTER — Other Ambulatory Visit: Payer: Self-pay

## 2023-09-11 MED FILL — Budesonide-Formoterol Fumarate Dihyd Aerosol 160-4.5 MCG/ACT: RESPIRATORY_TRACT | 30 days supply | Qty: 10.2 | Fill #2 | Status: AC

## 2023-09-11 MED FILL — Albuterol Sulfate Inhal Aero 108 MCG/ACT (90MCG Base Equiv): RESPIRATORY_TRACT | 16 days supply | Qty: 18 | Fill #5 | Status: AC

## 2023-09-12 ENCOUNTER — Other Ambulatory Visit: Payer: Self-pay

## 2023-09-16 ENCOUNTER — Other Ambulatory Visit: Payer: Self-pay

## 2023-09-17 ENCOUNTER — Other Ambulatory Visit: Payer: Self-pay

## 2023-09-18 ENCOUNTER — Other Ambulatory Visit: Payer: Self-pay

## 2023-09-19 ENCOUNTER — Other Ambulatory Visit: Payer: Self-pay

## 2023-09-19 ENCOUNTER — Other Ambulatory Visit: Payer: Self-pay | Admitting: Family Medicine

## 2023-09-19 ENCOUNTER — Ambulatory Visit: Admitting: Family Medicine

## 2023-09-19 NOTE — Telephone Encounter (Signed)
 Copied from CRM [id number]. Topic: Clinical - Medication Refill >> Sep 19, 2023  3:13 PM Zebedee SAUNDERS wrote: Medication: ibuprofen  (ADVIL ) 800 MG tablet  Has the patient contacted their pharmacy? Yes (Agent: If no, request that the patient contact the pharmacy for the refill. If patient does not wish to contact the pharmacy document the reason why and proceed with request.) (Agent: If yes, when and what did the pharmacy advise?)  This is the patient's preferred pharmacy:  [facility] [address] [place] [address] Phone: [phone] Fax: [phone]  Is this the correct pharmacy for this prescription? Yes If no, delete pharmacy and type the correct one.   Has the prescription been filled recently? Yes  Is the patient out of the medication? Yes  Has the patient been seen for an appointment in the last year OR does the patient have an upcoming appointment? Yes  Can we respond through MyChart? Yes  Agent: Please be advised that Rx refills may take up to 3 business days. We ask that you follow-up with your pharmacy.

## 2023-09-22 ENCOUNTER — Other Ambulatory Visit: Payer: Self-pay

## 2023-09-22 ENCOUNTER — Ambulatory Visit: Admitting: Family Medicine

## 2023-09-22 ENCOUNTER — Encounter: Payer: Self-pay | Admitting: Family Medicine

## 2023-09-22 VITALS — BP 119/78 | HR 78 | Ht 65.0 in | Wt 159.0 lb

## 2023-09-22 DIAGNOSIS — G479 Sleep disorder, unspecified: Secondary | ICD-10-CM | POA: Diagnosis not present

## 2023-09-22 DIAGNOSIS — R5382 Chronic fatigue, unspecified: Secondary | ICD-10-CM

## 2023-09-22 DIAGNOSIS — G8929 Other chronic pain: Secondary | ICD-10-CM

## 2023-09-22 DIAGNOSIS — F331 Major depressive disorder, recurrent, moderate: Secondary | ICD-10-CM

## 2023-09-22 DIAGNOSIS — Z23 Encounter for immunization: Secondary | ICD-10-CM | POA: Diagnosis not present

## 2023-09-22 DIAGNOSIS — M2550 Pain in unspecified joint: Secondary | ICD-10-CM | POA: Diagnosis not present

## 2023-09-22 MED ORDER — MELOXICAM 15 MG PO TABS
15.0000 mg | ORAL_TABLET | Freq: Every day | ORAL | 0 refills | Status: DC
  Filled 2023-09-22: qty 30, 30d supply, fill #0

## 2023-09-22 MED ORDER — SERTRALINE HCL 50 MG PO TABS
ORAL_TABLET | ORAL | 1 refills | Status: DC
  Filled 2023-09-22: qty 30, 34d supply, fill #0
  Filled 2023-11-06: qty 30, 30d supply, fill #0
  Filled 2023-11-06: qty 30, 30d supply, fill #1
  Filled 2023-11-17: qty 30, 30d supply, fill #0

## 2023-09-22 NOTE — Progress Notes (Signed)
 Established patient visit   Patient: Stacy Gilmore   DOB: 06-17-1960   63 y.o. Female  MRN: [id number] Visit Date: 09/22/2023  Today's healthcare provider: LAURAINE LOISE BUOY, DO   Chief Complaint  Patient presents with   Sleep     Need a referral for a sleep study    Subjective    HPI KHAMORA KARAN is a 63 year old female with rheumatoid arthritis who presents with sleep disturbances and medication management.  She experiences significant sleep disturbances, characterized by alternating periods of insomnia and hypersomnia. Recently, she slept an entire day and woke up disoriented about the day of the week. She is unsure if she currently snores but recalls being told she used to snore.  She is experiencing symptoms of a cold, including sneezing, coughing, and rhinorrhea.   She has been recently approved for disability and is awaiting her checks to manage her rent and bills. Her landlord has been accommodating during this period.  She is on a new medication regimen for rheumatoid arthritis, which includes a self-injection every two weeks. She recently administered her first dose. She continues to take hydroxychloroquine  and uses ibuprofen  as needed for pain in her hands and knees, typically once every two days, but more frequently due to recent weather changes. She reports taking up to three ibuprofen  per day recently, which has not caused gastrointestinal issues. She has a history of being taken off prednisone  and has not tried meloxicam  or celecoxib . She is scheduled to see her rheumatologist on December 2nd.  She has not started taking sertraline  for mood, although it was previously prescribed. She wants to try it to help with her mood.  She is adjusting to the recent loss of her partner, who passed away on 2025/08/10after 30 years together. She is managing her living situation with the help of her landlord.       Medications: Outpatient Medications Prior to Visit   Medication Sig   HADLIMA PUSHTOUCH 40 MG/0.4ML SOAJ Inject 0.4 mLs into the skin every 14 (fourteen) days.   albuterol  (VENTOLIN  HFA) 108 (90 Base) MCG/ACT inhaler Inhale 2 puffs into the lungs every 4 (four) hours as needed for wheezing or shortness of breath.   atorvastatin  (LIPITOR) 10 MG tablet Take 1 tablet (10 mg total) by mouth daily.   budesonide -formoterol  (SYMBICORT ) 160-4.5 MCG/ACT inhaler Inhale 2 puffs into the lungs 2 (two) times daily.   cetirizine  (ZYRTEC ) 10 MG tablet Take 1 tablet (10 mg total) by mouth at bedtime.   famotidine  (PEPCID ) 20 MG tablet Take 1 tablet (20 mg total) by mouth daily as needed for heartburn or indigestion.   hydroxychloroquine  (PLAQUENIL ) 200 MG tablet Take 1 tablet (200 mg total) by mouth 2 (two) times daily.   omeprazole  (PRILOSEC) 20 MG capsule Take 1 capsule (20 mg total) by mouth daily.   ondansetron  (ZOFRAN ) 4 MG tablet Take 1 tablet (4 mg total) by mouth every 8 (eight) hours as needed for nausea or vomiting.   polyethylene glycol powder (GLYCOLAX /MIRALAX ) 17 GM/SCOOP powder Take 17 g by mouth daily. Mix as directed.   predniSONE  (DELTASONE ) 5 MG tablet Take 1 tablet (5 mg total) by mouth daily.   senna-docusate (SENOKOT-S) 8.6-50 MG tablet Take 1 tablet by mouth at bedtime as needed for mild constipation.   [DISCONTINUED] ibuprofen  (ADVIL ) 800 MG tablet Take 1 tablet (800 mg total) by mouth every 8 (eight) hours as needed for Pain. Take with Food   [  DISCONTINUED] sertraline  (ZOLOFT ) 50 MG tablet Take 0.5 tablets (25 mg total) by mouth daily for 8 days, THEN 1 tablet (50 mg total) daily.   No facility-administered medications prior to visit.        Objective    BP 119/78 (BP Location: Right Arm, Patient Position: Sitting, Cuff Size: Normal)   Pulse 78   Ht 5' 5 (1.651 m)   Wt 159 lb (72.1 kg)   SpO2 97%   BMI 26.46 kg/m     Physical Exam Vitals and nursing note reviewed.  Constitutional:      General: She is not in acute  distress.    Appearance: Normal appearance.  HENT:     Head: Normocephalic and atraumatic.  Eyes:     General: No scleral icterus.    Conjunctiva/sclera: Conjunctivae normal.  Cardiovascular:     Rate and Rhythm: Normal rate.  Pulmonary:     Effort: Pulmonary effort is normal.  Neurological:     Mental Status: She is alert and oriented to person, place, and time. Mental status is at baseline.  Psychiatric:        Mood and Affect: Mood normal.        Behavior: Behavior normal.      No results found for any visits on 09/22/23.  Assessment & Plan    Restless sleeper -     Ambulatory referral to Sleep Studies  Chronic fatigue -     Ambulatory referral to Sleep Studies  Sleep disturbance -     Sertraline  HCl; Take 0.5 tablets (25 mg total) by mouth daily for 8 days, THEN 1 tablet (50 mg total) daily.  Dispense: 30 tablet; Refill: 1  Chronic pain of multiple joints -     Meloxicam ; Take 1 tablet (15 mg total) by mouth daily.  Dispense: 30 tablet; Refill: 0  Moderate recurrent major depression (HCC) -     Sertraline  HCl; Take 0.5 tablets (25 mg total) by mouth daily for 8 days, THEN 1 tablet (50 mg total) daily.  Dispense: 30 tablet; Refill: 1  Needs flu shot -     Flu vaccine trivalent PF, 6mos and older(Flulaval,Afluria,Fluarix,Fluzone )  Need for shingles vaccine -     Varicella-zoster vaccine IM    Sleep disturbance; restless sleeper; chronic fatigue Reports insomnia and hypersomnia. Partner previously noted snoring. Current cold symptoms may affect sleep.  ESS score 11 - Refer for sleep study to evaluate sleep patterns and potential sleep apnea.  Rheumatoid arthritis Started adalimumab last week. Continues hydroxychloroquine . Effectiveness of adalimumab not yet determined. - Continue adalimumab injections every two weeks. - Continue hydroxychloroquine  as prescribed. - Follow up with rheumatologist on December 2nd. Defer to specialist management.  Chronic pain in  hands and knees Chronic pain exacerbated by weather. Previously on high-dose ibuprofen , posing GI and renal risks. Discussed safer alternatives. - Prescribe meloxicam  once daily as an alternative to ibuprofen . - Advise against concurrent use of ibuprofen , Aleve , or aspirin with meloxicam .  General Health Maintenance Due for shingles and flu vaccinations. RSV and COVID-19 vaccines not available at clinic. - Administer shingles and flu vaccinations. - Advise to obtain RSV and COVID-19 vaccinations at pharmacy.  Depression  Patient reports ongoing depression and would still like to start medications for it; she never received the sertraline  previously. Will resend prescription for sertraline  to [facility] pharmacy.   Follow-Up Plans for follow-up with rheumatologist and pharmacy for medication management. - Follow up with rheumatologist on December 2nd.  Return in about 6  weeks (around 11/03/2023) for Anx/Dep.      I discussed the assessment and treatment plan with the patient  The patient was provided an opportunity to ask questions and all were answered. The patient agreed with the plan and demonstrated an understanding of the instructions.   The patient was advised to call back or seek an in-person evaluation if the symptoms worsen or if the condition fails to improve as anticipated.    LAURAINE LOISE BUOY, DO  [facility] [phone] (phone) [phone] (fax)  [facility]

## 2023-09-24 ENCOUNTER — Encounter: Payer: Self-pay | Admitting: Sleep Medicine

## 2023-09-26 ENCOUNTER — Telehealth: Payer: Self-pay | Admitting: Acute Care

## 2023-09-26 NOTE — Telephone Encounter (Signed)
 Returned call but no answer. Left message.  Unsure of reason for call. No referral to lung screening noted

## 2023-09-27 ENCOUNTER — Other Ambulatory Visit: Payer: Self-pay

## 2023-10-14 ENCOUNTER — Other Ambulatory Visit: Payer: Self-pay

## 2023-10-14 ENCOUNTER — Emergency Department

## 2023-10-14 ENCOUNTER — Encounter: Payer: Self-pay | Admitting: Emergency Medicine

## 2023-10-14 ENCOUNTER — Emergency Department
Admission: EM | Admit: 2023-10-14 | Discharge: 2023-10-14 | Disposition: A | Discharge status: Home / Self Care | Attending: Emergency Medicine | Admitting: Emergency Medicine

## 2023-10-14 DIAGNOSIS — R21 Rash and other nonspecific skin eruption: Secondary | ICD-10-CM | POA: Insufficient documentation

## 2023-10-14 HISTORY — DX: Other psychoactive substance abuse, uncomplicated: F19.10

## 2023-10-14 MED ORDER — CEPHALEXIN 500 MG PO CAPS
500.0000 mg | ORAL_CAPSULE | Freq: Two times a day (BID) | ORAL | 0 refills | Status: AC
  Filled 2023-10-14: qty 10, 5d supply, fill #0

## 2023-10-14 MED ORDER — CEPHALEXIN 500 MG PO CAPS
500.0000 mg | ORAL_CAPSULE | Freq: Once | ORAL | Status: AC
  Administered 2023-10-14: 500 mg via ORAL
  Filled 2023-10-14: qty 1

## 2023-10-14 MED ORDER — IBUPROFEN 600 MG PO TABS
600.0000 mg | ORAL_TABLET | Freq: Once | ORAL | Status: AC
  Administered 2023-10-14: 600 mg via ORAL
  Filled 2023-10-14: qty 1

## 2023-10-14 MED ORDER — ACETAMINOPHEN 500 MG PO TABS
1000.0000 mg | ORAL_TABLET | Freq: Once | ORAL | Status: AC
  Administered 2023-10-14: 1000 mg via ORAL
  Filled 2023-10-14: qty 2

## 2023-10-14 NOTE — ED Provider Notes (Addendum)
 [facility] Provider Note    Event Date/Time   First MD Initiated Contact with Patient 10/14/23 [phone]     (approximate)   History   Insect Bite   HPI  Stacy Gilmore is a 63 y.o. female past medical history significant for substance use, who presents to the emergency department with concern for an insect bite.  States that she was going to urgent care today because her left ankle was bothering her.  Then on the way to urgent care noted that her right ankle was bothering her.  She believes that she has bug bites for the past couple of weeks.  States that where she is staying has bugs and she has been trying to show them that there is bugs there.  Does endorse smoking crack cocaine.  States that she has resources and she is going to go to [employer].  Denies any fever or chills.  Denies any falls or trauma.  Adamantly denies injecting any drugs and states that she only smokes cocaine intermittently.     Physical Exam   Triage Vital Signs: ED Triage Vitals  Encounter Vitals Group     BP 10/14/23 0603 (!) 150/88     Girls Systolic BP Percentile --      Girls Diastolic BP Percentile --      Boys Systolic BP Percentile --      Boys Diastolic BP Percentile --      Pulse Rate 10/14/23 0603 94     Resp 10/14/23 0603 19     Temp 10/14/23 0603 98.2 F (36.8 C)     Temp Source 10/14/23 0603 Oral     SpO2 10/14/23 0559 98 %     Weight 10/14/23 0603 158 lb 11.7 oz (72 kg)     Height 10/14/23 0603 5' 5 (1.651 m)     Head Circumference --      Peak Flow --      Pain Score 10/14/23 0603 7     Pain Loc --      Pain Education --      Exclude from Growth Chart --     Most recent vital signs: Vitals:   10/14/23 0559 10/14/23 0603  BP:  (!) 150/88  Pulse:  94  Resp:  19  Temp:  98.2 F (36.8 C)  SpO2: 98% 97%    Physical Exam Constitutional:      Appearance: She is well-developed.     Comments: Tearful  HENT:     Head: Atraumatic.  Eyes:      Conjunctiva/sclera: Conjunctivae normal.  Cardiovascular:     Rate and Rhythm: Regular rhythm.  Pulmonary:     Effort: No respiratory distress.  Abdominal:     General: There is no distension.     Tenderness: There is no abdominal tenderness.  Musculoskeletal:        General: Normal range of motion.     Cervical back: Normal range of motion.  Skin:    General: Skin is warm.     Findings: Rash present.     Comments: Left ankle was unwrapped with no underlying erythema warmth or induration.  No obvious bites.  Right ankle with mild erythema and warmth to the posterior aspect of the right ankle.  Able to range the right ankle.  +2 DP pulses that are equal bilaterally.  No significant tenderness to palpation to the lower leg or knee.  Neurological:     Mental Status: She is  alert. Mental status is at baseline.  Psychiatric:        Speech: Speech is rapid and pressured.      IMPRESSION / MDM / ASSESSMENT AND PLAN / ED COURSE  I reviewed the triage vital signs and the nursing notes.  Differential diagnosis including substance use, insect bite, cellulitis.  Denies any falls or trauma, have low suspicion for fracture.  Able to range the ankle and have a low suspicion for septic joint, afebrile.  Has good peripheral pulses.  No crepitus on exam and have low concern for necrotizing soft tissue infection.  X-ray with no signs of gas.  Questionably small amount of cellulitis given some warmth to her ankle.  Able to range the ankle so doubt septic joint.  No obvious murmur on exam and is afebrile, have a lower suspicion for endocarditis given that she adamantly denies injecting any drugs.   Labs (all labs ordered are listed, but only abnormal results are displayed) Labs interpreted as -    Labs Reviewed - No data to display  Given Tylenol  and Motrin  for pain control.  Will start on Keflex to cover for possible cellulitis.  Called in antibiotics to her pharmacy at this hospital so she can pick  them up before leaving.  Given information for outpatient resources for rehab.  Informed the patient that she could go to [facility] just from the emergency department.  Patient does follow with a primary care provider.  Discussed at length that she needed to return to the emergency department for any worsening symptoms or signs of infection.  No questions at time of discharge.  Patient states that she would return if her symptoms did not improve in the next 2 days or if her symptoms worsen she would return sooner.     PROCEDURES:  Critical Care performed: No  Procedures  Patient's presentation is most consistent with acute complicated illness / injury requiring diagnostic workup.   MEDICATIONS ORDERED IN ED: Medications  acetaminophen  (TYLENOL ) tablet 1,000 mg (1,000 mg Oral Given 10/14/23 0749)  cephALEXin (KEFLEX) capsule 500 mg (500 mg Oral Given 10/14/23 0749)  ibuprofen  (ADVIL ) tablet 600 mg (600 mg Oral Given 10/14/23 0749)    FINAL CLINICAL IMPRESSION(S) / ED DIAGNOSES   Final diagnoses:  Rash     Rx / DC Orders   ED Discharge Orders          Ordered    cephALEXin (KEFLEX) 500 MG capsule  2 times daily        10/14/23 0825             Note:  This document was prepared using Dragon voice recognition software and may include unintentional dictation errors.   Suzanne Kirsch, MD 10/14/23 [id number]    Suzanne Kirsch, MD 10/14/23 [phone]

## 2023-10-14 NOTE — Discharge Instructions (Signed)
 You were seen in the emergency department for a rash and ankle pain.  You can alternate Motrin  and Tylenol  for pain control.  If you have any worsening pain or worsening signs of a rash or high fever it is important he return immediately to the emergency department for reevaluation as you may need to be started on antibiotics.  It is importantly follow-up at [facility] and get help for your substance use.  Avoid smoking crack cocaine.  Return for any ongoing or worsening symptoms.  Pain control:  Ibuprofen  (motrin /aleve /advil ) - You can take 3 tablets (600 mg) every 6 hours as needed for pain/fever.  Acetaminophen  (tylenol ) - You can take 2 extra strength tablets (1000 mg) every 6 hours as needed for pain/fever.  You can alternate these medications or take them together.  Make sure you eat food/drink water when taking these medications.

## 2023-10-14 NOTE — ED Triage Notes (Signed)
 Pt arrived via [facility] from local boarding house infested with roaches. Pt c/o bilateral ankle bug bites x1.5 weeks. Pt having a hard time sitting still and when asked about drug or alcohol use, pt sts, I smoked some crack.

## 2023-10-14 NOTE — ED Notes (Signed)
 See triage note  Presents with possible insect bites to both feet/ankles   States she felt this early this am  Afebrile on arrival

## 2023-10-17 ENCOUNTER — Other Ambulatory Visit: Payer: Self-pay

## 2023-10-23 ENCOUNTER — Ambulatory Visit: Admitting: Infectious Diseases

## 2023-11-04 ENCOUNTER — Ambulatory Visit: Admitting: Infectious Diseases

## 2023-11-06 ENCOUNTER — Other Ambulatory Visit: Payer: Self-pay

## 2023-11-06 ENCOUNTER — Other Ambulatory Visit: Payer: Self-pay | Admitting: Family Medicine

## 2023-11-06 ENCOUNTER — Other Ambulatory Visit (HOSPITAL_COMMUNITY): Payer: Self-pay

## 2023-11-06 DIAGNOSIS — K219 Gastro-esophageal reflux disease without esophagitis: Secondary | ICD-10-CM

## 2023-11-06 DIAGNOSIS — R110 Nausea: Secondary | ICD-10-CM

## 2023-11-06 DIAGNOSIS — J439 Emphysema, unspecified: Secondary | ICD-10-CM

## 2023-11-06 DIAGNOSIS — G8929 Other chronic pain: Secondary | ICD-10-CM

## 2023-11-07 ENCOUNTER — Other Ambulatory Visit (HOSPITAL_COMMUNITY): Payer: Self-pay

## 2023-11-07 MED ORDER — FAMOTIDINE 20 MG PO TABS
20.0000 mg | ORAL_TABLET | Freq: Every day | ORAL | 1 refills | Status: DC | PRN
  Filled 2023-11-07: qty 90, 90d supply, fill #0

## 2023-11-07 MED ORDER — ALBUTEROL SULFATE HFA 108 (90 BASE) MCG/ACT IN AERS
2.0000 | INHALATION_SPRAY | RESPIRATORY_TRACT | 5 refills | Status: AC | PRN
  Filled 2023-11-07: qty 6.7, 17d supply, fill #0

## 2023-11-07 MED ORDER — MELOXICAM 15 MG PO TABS
15.0000 mg | ORAL_TABLET | Freq: Every day | ORAL | 1 refills | Status: DC
  Filled 2023-11-07: qty 90, 90d supply, fill #0

## 2023-11-07 MED ORDER — BUDESONIDE-FORMOTEROL FUMARATE 160-4.5 MCG/ACT IN AERO
2.0000 | INHALATION_SPRAY | Freq: Two times a day (BID) | RESPIRATORY_TRACT | 5 refills | Status: AC
  Filled 2023-11-07: qty 10.2, 30d supply, fill #0

## 2023-12-03 ENCOUNTER — Other Ambulatory Visit: Payer: Self-pay

## 2023-12-10 ENCOUNTER — Other Ambulatory Visit: Payer: Self-pay | Admitting: Family Medicine

## 2023-12-10 ENCOUNTER — Other Ambulatory Visit (HOSPITAL_COMMUNITY): Payer: Self-pay

## 2023-12-10 DIAGNOSIS — G479 Sleep disorder, unspecified: Secondary | ICD-10-CM

## 2023-12-10 DIAGNOSIS — I700 Atherosclerosis of aorta: Secondary | ICD-10-CM

## 2023-12-10 DIAGNOSIS — K219 Gastro-esophageal reflux disease without esophagitis: Secondary | ICD-10-CM

## 2023-12-10 DIAGNOSIS — F331 Major depressive disorder, recurrent, moderate: Secondary | ICD-10-CM

## 2023-12-10 DIAGNOSIS — J439 Emphysema, unspecified: Secondary | ICD-10-CM

## 2023-12-10 DIAGNOSIS — R110 Nausea: Secondary | ICD-10-CM

## 2023-12-10 DIAGNOSIS — J302 Other seasonal allergic rhinitis: Secondary | ICD-10-CM

## 2023-12-10 DIAGNOSIS — G8929 Other chronic pain: Secondary | ICD-10-CM

## 2023-12-12 ENCOUNTER — Other Ambulatory Visit (HOSPITAL_COMMUNITY): Payer: Self-pay

## 2023-12-15 ENCOUNTER — Other Ambulatory Visit: Payer: Self-pay

## 2023-12-16 NOTE — Telephone Encounter (Signed)
 Please review in provider's absence.  All are requesting a new prescription.  Thank you.  LOV- 09/22/2023 NOV- None [facility]- 08/11/2023 Omeprazole  20 mg 90 x 3  08/11/2023 Ondansetron  4 mg 90 x 3  11/07/2023 Meloxicam  15 mg 90 x 1  09/22/2023 Sertraline  50 mg 30 x 1 Duration 68 days  11/07/2023 Famotidine  20 mg 90 x 1

## 2023-12-17 ENCOUNTER — Other Ambulatory Visit: Payer: Self-pay

## 2023-12-17 ENCOUNTER — Telehealth: Payer: Self-pay | Admitting: Family Medicine

## 2023-12-17 NOTE — Telephone Encounter (Signed)
 error

## 2023-12-17 NOTE — Telephone Encounter (Signed)
 Copied from CRM [id number]. Topic: Clinical - Medication Question >> Dec 17, 2023  1:24 PM Rachelle R wrote: Reason for CRM: Patient was previously prescribed ibuprofen  (ADVIL ) 800 MG tablet. States she just took her last one and is requesting to see if Dr Donzella would be able to refill it.  Patient can be reached [phone]

## 2023-12-17 NOTE — Telephone Encounter (Signed)
 Converted into a refill request

## 2023-12-17 NOTE — Telephone Encounter (Signed)
 Per Dr. Greggory recommendations, I recommend patient take meloxicam  instead of the ibuprofen .

## 2023-12-18 NOTE — Telephone Encounter (Signed)
 Called patient x 2, both times when I spoke she hung up.
